# Patient Record
Sex: Male | Born: 1965 | Race: Black or African American | Hispanic: No | Marital: Single | Smoking: Light tobacco smoker
Health system: Southern US, Community
[De-identification: ages and names within clinical notes are randomized; demographics above are authoritative.]

## PROBLEM LIST (undated history)

## (undated) DIAGNOSIS — E119 Type 2 diabetes mellitus without complications: Secondary | ICD-10-CM

## (undated) DIAGNOSIS — H9191 Unspecified hearing loss, right ear: Secondary | ICD-10-CM

## (undated) DIAGNOSIS — J439 Emphysema, unspecified: Secondary | ICD-10-CM

## (undated) DIAGNOSIS — I639 Cerebral infarction, unspecified: Secondary | ICD-10-CM

## (undated) DIAGNOSIS — I2510 Atherosclerotic heart disease of native coronary artery without angina pectoris: Secondary | ICD-10-CM

## (undated) DIAGNOSIS — B0689 Other rubella complications: Secondary | ICD-10-CM

## (undated) DIAGNOSIS — I219 Acute myocardial infarction, unspecified: Secondary | ICD-10-CM

## (undated) DIAGNOSIS — Z720 Tobacco use: Secondary | ICD-10-CM

## (undated) DIAGNOSIS — R911 Solitary pulmonary nodule: Secondary | ICD-10-CM

## (undated) DIAGNOSIS — M1990 Unspecified osteoarthritis, unspecified site: Secondary | ICD-10-CM

## (undated) DIAGNOSIS — E785 Hyperlipidemia, unspecified: Secondary | ICD-10-CM

## (undated) HISTORY — DX: Solitary pulmonary nodule: R91.1

## (undated) HISTORY — DX: Emphysema, unspecified: J43.9

## (undated) HISTORY — DX: Tobacco use: Z72.0

## (undated) HISTORY — DX: Hyperlipidemia, unspecified: E78.5

---
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
## 2012-10-02 HISTORY — PX: NECK SURGERY: SHX720

## 2012-11-02 HISTORY — PX: CARDIAC CATHETERIZATION: SHX172

## 2013-01-20 DIAGNOSIS — I2510 Atherosclerotic heart disease of native coronary artery without angina pectoris: Secondary | ICD-10-CM | POA: Diagnosis not present

## 2013-01-20 DIAGNOSIS — R079 Chest pain, unspecified: Secondary | ICD-10-CM | POA: Diagnosis not present

## 2013-01-20 DIAGNOSIS — E119 Type 2 diabetes mellitus without complications: Secondary | ICD-10-CM | POA: Diagnosis not present

## 2013-01-20 DIAGNOSIS — I219 Acute myocardial infarction, unspecified: Secondary | ICD-10-CM | POA: Diagnosis not present

## 2013-01-27 DIAGNOSIS — M1710 Unilateral primary osteoarthritis, unspecified knee: Secondary | ICD-10-CM | POA: Diagnosis not present

## 2013-02-25 DIAGNOSIS — M1710 Unilateral primary osteoarthritis, unspecified knee: Secondary | ICD-10-CM | POA: Diagnosis not present

## 2013-03-20 DIAGNOSIS — M129 Arthropathy, unspecified: Secondary | ICD-10-CM | POA: Diagnosis not present

## 2013-03-20 DIAGNOSIS — F17200 Nicotine dependence, unspecified, uncomplicated: Secondary | ICD-10-CM | POA: Diagnosis not present

## 2013-03-20 DIAGNOSIS — E119 Type 2 diabetes mellitus without complications: Secondary | ICD-10-CM | POA: Diagnosis not present

## 2013-03-20 DIAGNOSIS — I2510 Atherosclerotic heart disease of native coronary artery without angina pectoris: Secondary | ICD-10-CM | POA: Diagnosis not present

## 2013-04-08 DIAGNOSIS — M4802 Spinal stenosis, cervical region: Secondary | ICD-10-CM | POA: Diagnosis not present

## 2013-05-02 DIAGNOSIS — L259 Unspecified contact dermatitis, unspecified cause: Secondary | ICD-10-CM | POA: Diagnosis not present

## 2013-05-02 DIAGNOSIS — K5289 Other specified noninfective gastroenteritis and colitis: Secondary | ICD-10-CM | POA: Diagnosis not present

## 2013-05-02 DIAGNOSIS — Z9861 Coronary angioplasty status: Secondary | ICD-10-CM | POA: Diagnosis not present

## 2013-05-02 DIAGNOSIS — E119 Type 2 diabetes mellitus without complications: Secondary | ICD-10-CM | POA: Diagnosis not present

## 2013-05-02 DIAGNOSIS — I2510 Atherosclerotic heart disease of native coronary artery without angina pectoris: Secondary | ICD-10-CM | POA: Diagnosis not present

## 2013-07-21 DIAGNOSIS — I2510 Atherosclerotic heart disease of native coronary artery without angina pectoris: Secondary | ICD-10-CM | POA: Diagnosis not present

## 2013-07-21 DIAGNOSIS — I252 Old myocardial infarction: Secondary | ICD-10-CM | POA: Diagnosis not present

## 2013-07-21 DIAGNOSIS — E119 Type 2 diabetes mellitus without complications: Secondary | ICD-10-CM | POA: Diagnosis not present

## 2013-07-21 DIAGNOSIS — E782 Mixed hyperlipidemia: Secondary | ICD-10-CM | POA: Diagnosis not present

## 2013-08-14 DIAGNOSIS — M4802 Spinal stenosis, cervical region: Secondary | ICD-10-CM | POA: Diagnosis not present

## 2013-08-14 DIAGNOSIS — M5020 Other cervical disc displacement, unspecified cervical region: Secondary | ICD-10-CM | POA: Diagnosis not present

## 2013-10-23 DIAGNOSIS — E782 Mixed hyperlipidemia: Secondary | ICD-10-CM | POA: Diagnosis not present

## 2013-10-23 DIAGNOSIS — I2510 Atherosclerotic heart disease of native coronary artery without angina pectoris: Secondary | ICD-10-CM | POA: Diagnosis not present

## 2013-10-23 DIAGNOSIS — E119 Type 2 diabetes mellitus without complications: Secondary | ICD-10-CM | POA: Diagnosis not present

## 2013-10-23 DIAGNOSIS — R079 Chest pain, unspecified: Secondary | ICD-10-CM | POA: Diagnosis not present

## 2013-11-04 DIAGNOSIS — E119 Type 2 diabetes mellitus without complications: Secondary | ICD-10-CM | POA: Diagnosis not present

## 2013-11-04 DIAGNOSIS — R079 Chest pain, unspecified: Secondary | ICD-10-CM | POA: Diagnosis not present

## 2013-11-04 DIAGNOSIS — E782 Mixed hyperlipidemia: Secondary | ICD-10-CM | POA: Diagnosis not present

## 2013-11-04 DIAGNOSIS — I2510 Atherosclerotic heart disease of native coronary artery without angina pectoris: Secondary | ICD-10-CM | POA: Diagnosis not present

## 2013-11-06 DIAGNOSIS — I2510 Atherosclerotic heart disease of native coronary artery without angina pectoris: Secondary | ICD-10-CM | POA: Diagnosis not present

## 2013-11-06 DIAGNOSIS — E118 Type 2 diabetes mellitus with unspecified complications: Secondary | ICD-10-CM | POA: Diagnosis not present

## 2013-11-06 DIAGNOSIS — E119 Type 2 diabetes mellitus without complications: Secondary | ICD-10-CM | POA: Diagnosis not present

## 2013-11-06 DIAGNOSIS — Z23 Encounter for immunization: Secondary | ICD-10-CM | POA: Diagnosis not present

## 2013-11-06 DIAGNOSIS — Z9861 Coronary angioplasty status: Secondary | ICD-10-CM | POA: Diagnosis not present

## 2013-11-11 DIAGNOSIS — E782 Mixed hyperlipidemia: Secondary | ICD-10-CM | POA: Diagnosis not present

## 2013-11-11 DIAGNOSIS — E119 Type 2 diabetes mellitus without complications: Secondary | ICD-10-CM | POA: Diagnosis not present

## 2013-11-11 DIAGNOSIS — I252 Old myocardial infarction: Secondary | ICD-10-CM | POA: Diagnosis not present

## 2013-11-11 DIAGNOSIS — I2510 Atherosclerotic heart disease of native coronary artery without angina pectoris: Secondary | ICD-10-CM | POA: Diagnosis not present

## 2013-12-11 DIAGNOSIS — M4802 Spinal stenosis, cervical region: Secondary | ICD-10-CM | POA: Diagnosis not present

## 2014-02-13 DIAGNOSIS — M1712 Unilateral primary osteoarthritis, left knee: Secondary | ICD-10-CM | POA: Diagnosis not present

## 2014-02-24 ENCOUNTER — Other Ambulatory Visit (HOSPITAL_COMMUNITY): Payer: Self-pay | Admitting: Orthopedic Surgery

## 2014-03-03 DIAGNOSIS — J439 Emphysema, unspecified: Secondary | ICD-10-CM

## 2014-03-03 DIAGNOSIS — R911 Solitary pulmonary nodule: Secondary | ICD-10-CM

## 2014-03-03 HISTORY — DX: Solitary pulmonary nodule: R91.1

## 2014-03-03 HISTORY — DX: Emphysema, unspecified: J43.9

## 2014-03-04 ENCOUNTER — Other Ambulatory Visit (HOSPITAL_COMMUNITY): Payer: Self-pay | Admitting: *Deleted

## 2014-03-04 NOTE — Pre-Procedure Instructions (Addendum)
Alan Mendoza  03/04/2014   Your procedure is scheduled on:  Tuesday, March 17, 2014 at 1:05 PM.   Report to [facility] Entrance "A" Admitting Office at 11:00 AM.   Call this number if you have problems the morning of surgery: [phone]               Any questions prior to day of surgery, please call [phone] between 8 & 4 PM.   Remember:   Do not eat food or drink liquids after midnight Monday, 03/16/14.   Take these medicines the morning of surgery with A SIP OF WATER: Gabapentin.  Stop Aspirin 03/10/14   Do not wear jewelry.  Do not wear lotions, powders, or cologne. You may wear deodorant.  Men may shave face and neck.  Do not bring valuables to the hospital.  [facility] is not responsible                  for any belongings or valuables.               Contacts, dentures or bridgework may not be worn into surgery.  Leave suitcase in the car. After surgery it may be brought to your room.  For patients admitted to the hospital, discharge time is determined by your                treatment team.    Special Instructions: [place] - Preparing for Surgery  Before surgery, you can play an important role.  Because skin is not sterile, your skin needs to be as free of germs as possible.  You can reduce the number of germs on you skin by washing with CHG (chlorahexidine gluconate) soap before surgery.  CHG is an antiseptic cleaner which kills germs and bonds with the skin to continue killing germs even after washing.  Please DO NOT use if you have an allergy to CHG or antibacterial soaps.  If your skin becomes reddened/irritated stop using the CHG and inform your nurse when you arrive at Short Stay.  Do not shave (including legs and underarms) for at least 48 hours prior to the first CHG shower.  You may shave your face.  Please follow these instructions carefully:   1.  Shower with CHG Soap the night before surgery and the                                morning of  Surgery.  2.  If you choose to wash your hair, wash your hair first as usual with your       normal shampoo.  3.  After you shampoo, rinse your hair and body thoroughly to remove the                      Shampoo.  4.  Use CHG as you would any other liquid soap.  You can apply chg directly       to the skin and wash gently with scrungie or a clean washcloth.  5.  Apply the CHG Soap to your body ONLY FROM THE NECK DOWN.        Do not use on open wounds or open sores.  Avoid contact with your eyes, ears, mouth and genitals (private parts).  Wash genitals (private parts) with your normal soap.  6.  Wash thoroughly, paying special attention to the area where your surgery  will be performed.  7.  Thoroughly rinse your body with warm water from the neck down.  8.  DO NOT shower/wash with your normal soap after using and rinsing off       the CHG Soap.  9.  Pat yourself dry with a clean towel.            10.  Wear clean pajamas.            11.  Place clean sheets on your bed the night of your first shower and do not        sleep with pets.  Day of Surgery  Do not apply any lotions the morning of surgery.  Please wear clean clothes to the hospital.     Please read over the following fact sheets that you were given: Pain Booklet, Coughing and Deep Breathing, Blood Transfusion Information, MRSA Information and Surgical Site Infection Prevention

## 2014-03-05 ENCOUNTER — Encounter (HOSPITAL_COMMUNITY)
Admission: RE | Admit: 2014-03-05 | Discharge: 2014-03-05 | Disposition: A | Discharge status: Home / Self Care | Payer: Medicare Other | Source: Ambulatory Visit | Attending: Orthopedic Surgery | Admitting: Orthopedic Surgery

## 2014-03-05 ENCOUNTER — Encounter (HOSPITAL_COMMUNITY): Payer: Self-pay

## 2014-03-05 DIAGNOSIS — F17200 Nicotine dependence, unspecified, uncomplicated: Secondary | ICD-10-CM | POA: Insufficient documentation

## 2014-03-05 DIAGNOSIS — I517 Cardiomegaly: Secondary | ICD-10-CM | POA: Diagnosis not present

## 2014-03-05 DIAGNOSIS — Z8673 Personal history of transient ischemic attack (TIA), and cerebral infarction without residual deficits: Secondary | ICD-10-CM | POA: Insufficient documentation

## 2014-03-05 DIAGNOSIS — M179 Osteoarthritis of knee, unspecified: Secondary | ICD-10-CM | POA: Diagnosis not present

## 2014-03-05 DIAGNOSIS — I2510 Atherosclerotic heart disease of native coronary artery without angina pectoris: Secondary | ICD-10-CM | POA: Diagnosis not present

## 2014-03-05 DIAGNOSIS — I252 Old myocardial infarction: Secondary | ICD-10-CM | POA: Diagnosis not present

## 2014-03-05 DIAGNOSIS — Z0183 Encounter for blood typing: Secondary | ICD-10-CM | POA: Insufficient documentation

## 2014-03-05 DIAGNOSIS — H9190 Unspecified hearing loss, unspecified ear: Secondary | ICD-10-CM | POA: Diagnosis not present

## 2014-03-05 DIAGNOSIS — Z01812 Encounter for preprocedural laboratory examination: Secondary | ICD-10-CM | POA: Insufficient documentation

## 2014-03-05 DIAGNOSIS — R918 Other nonspecific abnormal finding of lung field: Secondary | ICD-10-CM | POA: Diagnosis not present

## 2014-03-05 DIAGNOSIS — Z01818 Encounter for other preprocedural examination: Secondary | ICD-10-CM | POA: Diagnosis not present

## 2014-03-05 DIAGNOSIS — E119 Type 2 diabetes mellitus without complications: Secondary | ICD-10-CM | POA: Insufficient documentation

## 2014-03-05 HISTORY — DX: Other rubella complications: B06.89

## 2014-03-05 HISTORY — DX: Cerebral infarction, unspecified: I63.9

## 2014-03-05 HISTORY — DX: Atherosclerotic heart disease of native coronary artery without angina pectoris: I25.10

## 2014-03-05 HISTORY — DX: Type 2 diabetes mellitus without complications: E11.9

## 2014-03-05 HISTORY — DX: Acute myocardial infarction, unspecified: I21.9

## 2014-03-05 HISTORY — DX: Unspecified hearing loss, right ear: H91.91

## 2014-03-05 HISTORY — DX: Unspecified osteoarthritis, unspecified site: M19.90

## 2014-03-05 LAB — URINE MICROSCOPIC-ADD ON

## 2014-03-05 LAB — CBC
HEMATOCRIT: 48.8 % (ref 39.0–52.0)
Hemoglobin: 15.9 g/dL (ref 13.0–17.0)
MCH: 28.8 pg (ref 26.0–34.0)
MCHC: 32.6 g/dL (ref 30.0–36.0)
MCV: 88.2 fL (ref 78.0–100.0)
PLATELETS: 242 10*3/uL (ref 150–400)
RBC: 5.53 MIL/uL (ref 4.22–5.81)
RDW: 13.2 % (ref 11.5–15.5)
WBC: 6 10*3/uL (ref 4.0–10.5)

## 2014-03-05 LAB — ABO/RH: ABO/RH(D): A POS

## 2014-03-05 LAB — BASIC METABOLIC PANEL
ANION GAP: 5 (ref 5–15)
BUN: 16 mg/dL (ref 6–23)
CO2: 30 mmol/L (ref 19–32)
Calcium: 9.5 mg/dL (ref 8.4–10.5)
Chloride: 108 mmol/L (ref 96–112)
Creatinine, Ser: 0.92 mg/dL (ref 0.50–1.35)
GFR calc Af Amer: >90 mL/min (ref >90)
Glucose, Bld: 99 mg/dL (ref 70–99)
Potassium: 4.3 mmol/L (ref 3.5–5.1)
Sodium: 143 mmol/L (ref 135–145)

## 2014-03-05 LAB — TYPE AND SCREEN
ABO/RH(D): A POS
ANTIBODY SCREEN: NEGATIVE

## 2014-03-05 LAB — SURGICAL PCR SCREEN
MRSA, PCR: NEGATIVE
Staphylococcus aureus: NEGATIVE

## 2014-03-05 LAB — URINALYSIS, ROUTINE W REFLEX MICROSCOPIC
GLUCOSE, UA: NEGATIVE mg/dL
Hgb urine dipstick: NEGATIVE
Ketones, ur: 15 mg/dL — AB
Nitrite: NEGATIVE
PH: 5 (ref 5.0–8.0)
Protein, ur: NEGATIVE mg/dL
Specific Gravity, Urine: 1.033 — ABNORMAL HIGH (ref 1.005–1.030)
Urobilinogen, UA: 0.2 mg/dL (ref 0.0–1.0)

## 2014-03-05 LAB — APTT: APTT: 31 s (ref 24–37)

## 2014-03-05 LAB — PROTIME-INR
INR: 1 (ref 0.00–1.49)
PROTHROMBIN TIME: 13.3 s (ref 11.6–15.2)

## 2014-03-05 MED ORDER — CHLORHEXIDINE GLUCONATE 4 % EX LIQD: 60.0000 mL | Freq: Once | CUTANEOUS | Status: DC

## 2014-03-06 LAB — URINE CULTURE: Colony Count: 4000

## 2014-03-06 NOTE — Progress Notes (Addendum)
Anesthesia Chart Review:  Pt is 49 year old male scheduled for L total knee arthroplasty on 03/17/2014 with Dr. Marlou Sa.   Cardiologist is Dr. Emeline General in [place], [place].   [place] includes: CAD (DES to CFX), MI (2014), stroke, DM. Deaf. Current smoker. BMI 22.6  Preoperative labs reviewed.    Chest x-ray reviewed.  1. Mild right perihilar infiltrate with slight nodularity, findings most consistent pneumonia. Follow-up chest x-ray demonstrates a suggested. 2. Cardiomegaly, no pulmonary venous congestion. Coronary artery disease appear  Called and left voicemail for Kim in Dr. Randel Pigg office about likely pneumonia finding on CXR.   EKG 10/23/2013: sinus rhythm. Moderate inferior repolarization disturbance, consider ischemia or LV overload.   Stress test 11/04/2013:  -clinically negative for chest pain -ECG negative for ischemia -adequate stress response -SPECT image consistent with normal study (no fixed or transient myocardial perfusion defect suggestive of ischemia or scar) -LV dimension are normal, post stress LVEF is 70% without any wall motion abnormalities  Echo 10/06/2012: -LV EF is normal (55-60%). Normal LV diastolic function.  -There is no pericardial effusion.  -Moderate to severe mitral regurgitation.   Cardiac cath 10/07/2012: -single vessel CAD (mid CFX with 90% stenosis) -successful intravascular US guided PCI of mid L CFX with DES  If no changes, I anticipate pt can proceed with surgery as scheduled.   Pt has cardiac clearance from Dr. Posey Pronto.   Willeen Cass, FNP-BC [facility] Short Stay Surgical Center/Anesthesiology Phone: [phone] 03/06/2014 5:06 PM  Addendum: I received a phone call from Maudie Mercury at Dr. Randel Pigg office.  He ordered a chest CT to evaluate for PNA.  CT of the chest was done 03/12/14 and showed: 1. Emphysema. 2. 4 mm right lower lobe nodule. Given risk factors for bronchogenic carcinoma, follow-up chest CT at 1 year is recommended. This recommendation  follows the consensus statement: Guidelines for Management of Small Pulmonary Nodules Detected on CT Scans: A Statement from the [place] as published in Radiology 2005; 237:395-400.  Dr. Marlou Sa plans to proceed.  Instructions regarding arrangements for lung nodule follow-up per Dr. Marlou Sa.  [facility] Short Stay Center/Anesthesiology Phone [phone] 03/13/2014 9:19 AM

## 2014-03-10 ENCOUNTER — Other Ambulatory Visit: Payer: Self-pay | Admitting: Orthopedic Surgery

## 2014-03-10 DIAGNOSIS — J690 Pneumonitis due to inhalation of food and vomit: Secondary | ICD-10-CM

## 2014-03-12 ENCOUNTER — Ambulatory Visit
Admission: RE | Admit: 2014-03-12 | Discharge: 2014-03-12 | Disposition: A | Discharge status: Home / Self Care | Payer: Medicare Other | Source: Ambulatory Visit | Attending: Orthopedic Surgery | Admitting: Orthopedic Surgery

## 2014-03-12 DIAGNOSIS — J439 Emphysema, unspecified: Secondary | ICD-10-CM | POA: Diagnosis not present

## 2014-03-12 DIAGNOSIS — J690 Pneumonitis due to inhalation of food and vomit: Secondary | ICD-10-CM

## 2014-03-16 MED ORDER — CHLORHEXIDINE GLUCONATE 4 % EX LIQD
60.0000 mL | Freq: Once | CUTANEOUS | Status: DC
  Filled 2014-03-16: qty 60

## 2014-03-16 MED ORDER — CEFAZOLIN SODIUM-DEXTROSE 2-3 GM-% IV SOLR
2.0000 g | INTRAVENOUS | Status: AC
  Administered 2014-03-17: 2 g via INTRAVENOUS
  Filled 2014-03-16: qty 50

## 2014-03-16 NOTE — H&P (Signed)
TOTAL KNEE ADMISSION H&P  Patient is being admitted for left total knee arthroplasty.  Subjective:  Chief Complaint:left knee pain.  HPI: Alan Mendoza, 49 y.o. male, has a history of pain and functional disability in the left knee due to arthritis and has failed non-surgical conservative treatments for greater than 12 weeks to includeNSAID's and/or analgesics, corticosteriod injections, use of assistive devices and activity modification.  Onset of symptoms was gradual, starting >10 years ago with gradually worsening course since that time. The patient noted prior procedures on the knee to include  arthroscopy and menisectomy on the left knee(s).  Patient currently rates pain in the left knee(s) at 10 out of 10 with activity. Patient has night pain, worsening of pain with activity and weight bearing, pain that interferes with activities of daily living, pain with passive range of motion, crepitus and joint swelling.  Patient has evidence of subchondral sclerosis, periarticular osteophytes and joint space narrowing by imaging studies. This patient has had Advair difficult time with the left knee due to increasing pain. His quality of life has been significantly diminished due to the pain in his left knee.. There is no active infection. He has had extensive cardiac workup had prior location of medical care. Cardiac risk stratification has been performed within the past several months in anticipation of eventual total knee replacement.  There are no active problems to display for this patient.  Past Medical History  Diagnosis Date  . Coronary artery disease   . Myocardial infarction   . Stroke   . Diabetes mellitus without complication     not on meds  . Arthritis   . 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 Knee arthroscopy Left 1984  . Neck surgery    . Cardiac catheterization      stent placement    No prescriptions prior to admission    No Known Allergies  History  Substance Use Topics  . Smoking status: Current Every Day Smoker -- 0.50 packs/day for 33 years    Types: Cigarettes  . Smokeless tobacco: Never Used  . Alcohol Use: No    No family history on file.   Review of Systems  Constitutional: Negative.   HENT: Negative.   Eyes: Negative.   Respiratory: Negative.   Cardiovascular: Negative.   Gastrointestinal: Negative.   Genitourinary: Negative.   Musculoskeletal: Positive for joint pain.  Skin: Negative.   Neurological: Negative.   Endo/Heme/Allergies: Negative.   Psychiatric/Behavioral: Negative.     Objective:  Physical Exam  Constitutional: He appears well-developed.  HENT:  Head: Normocephalic.  Eyes: Pupils are equal, round, and reactive to light.  Neck: Normal range of motion.  Cardiovascular: Normal rate.   Respiratory: Effort normal.  Neurological: He is alert.  Skin: Skin is warm.  Psychiatric: He has a normal mood and affect.   examination the left knee demonstrates 5-7 flexion contracture medial and lateral joint line tenderness extensor mechanism is intact there is no groin pain internal extra rotation of the leg skin is intact in the left knee region pedal pulses palpable ankle dorsi and plantar flexion is intact no other masses lymphadenopathy or skin changes noted in the left knee region  Vital signs in last 24 hours:    Labs:   There is no height or weight on file to calculate BMI.   Imaging Review Plain radiographs demonstrate severe degenerative joint disease of the left knee(s). The overall  alignment ismild varus. The bone quality appears to be good for age and reported activity level.  Assessment/Plan:  End stage arthritis, left knee   The patient history, physical examination, clinical judgment of the provider and imaging studies are consistent with end stage degenerative joint disease of the left knee(s) and total knee arthroplasty is deemed medically necessary.  The treatment options including medical management, injection therapy arthroscopy and arthroplasty were discussed at length. The risks and benefits of total knee arthroplasty were presented and reviewed. The risks due to aseptic loosening, infection, stiffness, patella tracking problems, thromboembolic complications and other imponderables were discussed. The patient acknowledged the explanation, agreed to proceed with the plan and consent was signed. Patient is being admitted for inpatient treatment for surgery, pain control, PT, OT, prophylactic antibiotics, VTE prophylaxis, progressive ambulation and ADL's and discharge planning. The patient is planning to be discharged to skilled nursing facility

## 2014-03-17 ENCOUNTER — Encounter (HOSPITAL_COMMUNITY): Payer: Self-pay | Admitting: *Deleted

## 2014-03-17 ENCOUNTER — Inpatient Hospital Stay (HOSPITAL_COMMUNITY): Payer: Medicare Other | Admitting: Vascular Surgery

## 2014-03-17 ENCOUNTER — Encounter (HOSPITAL_COMMUNITY)
Admission: RE | Disposition: A | Discharge status: Skilled Nursing Facility | Payer: Self-pay | Source: Ambulatory Visit | Attending: Orthopedic Surgery

## 2014-03-17 ENCOUNTER — Inpatient Hospital Stay (HOSPITAL_COMMUNITY)
Admission: RE | Admit: 2014-03-17 | Discharge: 2014-03-20 | DRG: 470 | Disposition: A | Discharge status: Skilled Nursing Facility | Payer: Medicare Other | Source: Ambulatory Visit | Attending: Orthopedic Surgery | Admitting: Orthopedic Surgery

## 2014-03-17 ENCOUNTER — Inpatient Hospital Stay (HOSPITAL_COMMUNITY): Payer: Medicare Other | Admitting: Anesthesiology

## 2014-03-17 DIAGNOSIS — Z471 Aftercare following joint replacement surgery: Secondary | ICD-10-CM | POA: Diagnosis not present

## 2014-03-17 DIAGNOSIS — Z955 Presence of coronary angioplasty implant and graft: Secondary | ICD-10-CM | POA: Diagnosis not present

## 2014-03-17 DIAGNOSIS — M1712 Unilateral primary osteoarthritis, left knee: Principal | ICD-10-CM | POA: Diagnosis present

## 2014-03-17 DIAGNOSIS — Z7901 Long term (current) use of anticoagulants: Secondary | ICD-10-CM | POA: Diagnosis not present

## 2014-03-17 DIAGNOSIS — Z8673 Personal history of transient ischemic attack (TIA), and cerebral infarction without residual deficits: Secondary | ICD-10-CM | POA: Diagnosis not present

## 2014-03-17 DIAGNOSIS — R278 Other lack of coordination: Secondary | ICD-10-CM | POA: Diagnosis not present

## 2014-03-17 DIAGNOSIS — F17210 Nicotine dependence, cigarettes, uncomplicated: Secondary | ICD-10-CM | POA: Diagnosis present

## 2014-03-17 DIAGNOSIS — I252 Old myocardial infarction: Secondary | ICD-10-CM

## 2014-03-17 DIAGNOSIS — M1710 Unilateral primary osteoarthritis, unspecified knee: Secondary | ICD-10-CM | POA: Diagnosis present

## 2014-03-17 DIAGNOSIS — G4700 Insomnia, unspecified: Secondary | ICD-10-CM | POA: Diagnosis not present

## 2014-03-17 DIAGNOSIS — H9191 Unspecified hearing loss, right ear: Secondary | ICD-10-CM | POA: Diagnosis present

## 2014-03-17 DIAGNOSIS — M792 Neuralgia and neuritis, unspecified: Secondary | ICD-10-CM | POA: Diagnosis not present

## 2014-03-17 DIAGNOSIS — M25562 Pain in left knee: Secondary | ICD-10-CM | POA: Diagnosis not present

## 2014-03-17 DIAGNOSIS — Z79899 Other long term (current) drug therapy: Secondary | ICD-10-CM | POA: Diagnosis not present

## 2014-03-17 DIAGNOSIS — Z7982 Long term (current) use of aspirin: Secondary | ICD-10-CM | POA: Diagnosis not present

## 2014-03-17 DIAGNOSIS — I2510 Atherosclerotic heart disease of native coronary artery without angina pectoris: Secondary | ICD-10-CM | POA: Diagnosis present

## 2014-03-17 DIAGNOSIS — S83105A Unspecified dislocation of left knee, initial encounter: Secondary | ICD-10-CM | POA: Diagnosis not present

## 2014-03-17 DIAGNOSIS — E119 Type 2 diabetes mellitus without complications: Secondary | ICD-10-CM | POA: Diagnosis present

## 2014-03-17 DIAGNOSIS — R2681 Unsteadiness on feet: Secondary | ICD-10-CM | POA: Diagnosis not present

## 2014-03-17 DIAGNOSIS — D62 Acute posthemorrhagic anemia: Secondary | ICD-10-CM | POA: Diagnosis not present

## 2014-03-17 DIAGNOSIS — Z96652 Presence of left artificial knee joint: Secondary | ICD-10-CM | POA: Diagnosis not present

## 2014-03-17 DIAGNOSIS — M179 Osteoarthritis of knee, unspecified: Secondary | ICD-10-CM | POA: Diagnosis present

## 2014-03-17 HISTORY — PX: TOTAL KNEE ARTHROPLASTY: SHX125

## 2014-03-17 LAB — GLUCOSE, CAPILLARY: GLUCOSE-CAPILLARY: 115 mg/dL — AB (ref 70–99)

## 2014-03-17 SURGERY — ARTHROPLASTY, KNEE, TOTAL
Anesthesia: Monitor Anesthesia Care | Site: Knee | Laterality: Left

## 2014-03-17 MED ORDER — FENTANYL CITRATE 0.05 MG/ML IJ SOLN: 50.0000 ug | INTRAMUSCULAR | Status: DC | PRN

## 2014-03-17 MED ORDER — ONDANSETRON HCL 4 MG/2ML IJ SOLN: 4.0000 mg | Freq: Four times a day (QID) | INTRAMUSCULAR | Status: DC | PRN

## 2014-03-17 MED ORDER — PROPOFOL 10 MG/ML IV BOLUS
INTRAVENOUS | Status: AC
  Filled 2014-03-17: qty 20

## 2014-03-17 MED ORDER — PROPOFOL 10 MG/ML IV BOLUS
INTRAVENOUS | Status: DC | PRN
  Administered 2014-03-17 (×3): 10 mg via INTRAVENOUS

## 2014-03-17 MED ORDER — ONDANSETRON HCL 4 MG/2ML IJ SOLN: 4.0000 mg | Freq: Once | INTRAMUSCULAR | Status: DC | PRN

## 2014-03-17 MED ORDER — METHOCARBAMOL 1000 MG/10ML IJ SOLN
500.0000 mg | Freq: Four times a day (QID) | INTRAVENOUS | Status: DC | PRN
  Filled 2014-03-17: qty 5

## 2014-03-17 MED ORDER — METHOCARBAMOL 500 MG PO TABS
500.0000 mg | ORAL_TABLET | Freq: Four times a day (QID) | ORAL | Status: DC | PRN
  Administered 2014-03-17 – 2014-03-19 (×6): 500 mg via ORAL
  Filled 2014-03-17 (×6): qty 1

## 2014-03-17 MED ORDER — POTASSIUM CHLORIDE IN NACL 20-0.9 MEQ/L-% IV SOLN
INTRAVENOUS | Status: AC
  Administered 2014-03-17: 22:00:00 via INTRAVENOUS
  Filled 2014-03-17 (×2): qty 1000

## 2014-03-17 MED ORDER — MORPHINE SULFATE 4 MG/ML IJ SOLN
INTRAMUSCULAR | Status: DC | PRN
  Administered 2014-03-17: 8 mg via INTRAVENOUS

## 2014-03-17 MED ORDER — MORPHINE SULFATE 4 MG/ML IJ SOLN
INTRAMUSCULAR | Status: AC
  Filled 2014-03-17: qty 2

## 2014-03-17 MED ORDER — PHENYLEPHRINE 40 MCG/ML (10ML) SYRINGE FOR IV PUSH (FOR BLOOD PRESSURE SUPPORT)
PREFILLED_SYRINGE | INTRAVENOUS | Status: AC
  Filled 2014-03-17: qty 10

## 2014-03-17 MED ORDER — OXYCODONE HCL 5 MG PO TABS: 5.0000 mg | ORAL_TABLET | Freq: Once | ORAL | Status: DC | PRN

## 2014-03-17 MED ORDER — PHENYLEPHRINE HCL 10 MG/ML IJ SOLN
INTRAMUSCULAR | Status: DC | PRN
  Administered 2014-03-17: 80 ug via INTRAVENOUS

## 2014-03-17 MED ORDER — METOCLOPRAMIDE HCL 5 MG/ML IJ SOLN: 5.0000 mg | Freq: Three times a day (TID) | INTRAMUSCULAR | Status: DC | PRN

## 2014-03-17 MED ORDER — MIDAZOLAM HCL 2 MG/2ML IJ SOLN
INTRAMUSCULAR | Status: AC
  Filled 2014-03-17: qty 2

## 2014-03-17 MED ORDER — BUPIVACAINE-EPINEPHRINE (PF) 0.5% -1:200000 IJ SOLN
INTRAMUSCULAR | Status: AC
  Filled 2014-03-17: qty 30

## 2014-03-17 MED ORDER — SODIUM CHLORIDE 0.9 % IJ SOLN
INTRAMUSCULAR | Status: AC
  Filled 2014-03-17: qty 10

## 2014-03-17 MED ORDER — ONDANSETRON HCL 4 MG PO TABS: 4.0000 mg | ORAL_TABLET | Freq: Four times a day (QID) | ORAL | Status: DC | PRN

## 2014-03-17 MED ORDER — FENTANYL CITRATE 0.05 MG/ML IJ SOLN
INTRAMUSCULAR | Status: AC
  Administered 2014-03-17: 100 ug
  Filled 2014-03-17: qty 2

## 2014-03-17 MED ORDER — LACTATED RINGERS IV SOLN
INTRAVENOUS | Status: DC
  Administered 2014-03-17: 12:00:00 via INTRAVENOUS

## 2014-03-17 MED ORDER — SODIUM CHLORIDE 0.9 % IJ SOLN
INTRAMUSCULAR | Status: DC | PRN
  Administered 2014-03-17: 40 mL via INTRAVENOUS

## 2014-03-17 MED ORDER — EPHEDRINE SULFATE 50 MG/ML IJ SOLN
INTRAMUSCULAR | Status: DC | PRN
  Administered 2014-03-17: 5 mg via INTRAVENOUS
  Administered 2014-03-17: 10 mg via INTRAVENOUS
  Administered 2014-03-17: 5 mg via INTRAVENOUS

## 2014-03-17 MED ORDER — EPHEDRINE SULFATE 50 MG/ML IJ SOLN
INTRAMUSCULAR | Status: AC
  Filled 2014-03-17: qty 1

## 2014-03-17 MED ORDER — LACTATED RINGERS IV SOLN
INTRAVENOUS | Status: DC | PRN
  Administered 2014-03-17 (×2): via INTRAVENOUS

## 2014-03-17 MED ORDER — GABAPENTIN 100 MG PO CAPS
100.0000 mg | ORAL_CAPSULE | Freq: Two times a day (BID) | ORAL | Status: DC
  Administered 2014-03-17 – 2014-03-20 (×6): 100 mg via ORAL
  Filled 2014-03-17 (×6): qty 1

## 2014-03-17 MED ORDER — MORPHINE SULFATE 4 MG/ML IJ SOLN
4.0000 mg | INTRAMUSCULAR | Status: DC | PRN
Start: 2014-03-17 — End: 2014-03-20
  Administered 2014-03-18 (×5): 4 mg via INTRAVENOUS
  Filled 2014-03-17 (×5): qty 1

## 2014-03-17 MED ORDER — BUPIVACAINE LIPOSOME 1.3 % IJ SUSP
20.0000 mL | INTRAMUSCULAR | Status: AC
  Administered 2014-03-17: 20 mL
  Filled 2014-03-17: qty 20

## 2014-03-17 MED ORDER — HYDROMORPHONE HCL 1 MG/ML IJ SOLN: 0.2500 mg | INTRAMUSCULAR | Status: DC | PRN

## 2014-03-17 MED ORDER — LIDOCAINE HCL (CARDIAC) 20 MG/ML IV SOLN
INTRAVENOUS | Status: AC
  Filled 2014-03-17: qty 5

## 2014-03-17 MED ORDER — MENTHOL 3 MG MT LOZG: 1.0000 | LOZENGE | OROMUCOSAL | Status: DC | PRN

## 2014-03-17 MED ORDER — OXYCODONE HCL 5 MG/5ML PO SOLN: 5.0000 mg | Freq: Once | ORAL | Status: DC | PRN

## 2014-03-17 MED ORDER — METOCLOPRAMIDE HCL 10 MG PO TABS: 5.0000 mg | ORAL_TABLET | Freq: Three times a day (TID) | ORAL | Status: DC | PRN

## 2014-03-17 MED ORDER — MIDAZOLAM HCL 2 MG/2ML IJ SOLN
INTRAMUSCULAR | Status: AC
  Administered 2014-03-17: 2 mg
  Filled 2014-03-17: qty 2

## 2014-03-17 MED ORDER — MIDAZOLAM HCL 2 MG/2ML IJ SOLN: 1.0000 mg | INTRAMUSCULAR | Status: DC | PRN

## 2014-03-17 MED ORDER — RIVAROXABAN 10 MG PO TABS
10.0000 mg | ORAL_TABLET | Freq: Every day | ORAL | Status: DC
  Administered 2014-03-18 – 2014-03-20 (×3): 10 mg via ORAL
  Filled 2014-03-17 (×3): qty 1

## 2014-03-17 MED ORDER — CEFAZOLIN SODIUM 1-5 GM-% IV SOLN
1.0000 g | Freq: Three times a day (TID) | INTRAVENOUS | Status: AC
  Administered 2014-03-17 – 2014-03-18 (×2): 1 g via INTRAVENOUS
  Filled 2014-03-17 (×2): qty 50

## 2014-03-17 MED ORDER — BUPIVACAINE HCL (PF) 0.25 % IJ SOLN
INTRAMUSCULAR | Status: AC
  Filled 2014-03-17: qty 30

## 2014-03-17 MED ORDER — PROPOFOL INFUSION 10 MG/ML OPTIME
INTRAVENOUS | Status: DC | PRN
  Administered 2014-03-17: 75 ug/kg/min via INTRAVENOUS

## 2014-03-17 MED ORDER — OXYCODONE HCL 5 MG PO TABS
5.0000 mg | ORAL_TABLET | ORAL | Status: DC | PRN
  Administered 2014-03-17 – 2014-03-19 (×12): 10 mg via ORAL
  Filled 2014-03-17 (×12): qty 2

## 2014-03-17 MED ORDER — FENTANYL CITRATE 0.05 MG/ML IJ SOLN
INTRAMUSCULAR | Status: DC | PRN
  Administered 2014-03-17: 50 ug via INTRAVENOUS

## 2014-03-17 MED ORDER — PNEUMOCOCCAL VAC POLYVALENT 25 MCG/0.5ML IJ INJ
0.5000 mL | INJECTION | INTRAMUSCULAR | Status: AC
  Administered 2014-03-18: 0.5 mL via INTRAMUSCULAR

## 2014-03-17 MED ORDER — CLONIDINE HCL (ANALGESIA) 100 MCG/ML EP SOLN
150.0000 ug | Freq: Once | EPIDURAL | Status: AC
  Administered 2014-03-17: 1 mL via INTRA_ARTICULAR
  Filled 2014-03-17: qty 1.5

## 2014-03-17 MED ORDER — GLYCOPYRROLATE 0.2 MG/ML IJ SOLN
INTRAMUSCULAR | Status: AC
  Filled 2014-03-17: qty 2

## 2014-03-17 MED ORDER — BUPIVACAINE-EPINEPHRINE 0.5% -1:200000 IJ SOLN
INTRAMUSCULAR | Status: DC | PRN
  Administered 2014-03-17: 10 mL

## 2014-03-17 MED ORDER — 0.9 % SODIUM CHLORIDE (POUR BTL) OPTIME
TOPICAL | Status: DC | PRN
  Administered 2014-03-17: 1000 mL

## 2014-03-17 MED ORDER — MIDAZOLAM HCL 5 MG/5ML IJ SOLN
INTRAMUSCULAR | Status: DC | PRN
  Administered 2014-03-17 (×2): 1 mg via INTRAVENOUS

## 2014-03-17 MED ORDER — FENTANYL CITRATE 0.05 MG/ML IJ SOLN
INTRAMUSCULAR | Status: AC
  Filled 2014-03-17: qty 5

## 2014-03-17 MED ORDER — ACETAMINOPHEN 325 MG PO TABS
650.0000 mg | ORAL_TABLET | Freq: Four times a day (QID) | ORAL | Status: DC | PRN
Start: 2014-03-17 — End: 2014-03-20
  Administered 2014-03-20: 650 mg via ORAL
  Filled 2014-03-17: qty 2

## 2014-03-17 MED ORDER — MEPERIDINE HCL 25 MG/ML IJ SOLN: 6.2500 mg | INTRAMUSCULAR | Status: DC | PRN

## 2014-03-17 MED ORDER — GLYCOPYRROLATE 0.2 MG/ML IJ SOLN
INTRAMUSCULAR | Status: DC | PRN
  Administered 2014-03-17 (×3): 0.1 mg via INTRAVENOUS

## 2014-03-17 MED ORDER — ACETAMINOPHEN 650 MG RE SUPP
650.0000 mg | Freq: Four times a day (QID) | RECTAL | Status: DC | PRN
Start: 2014-03-17 — End: 2014-03-20

## 2014-03-17 MED ORDER — PHENOL 1.4 % MT LIQD: 1.0000 | OROMUCOSAL | Status: DC | PRN

## 2014-03-17 SURGICAL SUPPLY — 72 items
BANDAGE ELASTIC 4 VELCRO ST LF (GAUZE/BANDAGES/DRESSINGS) ×3 IMPLANT
BANDAGE ESMARK 6X9 LF (GAUZE/BANDAGES/DRESSINGS) ×1 IMPLANT
BLADE SAG 18X100X1.27 (BLADE) ×6 IMPLANT
BLADE SAW SGTL 13.0X1.19X90.0M (BLADE) IMPLANT
BNDG COHESIVE 6X5 TAN STRL LF (GAUZE/BANDAGES/DRESSINGS) ×3 IMPLANT
BNDG ELASTIC 6X10 VLCR STRL LF (GAUZE/BANDAGES/DRESSINGS) ×3 IMPLANT
BNDG ESMARK 6X9 LF (GAUZE/BANDAGES/DRESSINGS) ×3
BOWL SMART MIX CTS (DISPOSABLE) IMPLANT
CAPT KNEE TRIATH TK-4 ×3 IMPLANT
CEMENT BONE SIMPLEX SPEEDSET (Cement) IMPLANT
COVER SURGICAL LIGHT HANDLE (MISCELLANEOUS) ×3 IMPLANT
CUFF TOURNIQUET SINGLE 34IN LL (TOURNIQUET CUFF) ×3 IMPLANT
DRAPE IMP U-DRAPE 54X76 (DRAPES) ×3 IMPLANT
DRAPE INCISE IOBAN 66X45 STRL (DRAPES) IMPLANT
DRAPE ORTHO SPLIT 77X108 STRL (DRAPES) ×6
DRAPE SURG ORHT 6 SPLT 77X108 (DRAPES) ×3 IMPLANT
DRAPE U-SHAPE 47X51 STRL (DRAPES) ×3 IMPLANT
DRSG AQUACEL AG ADV 3.5X10 (GAUZE/BANDAGES/DRESSINGS) ×3 IMPLANT
DRSG PAD ABDOMINAL 8X10 ST (GAUZE/BANDAGES/DRESSINGS) ×6 IMPLANT
DURAPREP 26ML APPLICATOR (WOUND CARE) ×6 IMPLANT
ELECT REM PT RETURN 9FT ADLT (ELECTROSURGICAL) ×3
ELECTRODE REM PT RTRN 9FT ADLT (ELECTROSURGICAL) ×1 IMPLANT
FACESHIELD WRAPAROUND (MASK) ×3 IMPLANT
GAUZE SPONGE 4X4 12PLY STRL (GAUZE/BANDAGES/DRESSINGS) ×3 IMPLANT
GAUZE XEROFORM 1X8 LF (GAUZE/BANDAGES/DRESSINGS) ×3 IMPLANT
GAUZE XEROFORM 5X9 LF (GAUZE/BANDAGES/DRESSINGS) ×3 IMPLANT
GLOVE BIOGEL PI IND STRL 6.5 (GLOVE) ×1 IMPLANT
GLOVE BIOGEL PI IND STRL 7.5 (GLOVE) ×1 IMPLANT
GLOVE BIOGEL PI IND STRL 8 (GLOVE) ×2 IMPLANT
GLOVE BIOGEL PI INDICATOR 6.5 (GLOVE) ×2
GLOVE BIOGEL PI INDICATOR 7.5 (GLOVE) ×2
GLOVE BIOGEL PI INDICATOR 8 (GLOVE) ×4
GLOVE ECLIPSE 7.0 STRL STRAW (GLOVE) ×6 IMPLANT
GLOVE SURG ORTHO 8.0 STRL STRW (GLOVE) ×6 IMPLANT
GOWN STRL REUS W/ TWL LRG LVL3 (GOWN DISPOSABLE) ×2 IMPLANT
GOWN STRL REUS W/TWL 2XL LVL3 (GOWN DISPOSABLE) ×3 IMPLANT
GOWN STRL REUS W/TWL LRG LVL3 (GOWN DISPOSABLE) ×4
HANDPIECE INTERPULSE COAX TIP (DISPOSABLE)
HOOD PEEL AWAY FACE SHEILD DIS (HOOD) ×9 IMPLANT
IMMOBILIZER KNEE 20 (SOFTGOODS) IMPLANT
IMMOBILIZER KNEE 22 UNIV (SOFTGOODS) ×3 IMPLANT
KIT BASIN OR (CUSTOM PROCEDURE TRAY) ×3 IMPLANT
KIT ROOM TURNOVER OR (KITS) ×3 IMPLANT
MANIFOLD NEPTUNE II (INSTRUMENTS) ×3 IMPLANT
NEEDLE 18GX1X1/2 (RX/OR ONLY) (NEEDLE) ×9 IMPLANT
NEEDLE HYPO 22GX1.5 SAFETY (NEEDLE) ×6 IMPLANT
NEEDLE SPNL 18GX3.5 QUINCKE PK (NEEDLE) ×3 IMPLANT
NS IRRIG 1000ML POUR BTL (IV SOLUTION) ×6 IMPLANT
PACK TOTAL JOINT (CUSTOM PROCEDURE TRAY) ×3 IMPLANT
PACK UNIVERSAL I (CUSTOM PROCEDURE TRAY) ×3 IMPLANT
PAD ARMBOARD 7.5X6 YLW CONV (MISCELLANEOUS) ×6 IMPLANT
PAD CAST 4YDX4 CTTN HI CHSV (CAST SUPPLIES) ×1 IMPLANT
PADDING CAST COTTON 4X4 STRL (CAST SUPPLIES) ×2
PADDING CAST COTTON 6X4 STRL (CAST SUPPLIES) ×9 IMPLANT
RUBBERBAND STERILE (MISCELLANEOUS) IMPLANT
SET HNDPC FAN SPRY TIP SCT (DISPOSABLE) IMPLANT
SPONGE LAP 18X18 X RAY DECT (DISPOSABLE) IMPLANT
STAPLER VISISTAT 35W (STAPLE) ×3 IMPLANT
SUCTION FRAZIER TIP 10 FR DISP (SUCTIONS) ×3 IMPLANT
SUT FIBERWIRE #2 38 REV NDL BL (SUTURE) ×6
SUT VIC AB 0 CTB1 27 (SUTURE) ×9 IMPLANT
SUT VIC AB 1 CT1 27 (SUTURE) ×10
SUT VIC AB 1 CT1 27XBRD ANBCTR (SUTURE) ×5 IMPLANT
SUT VIC AB 2-0 CT1 27 (SUTURE) ×4
SUT VIC AB 2-0 CT1 TAPERPNT 27 (SUTURE) ×2 IMPLANT
SUTURE FIBERWR#2 38 REV NDL BL (SUTURE) ×2 IMPLANT
SYR 30ML LL (SYRINGE) ×9 IMPLANT
SYR TB 1ML LUER SLIP (SYRINGE) ×3 IMPLANT
TOWEL OR 17X24 6PK STRL BLUE (TOWEL DISPOSABLE) ×6 IMPLANT
TOWEL OR 17X26 10 PK STRL BLUE (TOWEL DISPOSABLE) ×12 IMPLANT
TRAY FOLEY CATH 16FRSI W/METER (SET/KITS/TRAYS/PACK) IMPLANT
WATER STERILE IRR 1000ML POUR (IV SOLUTION) ×3 IMPLANT

## 2014-03-17 NOTE — Brief Op Note (Signed)
03/17/2014  5:17 PM  PATIENT:  Alan Mendoza  49 y.o. male  PRE-OPERATIVE DIAGNOSIS:  LEFT KNEE OSTEOARTHRITIS  POST-OPERATIVE DIAGNOSIS:  LEFT KNEE OSTEOARTHRITIS  PROCEDURE:  Procedure(s): TOTAL KNEE ARTHROPLASTY  SURGEON:  Surgeon(s): Meredith Pel, MD  ASSISTANT: Laure Kidney rnfa  ANESTHESIA:   spinal  EBL: 150 ml    Total I/O In: 1000 [I.V.:1000] Out: -   BLOOD ADMINISTERED: none  DRAINS: none   LOCAL MEDICATIONS USED:  exparel marcaine morphine  SPECIMEN:  No Specimen  COUNTS:  YES  TOURNIQUET:   Total Tourniquet Time Documented: Thigh (Left) - 99 minutes Total: Thigh (Left) - 99 minutes   DICTATION: .Other Dictation: Dictation Number [id number]  PLAN OF CARE: Admit to inpatient   PATIENT DISPOSITION:  PACU - hemodynamically stable

## 2014-03-17 NOTE — Interval H&P Note (Signed)
History and Physical Interval Note:  03/17/2014 10:50 AM  Alan Mendoza  has presented today for surgery, with the diagnosis of LEFT KNEE OSTEOARTHRITIS  The various methods of treatment have been discussed with the patient and family. After consideration of risks, benefits and other options for treatment, the patient has consented to  Procedure(s): TOTAL KNEE ARTHROPLASTY (Left) as a surgical intervention .  The patient's history has been reviewed, patient examined, no change in status, stable for surgery.  I have reviewed the patient's chart and labs.  Questions were answered to the patient's satisfaction.     DEAN,GREGORY SCOTT

## 2014-03-17 NOTE — Progress Notes (Signed)
Orthopedic Tech Progress Note Patient Details:  Alan Mendoza [birth year] [id number]  Ortho Devices Ortho Device/Splint Location: footsie roll Ortho Device/Splint Interventions: Criss Alvine 03/17/2014, 6:55 PM

## 2014-03-17 NOTE — Progress Notes (Signed)
Orthopedic Tech Progress Note Patient Details:  Alan Mendoza 25-Sep-1965 [id number]  CPM Left Knee CPM Left Knee: On Left Knee Flexion (Degrees): 40 Left Knee Extension (Degrees): 0 Additional Comments: applied overhead frame   Braulio Bosch 03/17/2014, 6:49 PM

## 2014-03-17 NOTE — Transfer of Care (Signed)
Immediate Anesthesia Transfer of Care Note  Patient: Alan Mendoza  Procedure(s) Performed: Procedure(s): TOTAL KNEE ARTHROPLASTY (Left)  Patient Location: PACU  Anesthesia Type:MAC and Spinal  Level of Consciousness: awake, alert  and oriented  Airway & Oxygen Therapy: Patient Spontanous Breathing and Patient connected to nasal cannula oxygen  Post-op Assessment: Report given to RN and Post -op Vital signs reviewed and stable  Post vital signs: Reviewed and stable  Last Vitals:  Filed Vitals:   03/17/14 1741  BP:   Pulse:   Temp: 36.6 C  Resp: 19    Complications: No apparent anesthesia complications

## 2014-03-17 NOTE — Anesthesia Preprocedure Evaluation (Addendum)
Anesthesia Evaluation  Patient identified by MRN, date of birth, ID band Patient awake    Reviewed: Allergy & Precautions, NPO status , Patient's Chart, lab work & pertinent test results  Airway Mallampati: III  TM Distance: >3 FB     Dental  (+) Edentulous Upper   Pulmonary Current Smoker,  breath sounds clear to auscultation        Cardiovascular + CAD, + Past MI and + Cardiac Stents Rhythm:Regular Rate:Normal     Neuro/Psych    GI/Hepatic   Endo/Other  diabetes  Renal/GU      Musculoskeletal  (+) Arthritis -, Osteoarthritis,    Abdominal   Peds  Hematology   Anesthesia Other Findings   Reproductive/Obstetrics                            Anesthesia Physical Anesthesia Plan  ASA: III  Anesthesia Plan: MAC, Spinal and Regional   Post-op Pain Management:    Induction:   Airway Management Planned:   Additional Equipment:   Intra-op Plan:   Post-operative Plan:   Informed Consent: I have reviewed the patients History and Physical, chart, labs and discussed the procedure including the risks, benefits and alternatives for the proposed anesthesia with the patient or authorized representative who has indicated his/her understanding and acceptance.   Dental advisory given  Plan Discussed with: CRNA, Anesthesiologist and Surgeon  Anesthesia Plan Comments:        Anesthesia Quick Evaluation

## 2014-03-17 NOTE — Anesthesia Procedure Notes (Addendum)
Anesthesia Regional Block:  Adductor canal block  Pre-Anesthetic Checklist: ,, timeout performed, Correct Patient, Correct Site, Correct Laterality, Correct Procedure, Correct Position, site marked, Risks and benefits discussed,  Surgical consent,  Pre-op evaluation,  At surgeon's request and post-op pain management  Laterality: Left  Prep: chloraprep       Needles:   Needle Type: Echogenic Stimulator Needle          Additional Needles:  Procedures: ultrasound guided (picture in chart) Adductor canal block Narrative:  Injection made incrementally with aspirations every 3 mL.  Performed by: Personally  Anesthesiologist: Alfonso Patten  Additional Notes: 12 ml of 0.25% bupivacaine used for the procedure 2mg  of versed and 100 mcg of fentanyl used for the procedure. Patient was following commands through out the procedure   Spinal Patient location during procedure: OR End time: 03/17/2014 2:31 PM Staffing Anesthesiologist: Alfonso Patten Preanesthetic Checklist Completed: patient identified, site marked, surgical consent, pre-op evaluation, timeout performed, IV checked, risks and benefits discussed and monitors and equipment checked Spinal Block Patient position: sitting Prep: ChloraPrep Patient monitoring: heart rate, cardiac monitor, continuous pulse ox and blood pressure Approach: midline Location: L4-5 Injection technique: single-shot Needle Needle type: Pencil-Tip  Needle gauge: 24 G Additional Notes 1.8 ml of 0.75% bupivacaine injected. Patient tolerated the procedure very well

## 2014-03-18 ENCOUNTER — Encounter (HOSPITAL_COMMUNITY): Payer: Self-pay | Admitting: Orthopedic Surgery

## 2014-03-18 DIAGNOSIS — M1712 Unilateral primary osteoarthritis, left knee: Secondary | ICD-10-CM | POA: Diagnosis not present

## 2014-03-18 LAB — CBC
HCT: 36.9 % — ABNORMAL LOW (ref 39.0–52.0)
HEMOGLOBIN: 12 g/dL — AB (ref 13.0–17.0)
MCH: 28.1 pg (ref 26.0–34.0)
MCHC: 32.5 g/dL (ref 30.0–36.0)
MCV: 86.4 fL (ref 78.0–100.0)
PLATELETS: 177 10*3/uL (ref 150–400)
RBC: 4.27 MIL/uL (ref 4.22–5.81)
RDW: 13.1 % (ref 11.5–15.5)
WBC: 6.2 10*3/uL (ref 4.0–10.5)

## 2014-03-18 LAB — BASIC METABOLIC PANEL
Anion gap: 5 (ref 5–15)
BUN: 10 mg/dL (ref 6–23)
CO2: 24 mmol/L (ref 19–32)
Calcium: 8.6 mg/dL (ref 8.4–10.5)
Chloride: 106 mmol/L (ref 96–112)
Creatinine, Ser: 0.78 mg/dL (ref 0.50–1.35)
GFR calc non Af Amer: >90 mL/min (ref >90)
Glucose, Bld: 145 mg/dL — ABNORMAL HIGH (ref 70–99)
POTASSIUM: 4.1 mmol/L (ref 3.5–5.1)
SODIUM: 135 mmol/L (ref 135–145)

## 2014-03-18 LAB — GLUCOSE, CAPILLARY
GLUCOSE-CAPILLARY: 177 mg/dL — AB (ref 70–99)
GLUCOSE-CAPILLARY: 115 mg/dL — AB (ref 70–99)
Glucose-Capillary: 151 mg/dL — ABNORMAL HIGH (ref 70–99)
Glucose-Capillary: 131 mg/dL — ABNORMAL HIGH (ref 70–99)

## 2014-03-18 NOTE — Progress Notes (Signed)
Orthopedic Tech Progress Note Patient Details:  Alan Mendoza [birth year] [id number] On cpm at 7:20 pm comes off at 9:20 pm Patient ID: Alan Mendoza, male   DOB: September 18, 1965, 49 y.o.   MRN: [id number]   Braulio Bosch 03/18/2014, 7:20 PM

## 2014-03-18 NOTE — Progress Notes (Signed)
Subjective: Pt stable - having pain   Objective: Vital signs in last 24 hours: Temp:  [97.8 F (36.6 C)-99.8 F (37.7 C)] 99.8 F (37.7 C) (03/16 0441) Pulse Rate:  [60-84] 73 (03/16 0441) Resp:  [9-23] 15 (03/16 0441) BP: (95-119)/(55-79) 95/55 mmHg (03/16 0441) SpO2:  [92 %-100 %] 92 % (03/16 0441) Weight:  [65.318 kg (144 lb)] 65.318 kg (144 lb) (03/15 1104)  Intake/Output from previous day: 03/15 0701 - 03/16 0700 In: 1500 [I.V.:1500] Out: 1000 [Urine:1000] Intake/Output this shift:    Exam:  Neurovascular intact Sensation intact distally Intact pulses distally Dorsiflexion/Plantar flexion intact  Labs: No results for input(s): HGB in the last 72 hours. No results for input(s): WBC, RBC, HCT, PLT in the last 72 hours. No results for input(s): NA, K, CL, CO2, BUN, CREATININE, GLUCOSE, CALCIUM in the last 72 hours. No results for input(s): LABPT, INR in the last 72 hours.  Assessment/Plan: PT and CPM today may need snf   DEAN,GREGORY SCOTT 03/18/2014, 7:46 AM

## 2014-03-18 NOTE — Plan of Care (Signed)
Problem: Consults Goal: Diagnosis- Total Joint Replacement Primary Total Knee     

## 2014-03-18 NOTE — Evaluation (Signed)
Physical Therapy Evaluation Patient Details Name: Alan Mendoza MRN: [id number] DOB: 1965/10/04 Today's Date: 03/18/2014   History of Present Illness  Pt is a 49 y.o. male s/p Lt TKA. pt is deaf.   Clinical Impression  Pt is s/p Lt TKA POD#1 resulting in the deficits listed below (see PT Problem List). Juliann Pulse, interpreter, utilized.  Pt will benefit from skilled PT to increase their independence and safety with mobility to allow discharge to the venue listed below. Pt currently reports he will have 24/7 (A) from significant other and her son (50 y.o.) upon D/C for ~3-4 days. Next PT session arranged with interpreter to be present at 2:30pm this afternoon.     Follow Up Recommendations Home health PT;Supervision/Assistance - 24 hour    Equipment Recommendations  Rolling walker with 5" wheels;3in1 (PT)    Recommendations for Other Services OT consult     Precautions / Restrictions Precautions Precautions: Knee;Fall Precaution Comments: educated on no pillow under Lt Knee  Required Braces or Orthoses: Knee Immobilizer - Left Restrictions Weight Bearing Restrictions: Yes LLE Weight Bearing: Weight bearing as tolerated      Mobility  Bed Mobility Overal bed mobility: Needs Assistance Bed Mobility: Supine to Sit     Supine to sit: Min assist;HOB elevated     General bed mobility comments: (A) to bring Lt LE to/off EOB; relying on handrails and incr time due to pain   Transfers Overall transfer level: Needs assistance Equipment used: Rolling walker (2 wheeled) Transfers: Sit to/from Stand Sit to Stand: Min assist;From elevated surface         General transfer comment: (A) to balance and steady with sit to stand; pt grimacing in pain with WB on Lt LE  Ambulation/Gait Ambulation/Gait assistance: Min guard Ambulation Distance (Feet): 12 Feet Assistive device: Rolling walker (2 wheeled) Gait Pattern/deviations: Step-to pattern;Decreased stance time - left;Decreased step  length - right;Antalgic Gait velocity: decr Gait velocity interpretation: Below normal speed for age/gender General Gait Details: multimodal cues for step through gt sequencing and upright posture; pt diaphoretic but denied any dizziness; returned to chair and LEs elevated   Stairs            Wheelchair Mobility    Modified Rankin (Stroke Patients Only)       Balance Overall balance assessment: Needs assistance Sitting-balance support: Feet supported;No upper extremity supported Sitting balance-Leahy Scale: Fair Sitting balance - Comments: guarded; denied any dizziness   Standing balance support: During functional activity;Bilateral upper extremity supported Standing balance-Leahy Scale: Poor Standing balance comment: RW to balance                              Pertinent Vitals/Pain Pain Assessment: 0-10 Pain Score: 7  Pain Location: lt knee Pain Descriptors / Indicators: Grimacing Pain Intervention(s): Monitored during session;Premedicated before session;Repositioned    Home Living Family/patient expects to be discharged to:: Private residence Living Arrangements: Spouse/significant other Available Help at Discharge: Family;Available 24 hours/day Type of Home: House Home Access:  (need to re-ask? some confusion )     Home Layout: One level Home Equipment: None Additional Comments: Pt has significant other who will be with him friday afternoon til l sunday; her son (45 yo) will be with him as needed while she is at work     Prior Function Level of Independence: Independent               Journalist, newspaper  Extremity/Trunk Assessment   Upper Extremity Assessment: Defer to OT evaluation           Lower Extremity Assessment: LLE deficits/detail   LLE Deficits / Details: quad 2+/5; 10 to 50 limited by pain   Cervical / Trunk Assessment: Normal  Communication   Communication: Deaf  Cognition Arousal/Alertness: Awake/alert Behavior  During Therapy: WFL for tasks assessed/performed Overall Cognitive Status: Within Functional Limits for tasks assessed                      General Comments General comments (skin integrity, edema, etc.): interpreter setup for 2:pm for next session; educated on importance of calling for nurse tech for OOB activity     Exercises Total Joint Exercises Ankle Circles/Pumps: AROM;Both;10 reps;Supine Quad Sets: AAROM;Left;10 reps;Seated Hip ABduction/ADduction: AAROM;Left;10 reps;Seated Long Arc Quad: AAROM;Left;10 reps;Seated      Assessment/Plan    PT Assessment Patient needs continued PT services  PT Diagnosis Difficulty walking;Generalized weakness;Acute pain   PT Problem List Decreased strength;Decreased range of motion;Decreased activity tolerance;Decreased balance;Decreased mobility;Decreased knowledge of use of DME;Decreased safety awareness;Decreased knowledge of precautions;Pain  PT Treatment Interventions DME instruction;Gait training;Stair training;Functional mobility training;Balance training;Therapeutic exercise;Therapeutic activities;Neuromuscular re-education;Patient/family education   PT Goals (Current goals can be found in the Care Plan section) Acute Rehab PT Goals Patient Stated Goal: to go home PT Goal Formulation: With patient Time For Goal Achievement: 03/25/14 Potential to Achieve Goals: Good    Frequency 7X/week   Barriers to discharge        Co-evaluation               End of Session Equipment Utilized During Treatment: Gait belt;Left knee immobilizer Activity Tolerance: Patient tolerated treatment well Patient left: in chair;with call bell/phone within reach;with nursing/sitter in room Nurse Communication: Mobility status;Precautions;Weight bearing status         Time: 0911-0950 PT Time Calculation (min) (ACUTE ONLY): 39 min   Charges:   PT Evaluation $Initial PT Evaluation Tier I: 1 Procedure PT Treatments $Gait Training: 8-22  mins $Therapeutic Exercise: 8-22 mins   PT G CodesGustavus Bryant, [place]  [phone] 03/18/2014, 11:46 AM

## 2014-03-18 NOTE — Progress Notes (Signed)
Pt needs sign language interpreter for communication MD. Social work please advise.

## 2014-03-18 NOTE — Op Note (Signed)
NAME:  Alan Mendoza, Alan Mendoza NO.:  [id number]  MEDICAL RECORD NO.:  [id number]  LOCATION:  [id number]                        FACILITY:  [facility]  PHYSICIAN:  Anderson Malta, M.D.    DATE OF BIRTH:  February 17, 1965  DATE OF PROCEDURE: DATE OF DISCHARGE:                              OPERATIVE REPORT   PREOPERATIVE DIAGNOSIS:  Left knee arthritis.  POSTOPERATIVE DIAGNOSIS:  Left knee arthritis.  PROCEDURE:  Left total knee replacement.  SURGEON:  Anderson Malta, M.D.  ASSISTANT:  Laure Kidney, RNFA.  INDICATIONS:  Alan Mendoza is a 49 year old patient with end-stage left knee arthritis, presents for operative management after explanation of risks and benefits.  Preoperative examination under anesthesia demonstrated range of motion from about 8 degrees of flexion contracture to about 90 to 95 degrees of flexion only.  COMPONENTS IMPLANTED:  Press-fit Triathlon posterior cruciate retaining Stryker size 5 femur, 5 tibia, 11 poly insert, 35 mm 3 pegged press-fit patella.  PROCEDURE IN DETAIL:  The patient was brought to the operating room where spinal anesthetic was induced.  Preop IV antibiotics were administered.  Time-out was called.  Left leg was prescrubbed with alcohol and Betadine, allowed to air dry, prepped with DuraPrep solution and draped in sterile manner.  Charlie Pitter was used to cover the operative field.  Leg elevated and exsanguinated with an Esmarch wrap.  Tourniquet inflated.  Total tourniquet time 99 minutes.  Anterior approach to the knee was made.  The median parapatellar approach was made.  Arthrotomy marked with #1 Vicryl suture.  Patella was everted.  Osteophytes were removed.  Tissue removed from the anterior distal femur.  Lateral patellofemoral ligament released.  ACL removed.  Significant medial compartment and patellofemoral compartment wear were present.  Soft tissue dissection performed in the proximal medial tibia. Intramedullary alignment then used to  cut the tibia 4 mm off the most affected tibial side perpendicular to the mechanical axis.  Collaterals and posterior neurovascular structures were protected.  Tibial plateau cut was checked for levelness.  At this time, intramedullary alignment then used to make a 10-mm resection off the distal femur.  A 4-in-1 block was made that was performed with good cuts made.  Trial reduction was then performed with a 5 base plate and a 5 femur in position.  This allowed full extension and full flexion and good stability varus valgus stress at 0, 30, and 90 degrees.  Patella then prepared, cut from 25 to 15 mm.  Trial patella was placed.  Again, he had excellent tracking with no thumbs technique was noted.  Trial components were removed.  Thorough irrigation performed.  Exparel injected into the capsule.  True components placed with an 11 mm spacer which allowed full extension, full flexion, and good stability varus valgus stress at 0, 30, and 90 degrees.  Tourniquet was released.  Bleeding points encountered and controlled with electrocautery.  Thorough irrigation again performed and the knee was then closed using interrupted inverted #1 Vicryl suture, 0 Vicryl suture, 2-0 Vicryl suture, and a 4-0 Monocryl. Solution of Marcaine, morphine, clonidine injected into the knee for postop pain relief.  Bulky dressing and knee immobilizer placed.  The patient tolerated the  procedure well without immediate complications. Transferred to the recovery in stable condition.     Anderson Malta, M.D.     GSD/MEDQ  D:  03/17/2014  T:  03/18/2014  Job:  [id number]

## 2014-03-18 NOTE — Progress Notes (Signed)
Utilization review completed.  

## 2014-03-18 NOTE — Progress Notes (Signed)
Physical Therapy Treatment Patient Details Name: Alan Mendoza MRN: [id number] DOB: [birth year] Today's Date: 03/18/2014    History of Present Illness Pt is a 49 y.o. male s/p Lt TKA. pt is deaf.     PT Comments    Pt slowly progressing with mobility. Cont to require min (A). Attempting to stand without KI, Lt LE continues to buckle. D/C disposition updated to SNF due to lack of 24/7 caregiver (A) at this time.   Follow Up Recommendations  SNF;Supervision/Assistance - 24 hour     Equipment Recommendations  Rolling walker with 5" wheels;3in1 (PT)    Recommendations for Other Services       Precautions / Restrictions Precautions Precautions: Knee;Fall Precaution Comments: reinforced no pillow under knee  Required Braces or Orthoses: Knee Immobilizer - Left Restrictions Weight Bearing Restrictions: Yes LLE Weight Bearing: Weight bearing as tolerated    Mobility  Bed Mobility Overal bed mobility: Needs Assistance Bed Mobility: Sit to Supine       Sit to supine: Min assist   General bed mobility comments: educated on hooklying technique to self (A) ; pt was able to perform partially then required (A) to bring leg fully into supine position   Transfers Overall transfer level: Needs assistance Equipment used: Rolling walker (2 wheeled) Transfers: Sit to/from Stand Sit to Stand: Min assist         General transfer comment: required 2 attempts; initially requesting to attempt without Ki; pt with Lt LE buckling and LOB posteriorly; min (A) to balance   Ambulation/Gait Ambulation/Gait assistance: Min assist Ambulation Distance (Feet): 60 Feet Assistive device: Rolling walker (2 wheeled) Gait Pattern/deviations: Step-to pattern;Decreased stance time - left;Decreased step length - right;Antalgic;Trunk flexed Gait velocity: decr Gait velocity interpretation: Below normal speed for age/gender General Gait Details: multimodal cues for upright gt; pt requiring single standing  rest break; no c/o dizziness    Stairs            Wheelchair Mobility    Modified Rankin (Stroke Patients Only)       Balance Overall balance assessment: Needs assistance Sitting-balance support: Feet supported;No upper extremity supported Sitting balance-Leahy Scale: Good   Postural control: Posterior lean Standing balance support: During functional activity;Bilateral upper extremity supported Standing balance-Leahy Scale: Poor Standing balance comment: had LOB posteriorly at chair                    Cognition Arousal/Alertness: Awake/alert Behavior During Therapy: WFL for tasks assessed/performed Overall Cognitive Status: Within Functional Limits for tasks assessed                      Exercises Total Joint Exercises Ankle Circles/Pumps: AROM;Both;10 reps;Supine Heel Slides: AAROM;5 reps;Left;Limitations Heel Slides Limitations: pain Hip ABduction/ADduction: AAROM;Left;10 reps;Seated Knee Flexion: AAROM;Left;5 reps;Supine    General Comments General comments (skin integrity, edema, etc.): pt refusing CPM      Pertinent Vitals/Pain Pain Assessment: 0-10 Pain Score: 6  Pain Location: Lt knee (quad region)  Pain Descriptors / Indicators: Discomfort;Grimacing Pain Intervention(s): Monitored during session;Premedicated before session;Repositioned    Home Living Family/patient expects to be discharged to:: Private residence Living Arrangements: Spouse/significant other (girlfriend) Available Help at Discharge: Family;Available PRN/intermittently (girlfriend works sunday-thursday all day and friday in the morning; no one else to assist as family lives in [place]) Type of Home: House Home Access: Ramped entrance   [place]: One [place]: None Additional Comments: Pt has significant other who will be with him friday afternoon  til l sunday; her son (36 yo) will be with him as needed while she is at work     Prior Function Level of  Independence: Independent          PT Goals (current goals can now be found in the care plan section) Acute Rehab PT Goals Patient Stated Goal: Go to rehab to get stronger PT Goal Formulation: With patient Time For Goal Achievement: 03/25/14 Potential to Achieve Goals: Good Progress towards PT goals: Progressing toward goals    Frequency  7X/week    PT Plan Discharge plan needs to be updated    Co-evaluation             End of Session Equipment Utilized During Treatment: Gait belt;Left knee immobilizer Activity Tolerance: Patient limited by pain Patient left: with call bell/phone within reach;in bed;with nursing/sitter in room     Time: 1455-1521 PT Time Calculation (min) (ACUTE ONLY): 26 min  Charges:  $Gait Training: 8-22 mins $Therapeutic Exercise: 8-22 mins                    G CodesGustavus Mendoza, [place]  [phone] 03/18/2014, 4:26 PM

## 2014-03-18 NOTE — Discharge Instructions (Signed)

## 2014-03-18 NOTE — Evaluation (Signed)
Occupational Therapy Evaluation Patient Details Name: Alan Mendoza MRN: [id number] DOB: [birth year] Today's Date: 03/18/2014    History of Present Illness Pt is a 49 y.o. male s/p Lt TKA. pt is deaf.    Clinical Impression   Patient independent PTA. Patient currently requires up to mod assist for LB ADLs and min assist for all mobility and transfers. Patient will benefit from acute OT to increase overall independence in the areas of ADLs, functional mobility, and overall safety in order to safely discharge to venue listed below. Patient with decreased caregiver assistance at home and will require 24/7 supervision/assistance, therefore recommending SNF for comprehensive rehab to help patient regain his independence, decrease burden of care, and for his overall safety.     Follow Up Recommendations  SNF;Supervision/Assistance - 24 hour    Equipment Recommendations   (TBD next venue of care)    Recommendations for Other Services  None at this time   Precautions / Restrictions Precautions Precautions: Knee;Fall Precaution Comments: educated on no pillow under Lt Knee  Required Braces or Orthoses: Knee Immobilizer - Left Restrictions Weight Bearing Restrictions: Yes LLE Weight Bearing: Weight bearing as tolerated      Mobility Bed Mobility - Per PT eval Overal bed mobility: Needs Assistance Bed Mobility: Supine to Sit     Supine to sit: Min assist;HOB elevated  Transfers Overall transfer level: Needs assistance Equipment used: Rolling walker (2 wheeled) Transfers: Sit to/from Stand Sit to Stand: Min assist         General transfer comment: Patient required assistance to balance. KI needed to maintain left knee control secondary to patient with poor strengh and increased pain.     Balance Overall balance assessment: Needs assistance Sitting-balance support: Feet supported;No upper extremity supported Sitting balance-Leahy Scale: Fair Sitting balance - Comments: guarded;  denied any dizziness   Standing balance support: Bilateral upper extremity supported Standing balance-Leahy Scale: Poor Standing balance comment: RW to balance     ADL Overall ADL's : Needs assistance/impaired Eating/Feeding: Set up;Sitting   Grooming: Set up;Sitting   Upper Body Bathing: Set up;Sitting   Lower Body Bathing: Moderate assistance;Sit to/from stand;Cueing for safety   Upper Body Dressing : Set up;Sitting   Lower Body Dressing: Sit to/from stand;Cueing for safety;Moderate assistance   Toilet Transfer: Minimal assistance;RW;Ambulation;BSC    Functional mobility during ADLs: Minimal assistance;Rolling walker;Cueing for safety General ADL Comments: Patient with increased pain and required KI for all mobility at this time. Patient able to reach RLE for LB ADLs, but unable to reach LLE. Educated patient on use of AE (reacher, sock aid, LH sponge, LH shoe horn) to increase independence with LB ADLs.     Pertinent Vitals/Pain Pain Assessment: 0-10 Pain Score: 6  Pain Location: left knee (quad area) Pain Descriptors / Indicators: Grimacing;Aching Pain Intervention(s): Monitored during session   Extremity/Trunk Assessment Upper Extremity Assessment Upper Extremity Assessment: Overall WFL for tasks assessed   Lower Extremity Assessment Lower Extremity Assessment: Defer to PT evaluation LLE Deficits / Details: quad 2+/5; 10 to 50 limited by pain  LLE: Unable to fully assess due to pain LLE Coordination: decreased gross motor   Cervical / Trunk Assessment Cervical / Trunk Assessment: Normal   Communication Communication Communication: Deaf   Cognition Arousal/Alertness: Awake/alert Behavior During Therapy: WFL for tasks assessed/performed Overall Cognitive Status: Within Functional Limits for tasks assessed             Home Living Family/patient expects to be discharged to:: Private residence Living Arrangements: Spouse/significant  other  (girlfriend) Available Help at Discharge: Family;Available PRN/intermittently (girlfriend works sunday-thursday all day and friday in the morning; no one else to assist as family lives in [place]) Type of Home: House Home Access: Ramped entrance     [place]: One level     Bathroom Shower/Tub: Tub/shower unit;Curtain   Biochemist, clinical: [place]: None   Additional Comments: Pt has significant other who will be with him friday afternoon til l sunday; her son (54 yo) will be with him as needed while she is at work       Prior Functioning/Environment Level of Independence: Independent     OT Diagnosis: Generalized weakness;Acute pain   OT Problem List: Decreased strength;Decreased range of motion;Decreased activity tolerance;Impaired balance (sitting and/or standing);Decreased knowledge of use of DME or AE;Decreased knowledge of precautions;Pain   OT Treatment/Interventions: Self-care/ADL training;Therapeutic exercise;Energy conservation;DME and/or AE instruction;Therapeutic activities;Patient/family education;Balance training    OT Goals(Current goals can be found in the care plan section) Acute Rehab OT Goals Patient Stated Goal: Go to rehab to get stronger OT Goal Formulation: With patient Time For Goal Achievement: 03/25/14 Potential to Achieve Goals: Good ADL Goals Pt Will Perform Grooming: with supervision;standing Pt Will Perform Lower Body Bathing: with min assist;with adaptive equipment;sit to/from stand Pt Will Perform Lower Body Dressing: with min assist;with adaptive equipment;sit to/from stand Pt Will Transfer to Toilet: ambulating;with supervision;bedside commode Pt Will Perform Tub/Shower Transfer: Tub transfer;rolling walker;ambulating;tub bench;3 in 1 Additional ADL Goal #1: Patient will independently adhere and verbalize knee precautions 100% of the time  OT Frequency: Min 2X/week   Barriers to D/C: Decreased caregiver support           End of Session Equipment Utilized During Treatment: Gait belt;Rolling walker;Left knee immobilizer CPM Left Knee CPM Left Knee: Off  Activity Tolerance: Patient limited by pain Patient left: Other (comment) (With PT)   Time: 8242-3536 OT Time Calculation (min): 28 min Charges:  OT General Charges $OT Visit: 1 Procedure OT Evaluation $Initial OT Evaluation Tier I: 1 Procedure OT Treatments $Self Care/Home Management : 8-22 mins  Tenna Lacko , MS, OTR/L, CLT Pager: [phone]  03/18/2014, 3:13 PM

## 2014-03-18 NOTE — Anesthesia Postprocedure Evaluation (Signed)
  Anesthesia Post-op Note  Patient: Alan Mendoza  Procedure(s) Performed: Procedure(s): TOTAL KNEE ARTHROPLASTY (Left)  Patient Location: PACU  Anesthesia Type:General  Level of Consciousness: awake  Airway and Oxygen Therapy: Patient Spontanous Breathing  Post-op Pain: mild  Post-op Assessment: Post-op Vital signs reviewed  Post-op Vital Signs: Reviewed  Last Vitals:  Filed Vitals:   03/18/14 0441  BP: 95/55  Pulse: 73  Temp: 37.7 C  Resp: 15    Complications: No apparent anesthesia complications

## 2014-03-19 LAB — CBC
HCT: 34.2 % — ABNORMAL LOW (ref 39.0–52.0)
Hemoglobin: 11.1 g/dL — ABNORMAL LOW (ref 13.0–17.0)
MCH: 27.7 pg (ref 26.0–34.0)
MCHC: 32.5 g/dL (ref 30.0–36.0)
MCV: 85.3 fL (ref 78.0–100.0)
Platelets: 185 10*3/uL (ref 150–400)
RBC: 4.01 MIL/uL — ABNORMAL LOW (ref 4.22–5.81)
RDW: 13.1 % (ref 11.5–15.5)
WBC: 9.5 10*3/uL (ref 4.0–10.5)

## 2014-03-19 LAB — GLUCOSE, CAPILLARY
GLUCOSE-CAPILLARY: 126 mg/dL — AB (ref 70–99)
Glucose-Capillary: 133 mg/dL — ABNORMAL HIGH (ref 70–99)

## 2014-03-19 MED ORDER — RIVAROXABAN 10 MG PO TABS: 10.0000 mg | ORAL_TABLET | Freq: Every day | ORAL | Status: DC

## 2014-03-19 MED ORDER — METHOCARBAMOL 500 MG PO TABS: 500.0000 mg | ORAL_TABLET | Freq: Four times a day (QID) | ORAL | Status: DC | PRN

## 2014-03-19 MED ORDER — OXYCODONE HCL 5 MG PO TABS: 5.0000 mg | ORAL_TABLET | ORAL | Status: DC | PRN

## 2014-03-19 NOTE — Progress Notes (Signed)
Physical Therapy Treatment Patient Details Name: Alan Mendoza MRN: [id number] DOB: [birth year] Today's Date: 03/19/2014    History of Present Illness Pt is a 49 y.o. male s/p Lt TKA. pt is deaf.     PT Comments    Interpreter utilized and setup for afternoon session. Pt with difficulty WB and continues to have gt abnormalities and difficulty flexing knee. Will cont to follow per POC.   Follow Up Recommendations  SNF;Supervision/Assistance - 24 hour     Equipment Recommendations  Rolling walker with 5" wheels;3in1 (PT)    Recommendations for Other Services       Precautions / Restrictions Precautions Precautions: Knee;Fall Precaution Comments: reinforced no pillow under knee  Required Braces or Orthoses: Knee Immobilizer - Left Restrictions Weight Bearing Restrictions: Yes LLE Weight Bearing: Weight bearing as tolerated    Mobility  Bed Mobility Overal bed mobility: Needs Assistance Bed Mobility: Supine to Sit     Supine to sit: Min guard;HOB elevated     General bed mobility comments: min guard to steady Lt LE off EOB and to ground  Transfers Overall transfer level: Needs assistance   Transfers: Sit to/from Stand Sit to Stand: Min guard         General transfer comment: cues for hand placement and safety  Ambulation/Gait Ambulation/Gait assistance: Min guard Ambulation Distance (Feet): 80 Feet Assistive device: Rolling walker (2 wheeled) Gait Pattern/deviations: Step-to pattern;Decreased stance time - left;Decreased step length - right;Antalgic;Trunk flexed Gait velocity: decr Gait velocity interpretation: Below normal speed for age/gender General Gait Details: cues for step through gt and proper heel strike on Lt LE; pt not able to obtain full foot flat due to pain with WB   Stairs            Wheelchair Mobility    Modified Rankin (Stroke Patients Only)       Balance Overall balance assessment: Needs assistance Sitting-balance support:  Feet supported;No upper extremity supported Sitting balance-Leahy Scale: Good     Standing balance support: During functional activity;Bilateral upper extremity supported Standing balance-Leahy Scale: Poor Standing balance comment: RW to balance                    Cognition Arousal/Alertness: Awake/alert Behavior During Therapy: WFL for tasks assessed/performed Overall Cognitive Status: Within Functional Limits for tasks assessed                      Exercises Total Joint Exercises Ankle Circles/Pumps: AROM;Both;10 reps;Supine Hip ABduction/ADduction: AAROM;Left;10 reps;Seated Long Arc Quad: AAROM;Left;10 reps;Seated Knee Flexion: AAROM;Left;10 reps;Seated Goniometric ROM: 5 to 50 limited by pain    General Comments        Pertinent Vitals/Pain Pain Assessment: 0-10 Pain Score: 4  Pain Location: Lt knee with WB Pain Descriptors / Indicators: Constant;Discomfort Pain Intervention(s): Monitored during session;Premedicated before session;Repositioned    Home Living                      Prior Function            PT Goals (current goals can now be found in the care plan section) Acute Rehab PT Goals Patient Stated Goal: Go to rehab to get stronger PT Goal Formulation: With patient Time For Goal Achievement: 03/25/14 Potential to Achieve Goals: Good Progress towards PT goals: Progressing toward goals    Frequency  7X/week    PT Plan Current plan remains appropriate    Co-evaluation  End of Session Equipment Utilized During Treatment: Gait belt;Left knee immobilizer Activity Tolerance: Patient tolerated treatment well Patient left: in chair;with call bell/phone within reach     Time: 0753-0821 PT Time Calculation (min) (ACUTE ONLY): 28 min  Charges:  $Gait Training: 8-22 mins $Therapeutic Exercise: 8-22 mins                    G CodesGustavus Mendoza, [place]  [phone] 03/19/2014, 8:59 AM

## 2014-03-19 NOTE — Clinical Social Work Psychosocial (Signed)
Clinical Social Work Department BRIEF PSYCHOSOCIAL ASSESSMENT 03/19/2014  Patient:  Alan Mendoza, Alan Mendoza     Account Number:  [id number]     Admit date:  03/17/2014  Clinical Social Worker:  Delrae Sawyers  Date/Time:  03/19/2014 03:58 PM  Referred by:  Physician  Date Referred:  03/19/2014 Referred for  SNF Placement   Other Referral:   none.   Interview type:  Patient Other interview type:   Patient's interpreter at bedside.    PSYCHOSOCIAL DATA Living Status:  SIGNIFICANT OTHER Admitted from facility:   Level of care:   Primary support name:  Harlene Ramus Primary support relationship to patient:  NONE Degree of support available:   Strong support system.    CURRENT CONCERNS Current Concerns  Post-Acute Placement   Other Concerns:   none.    SOCIAL WORK ASSESSMENT / PLAN CSW received referral for possible SNF placement at time of discharge. CSW met with patient (with assistance from the interpreter) at bedside. Patient informed CSW patient is aware of PT recommendation for SNF placement at time of discharge. Patient stated patient would prefer to complete short-term rehabilitation at [facility]. Patient expressed no further concerns or questions regarding SNF placement.    CSW to continue to follow and assist with discharge planning needs.   Assessment/plan status:  Psychosocial Support/Ongoing Assessment of Needs Other assessment/ plan:   none.   Information/referral to community resources:   Patient to discharge to [facility] once medically stable.    PATIENT'S/FAMILY'S RESPONSE TO PLAN OF CARE: Patient understanding and agreeable to CSW plan of care. Patient expressed  no further questions or concerns at this time.       Lubertha Sayres, [place] ([phone]) Licensed Clinical Social Worker Orthopedics [phone]) and Surgical [phone])

## 2014-03-19 NOTE — Progress Notes (Signed)
Subjective: Pt stable - walking in hall   Objective: Vital signs in last 24 hours: Temp:  [98.6 F (37 C)-100 F (37.8 C)] 100 F (37.8 C) (03/17 0519) Pulse Rate:  [74-101] 97 (03/17 0519) Resp:  [16-18] 17 (03/17 0519) BP: (95-127)/(42-75) 107/75 mmHg (03/17 0519) SpO2:  [90 %-96 %] 90 % (03/17 0519)  Intake/Output from previous day: 03/16 0701 - 03/17 0700 In: 960 [P.O.:960] Out: 1550 [Urine:1550] Intake/Output this shift: Total I/O In: -  Out: 300 [Urine:300]  Exam:  Neurovascular intact Sensation intact distally Intact pulses distally  Labs:  Recent Labs  03/18/14 0810  HGB 12.0*    Recent Labs  03/18/14 0810  WBC 6.2  RBC 4.27  HCT 36.9*  PLT 177    Recent Labs  03/18/14 0810  NA 135  K 4.1  CL 106  CO2 24  BUN 10  CREATININE 0.78  GLUCOSE 145*  CALCIUM 8.6   No results for input(s): LABPT, INR in the last 72 hours.  Assessment/Plan: Plan dc to snf am - rehab goals discussed today with aid of interpreter   DEAN,GREGORY SCOTT 03/19/2014, 8:09 AM

## 2014-03-19 NOTE — Discharge Summary (Signed)
Physician Discharge Summary  Patient ID: Alan Mendoza MRN: [id number] DOB/AGE: July 05, 1965 49 y.o.  Admit date: 03/17/2014 Discharge date: 03/20/2014  Admission Diagnoses:  Active Problems:   Degenerative arthritis of knee   Discharge Diagnoses:  Same  Surgeries: Procedure(s): TOTAL KNEE ARTHROPLASTY on 03/17/2014   Consultants:    Discharged Condition: Stable  Hospital Course: Alan Mendoza is an 49 y.o. male who was admitted 03/17/2014 with a chief complaint of knee pain, and found to have a diagnosis of degenerative knee arthritis.  They were brought to the operating room on 03/17/2014 and underwent the above named procedures. Tolerated rehab well and was walking in hall on POD 2.   Antibiotics given:  Anti-infectives    Start     Dose/Rate Route Frequency Ordered Stop   03/17/14 2200  ceFAZolin (ANCEF) IVPB 1 g/50 mL premix     1 g 100 mL/hr over 30 Minutes Intravenous 3 times per day 03/17/14 1947 03/18/14 0630   03/17/14 0600  ceFAZolin (ANCEF) IVPB 2 g/50 mL premix     2 g 100 mL/hr over 30 Minutes Intravenous On call to O.R. 03/16/14 1246 03/17/14 1430    .  Recent vital signs:  Filed Vitals:   03/19/14 0519  BP: 107/75  Pulse: 97  Temp: 100 F (37.8 C)  Resp: 17    Recent laboratory studies:  Results for orders placed or performed during the hospital encounter of 03/17/14  CBC  Result Value Ref Range   WBC 6.2 4.0 - 10.5 K/uL   RBC 4.27 4.22 - 5.81 MIL/uL   Hemoglobin 12.0 (L) 13.0 - 17.0 g/dL   HCT 36.9 (L) 39.0 - 52.0 %   MCV 86.4 78.0 - 100.0 fL   MCH 28.1 26.0 - 34.0 pg   MCHC 32.5 30.0 - 36.0 g/dL   RDW 13.1 11.5 - 15.5 %   Platelets 177 150 - 400 K/uL  Basic metabolic panel  Result Value Ref Range   Sodium 135 135 - 145 mmol/L   Potassium 4.1 3.5 - 5.1 mmol/L   Chloride 106 96 - 112 mmol/L   CO2 24 19 - 32 mmol/L   Glucose, Bld 145 (H) 70 - 99 mg/dL   BUN 10 6 - 23 mg/dL   Creatinine, Ser 0.78 0.50 - 1.35 mg/dL   Calcium 8.6 8.4 - 10.5  mg/dL   GFR calc non Af Amer >90 >90 mL/min   GFR calc Af Amer >90 >90 mL/min   Anion gap 5 5 - 15  Glucose, capillary  Result Value Ref Range   Glucose-Capillary 115 (H) 70 - 99 mg/dL  Glucose, capillary  Result Value Ref Range   Glucose-Capillary 177 (H) 70 - 99 mg/dL  Glucose, capillary  Result Value Ref Range   Glucose-Capillary 151 (H) 70 - 99 mg/dL  Glucose, capillary  Result Value Ref Range   Glucose-Capillary 131 (H) 70 - 99 mg/dL   Comment 1 Notify RN   Glucose, capillary  Result Value Ref Range   Glucose-Capillary 115 (H) 70 - 99 mg/dL  Glucose, capillary  Result Value Ref Range   Glucose-Capillary 126 (H) 70 - 99 mg/dL    Discharge Medications:     Medication List    TAKE these medications        aspirin EC 81 MG tablet  Take 81 mg by mouth daily as needed for mild pain.     GABAPENTIN PO  Take 1 capsule by mouth 2 (two) times daily.  methocarbamol 500 MG tablet  Commonly known as:  ROBAXIN  Take 1 tablet (500 mg total) by mouth every 6 (six) hours as needed for muscle spasms.     oxyCODONE 5 MG immediate release tablet  Commonly known as:  Oxy IR/ROXICODONE  Take 1-2 tablets (5-10 mg total) by mouth every 3 (three) hours as needed for breakthrough pain.     rivaroxaban 10 MG Tabs tablet  Commonly known as:  XARELTO  Take 1 tablet (10 mg total) by mouth daily with breakfast.        Diagnostic Studies: Dg Chest 2 View  03/05/2014   CLINICAL DATA:  Coronary artery disease.  EXAM: CHEST  2 VIEW  COMPARISON:  None.  FINDINGS: Mediastinum and hilar structures are normal. Right perihilar infiltrate with slight nodularity noted. Followup chest x-ray is recommended to demonstrate clearing. No pleural effusion or pneumothorax. Mild cardiomegaly. Coronary calcifications and/or stents. No pulmonary venous congestion. No acute bony abnormality.  IMPRESSION: 1. Mild right perihilar infiltrate with slight nodularity, findings most consistent pneumonia. Follow-up  chest x-ray demonstrates a suggested. 2. Cardiomegaly, no pulmonary venous congestion. Coronary artery disease appear   Electronically Signed   By: [place]   On: 03/05/2014 10:06   Ct Chest Wo Contrast  03/12/2014   CLINICAL DATA:  Aspiration pneumonia. Abnormal chest x-ray. Cough. Smoker.  EXAM: CT CHEST WITHOUT CONTRAST  TECHNIQUE: Multidetector CT imaging of the chest was performed following the standard protocol without IV contrast.  COMPARISON:  Chest radiographs 03/05/2014  FINDINGS: No enlarged axillary, mediastinal, or hilar lymph nodes are identified. Prior coronary artery stenting is noted. The heart is normal in size. No significant atherosclerotic calcification is identified. There is no pleural or pericardial effusion.  Major airways appear patent. There is mild to moderate centrilobular emphysema. There is mild bronchial wall thickening. There is a 4 mm nodule in the right lower lobe (series 4, image 38). No airspace consolidation is seen.  The visualized portion of the upper abdomen is unremarkable. Mild thoracic spondylosis is noted.  IMPRESSION: 1. Emphysema. 2. 4 mm right lower lobe nodule. Given risk factors for bronchogenic carcinoma, follow-up chest CT at 1 year is recommended. This recommendation follows the consensus statement: Guidelines for Management of Small Pulmonary Nodules Detected on CT Scans: A Statement from the [place] as published in Radiology 2005; 237:395-400.   Electronically Signed   By: Logan Bores   On: 03/12/2014 08:45    Disposition: Final discharge disposition not confirmed      Discharge Instructions    Call MD / Call 911    Complete by:  As directed   If you experience chest pain or shortness of breath, CALL 911 and be transported to the hospital emergency room.  If you develope a fever above 101 F, pus (white drainage) or increased drainage or redness at the wound, or calf pain, call your surgeon's office.     Constipation Prevention     Complete by:  As directed   Drink plenty of fluids.  Prune juice may be helpful.  You may use a stool softener, such as Colace (over the counter) 100 mg twice a day.  Use MiraLax (over the counter) for constipation as needed.     Diet - low sodium heart healthy    Complete by:  As directed      Discharge instructions    Complete by:  As directed   CPM 4 hours per day Keep incision dry Use heel foam  to keep leg straight Remove dressing next friday     Increase activity slowly as tolerated    Complete by:  As directed               Signed: Giordana Weinheimer SCOTT 03/19/2014, 8:13 AM

## 2014-03-19 NOTE — Progress Notes (Signed)
03/19/14 Set up with Arville Go [facility] for HHPT by MD office. PT recommended SNF. Referral made to CSW, patient to discharge [facility] SNF on 03/20/14. Will follow until d/c.

## 2014-03-19 NOTE — Progress Notes (Signed)
Physical Therapy Treatment Patient Details Name: Alan Mendoza MRN: [id number] DOB: [birth year] Today's Date: 03/19/2014    History of Present Illness Pt is a 49 y.o. male s/p Lt TKA. pt is deaf.     PT Comments    Pt slowly progressing his mobility. Cont to be greatly limited in ROM due to pain. In sitting AROM 8 to 45 today. Pt prefers doing exercises independently. Reviewed HEP handout with pt. Planned for interpreter at 8:30am tomorrow prior to D/C .   Follow Up Recommendations  SNF;Supervision/Assistance - 24 hour     Equipment Recommendations  Rolling walker with 5" wheels;3in1 (PT)    Recommendations for Other Services       Precautions / Restrictions Precautions Precautions: Knee;Fall Precaution Comments: reinforced no pillow under knee  Required Braces or Orthoses: Knee Immobilizer - Left Restrictions Weight Bearing Restrictions: Yes LLE Weight Bearing: Weight bearing as tolerated    Mobility  Bed Mobility                  Transfers Overall transfer level: Needs assistance Equipment used: Rolling walker (2 wheeled) Transfers: Sit to/from Stand Sit to Stand: Min guard         General transfer comment: min cues for hand placement ; incr time to transfer hands due to pain  Ambulation/Gait Ambulation/Gait assistance: Min guard Ambulation Distance (Feet): 100 Feet Assistive device: Rolling walker (2 wheeled) Gait Pattern/deviations: Step-to pattern;Decreased stance time - left;Decreased step length - right;Antalgic;Narrow base of support Gait velocity: decr Gait velocity interpretation: Below normal speed for age/gender General Gait Details: cues for proper Lt heel strike and upright posture; pt with very antalgic gt today; denied any dizziness; slowly progressing with multiple standing rest breaks   Stairs            Wheelchair Mobility    Modified Rankin (Stroke Patients Only)       Balance Overall balance assessment: Needs  assistance Sitting-balance support: Feet supported;No upper extremity supported Sitting balance-Leahy Scale: Good     Standing balance support: During functional activity;Single extremity supported;No upper extremity supported Standing balance-Leahy Scale: Fair Standing balance comment: RW or UE support at all times to balance ; pt (A) to bathroom; pt able to reach down out of BOS while standing; no LOB noted                    Cognition Arousal/Alertness: Awake/alert Behavior During Therapy: WFL for tasks assessed/performed Overall Cognitive Status: Within Functional Limits for tasks assessed                      Exercises Total Joint Exercises Ankle Circles/Pumps: AROM;Both;Seated;15 reps Quad Sets: AAROM;Left;10 reps;Seated    General Comments General comments (skin integrity, edema, etc.): reviewed HEP with pt;  answered questions reagarding HEP      Pertinent Vitals/Pain Pain Assessment: 0-10 Pain Score: 3  Pain Location: Lt knee and c/o Rt knee "cramping" Pain Descriptors / Indicators: Cramping;Spasm Pain Intervention(s): Monitored during session;Repositioned;RN gave pain meds during session    Home Living                      Prior Function            PT Goals (current goals can now be found in the care plan section) Acute Rehab PT Goals Patient Stated Goal: to get better and independent PT Goal Formulation: With patient Time For Goal Achievement: 03/25/14 Potential to Achieve Goals:  Good Progress towards PT goals: Progressing toward goals    Frequency  7X/week    PT Plan Current plan remains appropriate    Co-evaluation             End of Session Equipment Utilized During Treatment: Gait belt;Left knee immobilizer Activity Tolerance: Patient tolerated treatment well Patient left: in bed;with call bell/phone within reach     Time: 1330-1400 PT Time Calculation (min) (ACUTE ONLY): 30 min  Charges:  $Gait Training:  23-37 mins                    G CodesGustavus Mendoza, [place]  [phone] 03/19/2014, 3:38 PM

## 2014-03-19 NOTE — Clinical Social Work Placement (Signed)
Clinical Social Work Department CLINICAL SOCIAL WORK PLACEMENT NOTE 03/19/2014  Patient:  ZAIRE, LEVESQUE  Account Number:  [id number] Admit date:  03/17/2014  Clinical Social Worker:  Wylene Men  Date/time:  03/19/2014 11:47 AM  Clinical Social Work is seeking post-discharge placement for this patient at the following level of care:   [place]   (*CSW will update this form in Epic as items are completed)   03/19/2014  Patient/family provided with [place] Department of Clinical Social Work's list of facilities offering this level of care within the geographic area requested by the patient (or if unable, by the patient's family).  03/19/2014  Patient/family informed of their freedom to choose among providers that offer the needed level of care, that participate in Medicare, Medicaid or managed care program needed by the patient, have an available bed and are willing to accept the patient.  03/19/2014  Patient/family informed of [facility]' ownership interest in [facility], as well as of the fact that they are under no obligation to receive care at this facility.  PASARR submitted to EDS on 03/19/2014 PASARR number received on 03/19/2014  FL2 transmitted to all facilities in geographic area requested by pt/family on  03/19/2014 FL2 transmitted to all facilities within larger geographic area on   Patient informed that his/her managed care company has contracts with or will negotiate with  certain facilities, including the following:     Patient/family informed of bed offers received:   Patient chooses bed at [place] Physician recommends and patient chooses bed at    Patient to be transferred to [facility] on  03/20/2014 Patient to be transferred to facility by  Patient and family notified of transfer on  Name of family member notified:    The following physician request were entered in Epic:   Additional Comments:  Nonnie Done, [place] [phone]  Psychiatric & Orthopedics (5N 1-16) Clinical Social Worker

## 2014-03-20 DIAGNOSIS — S83105A Unspecified dislocation of left knee, initial encounter: Secondary | ICD-10-CM | POA: Diagnosis not present

## 2014-03-20 DIAGNOSIS — R278 Other lack of coordination: Secondary | ICD-10-CM | POA: Diagnosis not present

## 2014-03-20 DIAGNOSIS — R2681 Unsteadiness on feet: Secondary | ICD-10-CM | POA: Diagnosis not present

## 2014-03-20 DIAGNOSIS — G629 Polyneuropathy, unspecified: Secondary | ICD-10-CM | POA: Diagnosis not present

## 2014-03-20 DIAGNOSIS — D62 Acute posthemorrhagic anemia: Secondary | ICD-10-CM | POA: Diagnosis not present

## 2014-03-20 DIAGNOSIS — M25562 Pain in left knee: Secondary | ICD-10-CM | POA: Diagnosis not present

## 2014-03-20 DIAGNOSIS — K5900 Constipation, unspecified: Secondary | ICD-10-CM | POA: Diagnosis not present

## 2014-03-20 DIAGNOSIS — M792 Neuralgia and neuritis, unspecified: Secondary | ICD-10-CM | POA: Diagnosis not present

## 2014-03-20 DIAGNOSIS — G4700 Insomnia, unspecified: Secondary | ICD-10-CM | POA: Diagnosis not present

## 2014-03-20 DIAGNOSIS — M25559 Pain in unspecified hip: Secondary | ICD-10-CM | POA: Diagnosis not present

## 2014-03-20 DIAGNOSIS — M25552 Pain in left hip: Secondary | ICD-10-CM | POA: Diagnosis not present

## 2014-03-20 DIAGNOSIS — H9191 Unspecified hearing loss, right ear: Secondary | ICD-10-CM | POA: Diagnosis not present

## 2014-03-20 DIAGNOSIS — I2510 Atherosclerotic heart disease of native coronary artery without angina pectoris: Secondary | ICD-10-CM | POA: Diagnosis not present

## 2014-03-20 DIAGNOSIS — E119 Type 2 diabetes mellitus without complications: Secondary | ICD-10-CM | POA: Diagnosis not present

## 2014-03-20 DIAGNOSIS — Z96652 Presence of left artificial knee joint: Secondary | ICD-10-CM | POA: Diagnosis not present

## 2014-03-20 DIAGNOSIS — M1712 Unilateral primary osteoarthritis, left knee: Secondary | ICD-10-CM | POA: Diagnosis not present

## 2014-03-20 DIAGNOSIS — Z471 Aftercare following joint replacement surgery: Secondary | ICD-10-CM | POA: Diagnosis not present

## 2014-03-20 LAB — CBC
HCT: 31.8 % — ABNORMAL LOW (ref 39.0–52.0)
Hemoglobin: 10.4 g/dL — ABNORMAL LOW (ref 13.0–17.0)
MCH: 27.9 pg (ref 26.0–34.0)
MCHC: 32.7 g/dL (ref 30.0–36.0)
MCV: 85.3 fL (ref 78.0–100.0)
Platelets: 177 10*3/uL (ref 150–400)
RBC: 3.73 MIL/uL — ABNORMAL LOW (ref 4.22–5.81)
RDW: 13 % (ref 11.5–15.5)
WBC: 9.1 10*3/uL (ref 4.0–10.5)

## 2014-03-20 LAB — GLUCOSE, CAPILLARY
GLUCOSE-CAPILLARY: 126 mg/dL — AB (ref 70–99)
Glucose-Capillary: 132 mg/dL — ABNORMAL HIGH (ref 70–99)

## 2014-03-20 MED ORDER — MAGNESIUM CITRATE PO SOLN
1.0000 | Freq: Once | ORAL | Status: AC
  Administered 2014-03-20: 1 via ORAL
  Filled 2014-03-20: qty 296

## 2014-03-20 NOTE — Progress Notes (Signed)
Discharge instructions gave to pt via interpreter. All questions answered and pt will discharge this afternoon.

## 2014-03-20 NOTE — Progress Notes (Signed)
Pt stable - ready for dc to snf this afternoon after bm - mag citrate given

## 2014-03-20 NOTE — Progress Notes (Signed)
Physical Therapy Treatment Patient Details Name: Alan Mendoza MRN: [id number] DOB: 1965/09/30 Today's Date: 03/20/2014    History of Present Illness Pt is a 49 y.o. male s/p Lt TKA. pt is deaf.     PT Comments    Interpreter utilized. Pt slowly progressing. Continues to have incr difficulty flexing Lt LE due to pain. Pt c/o stomach pain this session and denies any BM since admission, RN made aware.   Follow Up Recommendations  SNF;Supervision/Assistance - 24 hour     Equipment Recommendations  Rolling walker with 5" wheels;3in1 (PT)    Recommendations for Other Services       Precautions / Restrictions Precautions Precautions: Knee;Fall Precaution Comments: reinforced no pillow under knee  Required Braces or Orthoses: Knee Immobilizer - Left Restrictions Weight Bearing Restrictions: Yes LLE Weight Bearing: Weight bearing as tolerated    Mobility  Bed Mobility Overal bed mobility: Needs Assistance Bed Mobility: Supine to Sit     Supine to sit: Min guard;HOB elevated     General bed mobility comments: min guard to guide Lt LE to floor  Transfers Overall transfer level: Needs assistance Equipment used: Rolling walker (2 wheeled) Transfers: Sit to/from Stand Sit to Stand: Min guard         General transfer comment: min guard to steady; cues for hand placement and sequencing   Ambulation/Gait Ambulation/Gait assistance: Min guard Ambulation Distance (Feet): 110 Feet Assistive device: Rolling walker (2 wheeled) Gait Pattern/deviations: Step-to pattern;Decreased stance time - left;Decreased step length - right;Trunk flexed Gait velocity: decr Gait velocity interpretation: Below normal speed for age/gender General Gait Details: multimodal cues for step through gt sequencing and heel strike; pt looking at ground and grimacing throughout ambulation with WB on Lt LE    Stairs            Wheelchair Mobility    Modified Rankin (Stroke Patients Only)        Balance Overall balance assessment: Needs assistance Sitting-balance support: Feet supported;No upper extremity supported Sitting balance-Leahy Scale: Good     Standing balance support: During functional activity;Bilateral upper extremity supported Standing balance-Leahy Scale: Poor Standing balance comment: RW to balance at all times this session                    Cognition Arousal/Alertness: Awake/alert Behavior During Therapy: WFL for tasks assessed/performed Overall Cognitive Status: Within Functional Limits for tasks assessed                      Exercises Total Joint Exercises Ankle Circles/Pumps: AROM;Both;Seated;15 reps Quad Sets: AAROM;Left;10 reps;Seated Hip ABduction/ADduction: AAROM;Left;10 reps;Seated Knee Flexion: AAROM;Left;10 reps;Seated;Limitations Knee Flexion Limitations: pain Goniometric ROM: 5 to 45 this session; greatly limited by pain    General Comments General comments (skin integrity, edema, etc.): pt refusing footsie roll      Pertinent Vitals/Pain Pain Assessment: 0-10 Pain Score: 4  Pain Location: Lt knee  and c/o stomach pain Pain Descriptors / Indicators: Aching;Sore Pain Intervention(s): Monitored during session;Premedicated before session;Repositioned    Home Living                      Prior Function            PT Goals (current goals can now be found in the care plan section) Acute Rehab PT Goals Patient Stated Goal: to not have stomach pain PT Goal Formulation: With patient Time For Goal Achievement: 03/25/14 Potential to Achieve Goals: Good Progress towards  PT goals: Progressing toward goals    Frequency  7X/week    PT Plan Current plan remains appropriate    Co-evaluation             End of Session Equipment Utilized During Treatment: Gait belt;Left knee immobilizer Activity Tolerance: Patient tolerated treatment well Patient left: in chair;with call bell/phone within reach;Other  (comment) (interpreter in room)     Time: 0822-0856 PT Time Calculation (min) (ACUTE ONLY): 34 min  Charges:  $Gait Training: 8-22 mins $Therapeutic Exercise: 8-22 mins                    G Codes:      Alan Mendoza, [place]  [phone] 03/20/2014, 10:09 AM

## 2014-03-20 NOTE — Clinical Social Work Placement (Signed)
Clinical Social Work Department CLINICAL SOCIAL WORK PLACEMENT NOTE 03/20/2014  Patient:  Alan Mendoza, Alan Mendoza  Account Number:  [id number] Admit date:  03/17/2014  Clinical Social Worker:  Wylene Men  Date/time:  03/19/2014 11:47 AM  Clinical Social Work is seeking post-discharge placement for this patient at the following level of care:   [place]   (*CSW will update this form in Epic as items are completed)   03/19/2014  Patient/family provided with [place] Department of Clinical Social Work's list of facilities offering this level of care within the geographic area requested by the patient (or if unable, by the patient's family).  03/19/2014  Patient/family informed of their freedom to choose among providers that offer the needed level of care, that participate in Medicare, Medicaid or managed care program needed by the patient, have an available bed and are willing to accept the patient.  03/19/2014  Patient/family informed of [facility]' ownership interest in [facility], as well as of the fact that they are under no obligation to receive care at this facility.  PASARR submitted to EDS on 03/19/2014 PASARR number received on 03/19/2014  FL2 transmitted to all facilities in geographic area requested by pt/family on  03/19/2014 FL2 transmitted to all facilities within larger geographic area on   Patient informed that his/her managed care company has contracts with or will negotiate with  certain facilities, including the following:     Patient/family informed of bed offers received:  03/19/2014 Patient chooses bed at [place] Physician recommends and patient chooses bed at    Patient to be transferred to [facility] on  03/20/2014 Patient to be transferred to facility by [facility] Patient and family notified of transfer on 03/20/2014 Name of family member notified:  Patient updated at bedside.  The following physician request were entered in  Epic:   Additional Comments:  Henderson [place] [phone]) Licensed Clinical Social Worker Orthopedics [phone]) and Surgical [phone])

## 2014-03-20 NOTE — Discharge Planning (Signed)
Patient to be discharged to [facility]. Patient updated at bedside (with assistance from interpreter).  Facility: [facility] Report number: [phone] Transportation: EMS ([facility])  Lubertha Sayres, [place] ([phone]) Licensed Clinical Social Worker Orthopedics [phone]) and Surgical [phone])

## 2014-03-20 NOTE — Progress Notes (Signed)
Hand off report gave to RN, Irwin Brakeman at [facility]. All questions answered.

## 2014-03-24 ENCOUNTER — Encounter: Payer: Self-pay | Admitting: Adult Health

## 2014-03-24 ENCOUNTER — Non-Acute Institutional Stay (SKILLED_NURSING_FACILITY): Payer: Medicare Other | Admitting: Adult Health

## 2014-03-24 DIAGNOSIS — M25552 Pain in left hip: Secondary | ICD-10-CM

## 2014-03-24 DIAGNOSIS — M1712 Unilateral primary osteoarthritis, left knee: Secondary | ICD-10-CM

## 2014-03-24 DIAGNOSIS — D62 Acute posthemorrhagic anemia: Secondary | ICD-10-CM | POA: Diagnosis not present

## 2014-03-24 DIAGNOSIS — G629 Polyneuropathy, unspecified: Secondary | ICD-10-CM | POA: Diagnosis not present

## 2014-03-24 NOTE — Progress Notes (Signed)
Patient ID: Alan Mendoza, male   DOB: [birth year], 49 y.o.   MRN: [id number]   03/24/2014  Facility:  Nursing Home Location:  [place] Room Number: 203-2 LEVEL OF CARE:  SNF (31)   Chief Complaint  Patient presents with  . Hospitalization Follow-up    DJD S/P left total knee arthroplasty, neuropathy, diabetes mellitus, anemia and left hip pain    HISTORY OF PRESENT ILLNESS:  This is a 49 year old male who is being admitted to [facility] on 03/20/58 from [facility] with DJD S/P left total knee arthroplasty. 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  He complains of left hip pain. No noted swelling nor bruising on the left hip.  He has been admitted for a short-term rehabilitation.  PAST MEDICAL HISTORY:  Past Medical History  Diagnosis Date  . Coronary artery disease   . Myocardial infarction   . Stroke   . Diabetes mellitus without complication     not on meds  . Arthritis   . 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     CURRENT MEDICATIONS: Reviewed per MAR/see medication list  No Known Allergies   REVIEW OF SYSTEMS:  GENERAL: no change in appetite, no fatigue, no weight changes, no fever, chills or weakness RESPIRATORY: no cough, SOB, DOE, wheezing, hemoptysis CARDIAC: no chest pain, edema or palpitations GI: no abdominal pain, diarrhea, constipation, heart burn, nausea or vomiting  PHYSICAL EXAMINATION  GENERAL: no acute distress, normal body habitus SKIN:  Left knee surgical incision is dry, no erythema EYES: conjunctivae normal, sclerae normal, normal eye lids NECK: supple, trachea midline, no neck masses, no thyroid tenderness, no thyromegaly LYMPHATICS: no LAN in the neck, no supraclavicular LAN RESPIRATORY: breathing is even & unlabored, BS CTAB CARDIAC: RRR, no murmur,no extra heart sounds, no edema GI: abdomen soft, normal BS, no masses,  no tenderness, no hepatomegaly, no splenomegaly EXTREMITIES: Able to move 4 extremities PSYCHIATRIC: the patient is alert & oriented to person, affect & behavior appropriate  LABS/RADIOLOGY: Labs reviewed: Basic Metabolic Panel:  Recent Labs  03/05/14 1007 03/18/14 0810  NA 143 135  K 4.3 4.1  CL 108 106  CO2 30 24  GLUCOSE 99 145*  BUN 16 10  CREATININE 0.92 0.78  CALCIUM 9.5 8.6   CBC:  Recent Labs  03/18/14 0810 03/19/14 0740 03/20/14 0514  WBC 6.2 9.5 9.1  HGB 12.0* 11.1* 10.4*  HCT 36.9* 34.2* 31.8*  MCV 86.4 85.3 85.3  PLT 177 185 177   CBG:  Recent Labs  03/19/14 2147 03/20/14 0625 03/20/14 1254  GLUCAP 133* 126* 132*    Dg Chest 2 View  03/05/2014   CLINICAL DATA:  Coronary artery disease.  EXAM: CHEST  2 VIEW  COMPARISON:  None.  FINDINGS: Mediastinum and hilar structures are normal. Right perihilar infiltrate with slight nodularity noted. Followup chest x-ray is recommended to demonstrate clearing. No pleural effusion or pneumothorax. Mild cardiomegaly. Coronary calcifications and/or stents. No pulmonary venous congestion. No acute bony abnormality.  IMPRESSION: 1. Mild right perihilar infiltrate with slight nodularity, findings most consistent pneumonia. Follow-up chest x-ray demonstrates a suggested. 2. Cardiomegaly, no pulmonary venous congestion. Coronary artery disease appear   Electronically Signed   By: [place]   On: 03/05/2014 10:06   Ct Chest Wo Contrast  03/12/2014   CLINICAL DATA:  Aspiration pneumonia. Abnormal chest x-ray. Cough. Smoker.  EXAM: CT  CHEST WITHOUT CONTRAST  TECHNIQUE: Multidetector CT imaging of the chest was performed following the standard protocol without IV contrast.  COMPARISON:  Chest radiographs 03/05/2014  FINDINGS: No enlarged axillary, mediastinal, or hilar lymph nodes are identified. Prior coronary artery stenting is noted. The heart is normal in size. No significant atherosclerotic calcification is identified.  There is no pleural or pericardial effusion.  Major airways appear patent. There is mild to moderate centrilobular emphysema. There is mild bronchial wall thickening. There is a 4 mm nodule in the right lower lobe (series 4, image 38). No airspace consolidation is seen.  The visualized portion of the upper abdomen is unremarkable. Mild thoracic spondylosis is noted.  IMPRESSION: 1. Emphysema. 2. 4 mm right lower lobe nodule. Given risk factors for bronchogenic carcinoma, follow-up chest CT at 1 year is recommended. This recommendation follows the consensus statement: Guidelines for Management of Small Pulmonary Nodules Detected on CT Scans: A Statement from the [place] as published in Radiology 2005; 237:395-400.   Electronically Signed   By: Logan Bores   On: 03/12/2014 08:45    ASSESSMENT/PLAN:  DJD S/P left total knee arthroplasty - for rehabilitation; continue Xarelto 10 mg by mouth daily for DVT prophylaxis; Robaxin 500 mg by mouth every 6 hours when necessary for muscle spasm; and oxycodone 5 mg 1-2 tabs by mouth every 3 hours when necessary for pain Neuropathy - continue Neurontin 100 mg by mouth twice a day Diabetes mellitus, type II - diet controlled Left hip pain - start Lidoderm 5% 1 patch transdermally to left hip daily Anemia, acute blood loss - hemoglobin 10.4; will monitor    Goals of care:  Short-term rehabilitation   Labs/test ordered:  cbc, cmp and X-ray of left hip    Spent 50 minutes in patient care.      [facility], NP [employer] [phone]

## 2014-03-25 ENCOUNTER — Non-Acute Institutional Stay (SKILLED_NURSING_FACILITY): Payer: Medicare Other | Admitting: Internal Medicine

## 2014-03-25 ENCOUNTER — Encounter: Payer: Self-pay | Admitting: Internal Medicine

## 2014-03-25 DIAGNOSIS — E119 Type 2 diabetes mellitus without complications: Secondary | ICD-10-CM

## 2014-03-25 DIAGNOSIS — M25552 Pain in left hip: Secondary | ICD-10-CM

## 2014-03-25 DIAGNOSIS — D62 Acute posthemorrhagic anemia: Secondary | ICD-10-CM

## 2014-03-25 DIAGNOSIS — G4700 Insomnia, unspecified: Secondary | ICD-10-CM

## 2014-03-25 DIAGNOSIS — K5900 Constipation, unspecified: Secondary | ICD-10-CM

## 2014-03-25 DIAGNOSIS — M1712 Unilateral primary osteoarthritis, left knee: Secondary | ICD-10-CM | POA: Diagnosis not present

## 2014-03-25 DIAGNOSIS — M792 Neuralgia and neuritis, unspecified: Secondary | ICD-10-CM | POA: Diagnosis not present

## 2014-03-25 NOTE — Progress Notes (Signed)
Patient ID: Conlin Brahm, male   DOB: 14-Sep-1965, 49 y.o.   MRN: [id number]     [facility]   PCP: Pcp Not In System  Code Status: full code  No Known Allergies  Chief Complaint  Patient presents with  . New Admit To SNF     HPI:  49 year old patient is here for short term rehabilitation post hospital admission from 03/17/14- 03/20/14 with left knee OA. He is deaf and unable to speak. He can read and write and used white board today to communicate. He complaints of muscle tightness. Pain is under control. Last bowel movement on Sunday 3 days back. He has a bowel movement every 3 -4  Days at baseline. Denies any other concerns. 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  HENT: Negative for headache, congestion Eyes: Negative for eye pain, blurred vision, double vision and discharge.  Respiratory: Negative for cough, shortness of breath and wheezing.   Cardiovascular: Negative for chest pain, palpitations, leg swelling.  Gastrointestinal: Negative for heartburn, nausea, vomiting, abdominal pain Genitourinary: Negative for dysuria, flank pain.  Musculoskeletal: Negative for back pain, falls. Skin: Negative for itching, rash.  Neurological: Negative for weakness Psychiatric/Behavioral: Negative for depression. Unable to sleep at night   Past Medical History  Diagnosis Date  . Coronary artery disease   . Myocardial infarction   . Stroke   . Diabetes mellitus without complication     not on meds  . Arthritis   . 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 Knee arthroscopy Left 1984  . Neck surgery    . Cardiac catheterization      stent placement  . Total knee arthroplasty Left 03/17/2014    Procedure: TOTAL KNEE ARTHROPLASTY;  Surgeon: Meredith Pel, MD;  Location: [facility];  Service:  Orthopedics;  Laterality: Left;   Social History:   reports that he has been smoking Cigarettes.  He has a 16.5 pack-year smoking history. He has never used smokeless tobacco. He reports that he does not drink alcohol or use illicit drugs.  History reviewed. No pertinent family history.  Medications: Patient's Medications  New Prescriptions   No medications on file  Previous Medications   ASPIRIN EC 81 MG TABLET    Take 81 mg by mouth daily as needed for mild pain.   GABAPENTIN PO    Take 1 capsule by mouth 2 (two) times daily.   METHOCARBAMOL (ROBAXIN) 500 MG TABLET    Take 1 tablet (500 mg total) by mouth every 6 (six) hours as needed for muscle spasms.   OXYCODONE (OXY IR/ROXICODONE) 5 MG IMMEDIATE RELEASE TABLET    Take 1-2 tablets (5-10 mg total) by mouth every 3 (three) hours as needed for breakthrough pain.   RIVAROXABAN (XARELTO) 10 MG TABS TABLET    Take 1 tablet (10 mg total) by mouth daily with breakfast.  Modified Medications   No medications on file  Discontinued Medications   No medications on file     Physical Exam: Filed Vitals:   03/25/14 1159  BP: 127/66  Pulse: 85  Temp: 98.1 F (36.7 C)  Resp: 16  SpO2: 93%    General- adult male, well built, in no acute distress Head- normocephalic, atraumatic Throat- moist mucus membrane Eyes- no pallor, no icterus, no discharge,  normal conjunctiva, normal sclera Neck- no cervical lymphadenopathy Cardiovascular- normal s1,s2, no murmurs, palpable dorsalis pedis and radial pulses, trace left leg edema Respiratory- bilateral clear to auscultation, no wheeze, no rhonchi, no crackles, no use of accessory muscles Abdomen- bowel sounds present, soft, non tender Musculoskeletal- able to move all 4 extremities, limited rom with left knee   Neurological- no focal deficit Skin- warm and dry, left knee incision healing well, no drainage, dressing clean and dry Psychiatry- alert and oriented to person, place and time, normal  mood and affect    Labs reviewed: Basic Metabolic Panel:  Recent Labs  03/05/14 1007 03/18/14 0810  NA 143 135  K 4.3 4.1  CL 108 106  CO2 30 24  GLUCOSE 99 145*  BUN 16 10  CREATININE 0.92 0.78  CALCIUM 9.5 8.6   CBC:  Recent Labs  03/18/14 0810 03/19/14 0740 03/20/14 0514  WBC 6.2 9.5 9.1  HGB 12.0* 11.1* 10.4*  HCT 36.9* 34.2* 31.8*  MCV 86.4 85.3 85.3  PLT 177 185 177   CBG:  Recent Labs  03/19/14 2147 03/20/14 0625 03/20/14 1254  GLUCAP 133* 126* 132*     Assessment/Plan  Left knee OA S/P left total knee arthroplasty. Will have him work with physical therapy and occupational therapy team to help with gait training and muscle strengthening exercises.fall precautions. Skin care. Encourage to be out of bed. Continue oxycodone 5 mg 1-2 tab q3h prn pain. Change robaxin to 500 mg tid for now. Continue Xarelto 10 mg by mouth daily for DVT prophylaxis. Add ted hose to help with dependent edema  Left hip pain Xray ruled out fracture. On lidocaine patch which has been helpful, monitor  Blood loss anemia Post op, monitor h&h  Insomnia Continue ambien qhs prn for now  Neuropathic pain continue Neurontin 100 mg bid  Diabetes mellitus, type II  diet controlled  Constipation Add colace 100 mg daily and miralax 17 g daily prn with his chronic constipation and being on narcotics.   Labs/test ordered:  cbc, cmp    Goals of care: short term rehabilitation   Family/ staff Communication: reviewed care plan with patient and nursing supervisor    Blanchie Serve, MD  [facility] Adult [facility] [phone] (Monday-Friday 8 am - 5 pm) [phone] (afterhours)

## 2014-04-01 DIAGNOSIS — Z96652 Presence of left artificial knee joint: Secondary | ICD-10-CM | POA: Diagnosis not present

## 2014-04-02 ENCOUNTER — Non-Acute Institutional Stay (SKILLED_NURSING_FACILITY): Payer: Medicare Other | Admitting: Adult Health

## 2014-04-02 ENCOUNTER — Encounter: Payer: Self-pay | Admitting: Adult Health

## 2014-04-02 DIAGNOSIS — E119 Type 2 diabetes mellitus without complications: Secondary | ICD-10-CM

## 2014-04-02 DIAGNOSIS — M1712 Unilateral primary osteoarthritis, left knee: Secondary | ICD-10-CM | POA: Diagnosis not present

## 2014-04-02 DIAGNOSIS — M25552 Pain in left hip: Secondary | ICD-10-CM | POA: Diagnosis not present

## 2014-04-02 DIAGNOSIS — G629 Polyneuropathy, unspecified: Secondary | ICD-10-CM

## 2014-04-02 DIAGNOSIS — G4700 Insomnia, unspecified: Secondary | ICD-10-CM

## 2014-04-02 DIAGNOSIS — D62 Acute posthemorrhagic anemia: Secondary | ICD-10-CM

## 2014-04-02 DIAGNOSIS — K5900 Constipation, unspecified: Secondary | ICD-10-CM

## 2014-04-02 NOTE — Progress Notes (Signed)
Patient ID: Alan Mendoza, male   DOB: 09/25/1965, 49 y.o.   MRN: [id number]   04/02/2014  Facility:  Nursing Home Location:  [place] Room Number: 203-2 LEVEL OF CARE:  SNF (31)   Chief Complaint  Patient presents with  . Discharge Note    DJD S/P left total knee arthroplasty, neuropathy, diabetes mellitus, anemia and left hip pain    HISTORY OF PRESENT ILLNESS:  This is a 49 year old male who is for discharge home and will have outpatient rehabilitation.  DME:  Single point cane.  He has been admitted to [facility] on 03/20/[facility] from [facility] with DJD S/P left total knee arthroplasty. 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  Patient was admitted to this facility for short-term rehabilitation after the patient's recent hospitalization.  Patient has completed SNF rehabilitation and therapy has cleared the patient for discharge.  PAST MEDICAL HISTORY:  Past Medical History  Diagnosis Date  . Coronary artery disease   . Myocardial infarction   . Stroke   . Diabetes mellitus without complication     not on meds  . Arthritis   . 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     CURRENT MEDICATIONS: Reviewed per MAR/see medication list  No Known Allergies   REVIEW OF SYSTEMS:  GENERAL: no change in appetite, no fatigue, no weight changes, no fever, chills or weakness RESPIRATORY: no cough, SOB, DOE, wheezing, hemoptysis CARDIAC: no chest pain, edema or palpitations GI: no abdominal pain, diarrhea, constipation, heart burn, nausea or vomiting  PHYSICAL EXAMINATION  GENERAL: no acute distress, normal body habitus SKIN:  Left knee surgical incision is dry, no erythema NECK: supple, trachea midline, no neck masses, no thyroid tenderness, no thyromegaly LYMPHATICS: no LAN in the neck, no supraclavicular LAN RESPIRATORY: breathing is even & unlabored, BS  CTAB CARDIAC: RRR, no murmur,no extra heart sounds, no edema GI: abdomen soft, normal BS, no masses, no tenderness, no hepatomegaly, no splenomegaly EXTREMITIES: Able to move 4 extremities PSYCHIATRIC: the patient is alert & oriented to person, affect & behavior appropriate  LABS/RADIOLOGY: 03/24/14  WBC 6.1 hemoglobin and 0.1 hematocrit 31.2 MCV 85.5 sodium 138 potassium 4.1 glucose 119 BUN 15 creatinine 0.67 total bilirubin 0.7 alkaline phosphatase 96 SGOT 53 SGPT 69 total protein 5.5 albumin 3.0 calcium 8.4 Labs reviewed: Basic Metabolic Panel:  Recent Labs  03/05/14 1007 03/18/14 0810  NA 143 135  K 4.3 4.1  CL 108 106  CO2 30 24  GLUCOSE 99 145*  BUN 16 10  CREATININE 0.92 0.78  CALCIUM 9.5 8.6   CBC:  Recent Labs  03/18/14 0810 03/19/14 0740 03/20/14 0514  WBC 6.2 9.5 9.1  HGB 12.0* 11.1* 10.4*  HCT 36.9* 34.2* 31.8*  MCV 86.4 85.3 85.3  PLT 177 185 177   CBG:  Recent Labs  03/19/14 2147 03/20/14 0625 03/20/14 1254  GLUCAP 133* 126* 132*    Dg Chest 2 View  03/05/2014   CLINICAL DATA:  Coronary artery disease.  EXAM: CHEST  2 VIEW  COMPARISON:  None.  FINDINGS: Mediastinum and hilar structures are normal. Right perihilar infiltrate with slight nodularity noted. Followup chest x-ray is recommended to demonstrate clearing. No pleural effusion or pneumothorax. Mild cardiomegaly. Coronary calcifications and/or stents. No pulmonary venous congestion. No acute bony abnormality.  IMPRESSION: 1. Mild right perihilar infiltrate with slight nodularity, findings most consistent pneumonia. Follow-up chest x-ray  demonstrates a suggested. 2. Cardiomegaly, no pulmonary venous congestion. Coronary artery disease appear   Electronically Signed   By: [place]   On: 03/05/2014 10:06   Ct Chest Wo Contrast  03/12/2014   CLINICAL DATA:  Aspiration pneumonia. Abnormal chest x-ray. Cough. Smoker.  EXAM: CT CHEST WITHOUT CONTRAST  TECHNIQUE: Multidetector CT imaging of the  chest was performed following the standard protocol without IV contrast.  COMPARISON:  Chest radiographs 03/05/2014  FINDINGS: No enlarged axillary, mediastinal, or hilar lymph nodes are identified. Prior coronary artery stenting is noted. The heart is normal in size. No significant atherosclerotic calcification is identified. There is no pleural or pericardial effusion.  Major airways appear patent. There is mild to moderate centrilobular emphysema. There is mild bronchial wall thickening. There is a 4 mm nodule in the right lower lobe (series 4, image 38). No airspace consolidation is seen.  The visualized portion of the upper abdomen is unremarkable. Mild thoracic spondylosis is noted.  IMPRESSION: 1. Emphysema. 2. 4 mm right lower lobe nodule. Given risk factors for bronchogenic carcinoma, follow-up chest CT at 1 year is recommended. This recommendation follows the consensus statement: Guidelines for Management of Small Pulmonary Nodules Detected on CT Scans: A Statement from the [place] as published in Radiology 2005; 237:395-400.   Electronically Signed   By: Logan Bores   On: 03/12/2014 08:45    ASSESSMENT/PLAN:  DJD S/P left total knee arthroplasty - for rehabilitation; continue Xarelto 10 mg by mouth daily for DVT prophylaxis; Robaxin 500 mg by mouth every 8 hours when necessary for muscle spasm; and oxycodone 5 mg 1-2 tabs by mouth every 3 hours when necessary for pain Neuropathy - continue Neurontin 100 mg by mouth twice a day Diabetes mellitus, type II - diet controlled Left hip pain - continue Lidoderm 5% 1 patch transdermally to left hip daily Anemia, acute blood loss - hemoglobin 10.1; stable Insomnia - continue Ambien 5 mg by mouth daily at bedtime when necessary Constipation - continue Colace 100 mg by mouth daily    I have filled out patient's discharge paperwork and written prescriptions.  Patient will outpatient rehabilitation.  DME provided:  Single point  cane  Total discharge time: Greater than 30 minutes  Discharge time involved coordination of the discharge process with social worker, nursing staff and therapy department. Medical justification for DME verified.     [facility], NP [employer] [phone]

## 2014-04-08 ENCOUNTER — Other Ambulatory Visit: Payer: Self-pay | Admitting: Adult Health

## 2014-04-08 ENCOUNTER — Other Ambulatory Visit (HOSPITAL_COMMUNITY): Payer: Self-pay | Admitting: Orthopedic Surgery

## 2014-04-16 ENCOUNTER — Other Ambulatory Visit (HOSPITAL_COMMUNITY): Payer: Self-pay | Admitting: *Deleted

## 2014-04-16 NOTE — Pre-Procedure Instructions (Addendum)
Alan Mendoza  04/16/2014   Your procedure is scheduled on:  Tuesday, April 21, 2014    Report to [facility] Entrance "A" Admitting Office at 12:40 PM.   Call this number if you have problems the morning of surgery: [phone]               Any questions prior to day of surgery, please call [phone] between 8 & 4 PM.   Remember:   Do not eat food or drink liquids after midnight Monday, 04/20/14.   Take these medicines the morning of surgery with A SIP OF WATER: metoprolol, tylenol if needed, nitrostat if needed  STOP all herbel meds, nsaids (aleve,naproxen,advil,ibuprofen) today including aspirin, vitamins   Do not wear jewelry.  Do not wear lotions, powders, or cologne. You may wear deodorant.  Men may shave face and neck.  Do not bring valuables to the hospital.  [facility] is not responsible                  for any belongings or valuables.               Contacts, dentures or bridgework may not be worn into surgery.  Leave suitcase in the car. After surgery it may be brought to your room.  For patients admitted to the hospital, discharge time is determined by your                treatment team.               Patients discharged the day of surgery will not be allowed to drive home.    Special Instructions: Special Instructions: [place] - Preparing for Surgery  Before surgery, you can play an important role.  Because skin is not sterile, your skin needs to be as free of germs as possible.  You can reduce the number of germs on you skin by washing with CHG (chlorahexidine gluconate) soap before surgery.  CHG is an antiseptic cleaner which kills germs and bonds with the skin to continue killing germs even after washing.  Please DO NOT use if you have an allergy to CHG or antibacterial soaps.  If your skin becomes reddened/irritated stop using the CHG and inform your nurse when you arrive at Short Stay.  Do not shave (including legs and underarms) for at least 48  hours prior to the first CHG shower.  You may shave your face.  Please follow these instructions carefully:   1.  Shower with CHG Soap the night before surgery and the morning of Surgery.  2.  If you choose to wash your hair, wash your hair first as usual with your normal shampoo.  3.  After you shampoo, rinse your hair and body thoroughly to remove the Shampoo.  4.  Use CHG as you would any other liquid soap.  You can apply chg directly  to the skin and wash gently with scrungie or a clean washcloth.  5.  Apply the CHG Soap to your body ONLY FROM THE NECK DOWN.  Do not use on open wounds or open sores.  Avoid contact with your eyes ears, mouth and genitals (private parts).  Wash genitals (private parts)       with your normal soap.  6.  Wash thoroughly, paying special attention to the area where your surgery will be performed.  7.  Thoroughly rinse your body with warm water from the neck down.  8.  DO NOT shower/wash with  your normal soap after using and rinsing off the CHG Soap.  9.  Pat yourself dry with a clean towel.            10.  Wear clean pajamas.            11.  Place clean sheets on your bed the night of your first shower and do not sleep with pets.  Day of Surgery  Do not apply any lotions/deodorants the morning of surgery.  Please wear clean clothes to the hospital/surgery center.    Please read over the following fact sheets that you were given: Pain Booklet, Coughing and Deep Breathing and Surgical Site Infection Prevention

## 2014-04-17 ENCOUNTER — Encounter (HOSPITAL_COMMUNITY)
Admission: RE | Admit: 2014-04-17 | Discharge: 2014-04-17 | Disposition: A | Discharge status: Home / Self Care | Payer: Medicare Other | Source: Ambulatory Visit | Attending: Orthopedic Surgery | Admitting: Orthopedic Surgery

## 2014-04-17 ENCOUNTER — Encounter (HOSPITAL_COMMUNITY): Payer: Self-pay

## 2014-04-17 DIAGNOSIS — M25562 Pain in left knee: Secondary | ICD-10-CM | POA: Diagnosis not present

## 2014-04-17 DIAGNOSIS — Z01812 Encounter for preprocedural laboratory examination: Secondary | ICD-10-CM | POA: Diagnosis not present

## 2014-04-17 DIAGNOSIS — Z96652 Presence of left artificial knee joint: Secondary | ICD-10-CM | POA: Diagnosis not present

## 2014-04-17 LAB — BASIC METABOLIC PANEL
ANION GAP: 8 (ref 5–15)
BUN: 9 mg/dL (ref 6–23)
CALCIUM: 9.4 mg/dL (ref 8.4–10.5)
CHLORIDE: 108 mmol/L (ref 96–112)
CO2: 25 mmol/L (ref 19–32)
Creatinine, Ser: 0.75 mg/dL (ref 0.50–1.35)
GFR calc Af Amer: >90 mL/min (ref >90)
GFR calc non Af Amer: >90 mL/min (ref >90)
GLUCOSE: 124 mg/dL — AB (ref 70–99)
Potassium: 4.4 mmol/L (ref 3.5–5.1)
SODIUM: 141 mmol/L (ref 135–145)

## 2014-04-17 LAB — CBC
HCT: 42.7 % (ref 39.0–52.0)
HEMOGLOBIN: 13.4 g/dL (ref 13.0–17.0)
MCH: 27.8 pg (ref 26.0–34.0)
MCHC: 31.4 g/dL (ref 30.0–36.0)
MCV: 88.6 fL (ref 78.0–100.0)
Platelets: 271 10*3/uL (ref 150–400)
RBC: 4.82 MIL/uL (ref 4.22–5.81)
RDW: 13.9 % (ref 11.5–15.5)
WBC: 6.4 10*3/uL (ref 4.0–10.5)

## 2014-04-17 NOTE — Progress Notes (Addendum)
Patient recently moved from [place] to [place]. No new pcp or cardiac dr here. Prior notes cardiac dr in media tab from 11/15 for total knee clearance. Used cpap after total knee in march until yesterday 04/16/14 screening negative for sleep apnea. No study . Interpretor used for preadmit appt

## 2014-04-20 NOTE — Progress Notes (Signed)
Anesthesia Chart Review: Pt is 49 year old male scheduled for left knee manipulation under anesthesia on 04/21/14 by Dr. Marlou Sa.  He is sp L total knee arthroplasty on 03/17/2014.   He moved from [place], [place] to [place] within the past six months.  He does not yet have a local PCP or cardiologist; however, his previous cardiologist is Dr. Emeline General in [place], [place] cleared him for his left TKA earlier this year.   PMH includes: CAD/NSTEMI s/p DES to CFX '14, stroke (patient denied), diet controlled DM2, arthritis, smoking. He is deaf. BMI 22.4  (Copies of EKG, stress, echo, and cardiac cath are scanned under the Media tab, Correspondence 03/17/14 Encounter.)  EKG 10/23/2013: sinus rhythm. Moderate inferior repolarization disturbance, consider ischemia or LV overload.   Stress test 11/04/2013:  -clinically negative for chest pain -ECG negative for ischemia -adequate stress response -SPECT image consistent with normal study (no fixed or transient myocardial perfusion defect suggestive of ischemia or scar) -LV dimension are normal, post stress LVEF is 70% without any wall motion abnormalities  Echo 10/06/2012: -LV EF is normal (55-60%). Normal LV diastolic function.  -There is no pericardial effusion.  -Moderate to severe mitral regurgitation.   Cardiac cath 10/07/2012: 20% LM, 30% mid CX followed by 90% mid lesion just before the OM2 branch. Left dominant. 30% proximal LAD. RCA small, non-dominant vessel. CONCLUSIONS: -single vessel CAD (mid CFX with 90% stenosis) -successful intravascular US guided PCI of mid L CFX with Promus Premier DES  03/05/14 Chest x-ray: Mediastinum and hilar structures are normal. Right perihilar infiltrate with slight nodularity noted. Followup chest x-ray is recommended to demonstrate clearing. No pleural effusion or pneumothorax. Mild cardiomegaly. Coronary calcifications and/or stents. No pulmonary venous congestion. No acute bony abnormality. (This lead a  a chest CT, see below.)   03/12/14 CT of the chest was done 03/12/14: 1. Emphysema. 2. 4 mm right lower lobe nodule. Given risk factors for bronchogenic carcinoma, follow-up chest CT at 1 year is recommended.  (This was reviewed by Dr. Marlou Sa prior to patient's 03/17/14 surgery.  Follow up arrangements were deferred to the ordering physician Dr. Marlou Sa.)  Preoperative labs reviewed.   He tolerated recent TKA.  If no acute changes then I would anticipate that he could proceed as planned.  George [facility] Short Stay Center/Anesthesiology Phone [phone] 04/20/2014 11:08 AM

## 2014-04-21 ENCOUNTER — Ambulatory Visit (HOSPITAL_COMMUNITY)
Admission: RE | Admit: 2014-04-21 | Discharge: 2014-04-21 | Disposition: A | Discharge status: Home / Self Care | Payer: Medicare Other | Source: Ambulatory Visit | Attending: Orthopedic Surgery | Admitting: Orthopedic Surgery

## 2014-04-21 ENCOUNTER — Ambulatory Visit (HOSPITAL_COMMUNITY): Payer: Medicare Other | Admitting: Anesthesiology

## 2014-04-21 ENCOUNTER — Encounter (HOSPITAL_COMMUNITY)
Admission: RE | Disposition: A | Discharge status: Home / Self Care | Payer: Self-pay | Source: Ambulatory Visit | Attending: Orthopedic Surgery

## 2014-04-21 ENCOUNTER — Encounter (HOSPITAL_COMMUNITY): Payer: Self-pay | Admitting: Certified Registered Nurse Anesthetist

## 2014-04-21 ENCOUNTER — Ambulatory Visit (HOSPITAL_COMMUNITY): Payer: Medicare Other | Admitting: Vascular Surgery

## 2014-04-21 DIAGNOSIS — G8918 Other acute postprocedural pain: Secondary | ICD-10-CM | POA: Diagnosis not present

## 2014-04-21 DIAGNOSIS — Z96652 Presence of left artificial knee joint: Secondary | ICD-10-CM | POA: Insufficient documentation

## 2014-04-21 DIAGNOSIS — F17210 Nicotine dependence, cigarettes, uncomplicated: Secondary | ICD-10-CM | POA: Diagnosis not present

## 2014-04-21 DIAGNOSIS — I252 Old myocardial infarction: Secondary | ICD-10-CM | POA: Insufficient documentation

## 2014-04-21 DIAGNOSIS — Z7982 Long term (current) use of aspirin: Secondary | ICD-10-CM | POA: Diagnosis not present

## 2014-04-21 DIAGNOSIS — Z8673 Personal history of transient ischemic attack (TIA), and cerebral infarction without residual deficits: Secondary | ICD-10-CM | POA: Diagnosis not present

## 2014-04-21 DIAGNOSIS — M1990 Unspecified osteoarthritis, unspecified site: Secondary | ICD-10-CM | POA: Diagnosis not present

## 2014-04-21 DIAGNOSIS — I2510 Atherosclerotic heart disease of native coronary artery without angina pectoris: Secondary | ICD-10-CM | POA: Insufficient documentation

## 2014-04-21 DIAGNOSIS — E119 Type 2 diabetes mellitus without complications: Secondary | ICD-10-CM | POA: Diagnosis not present

## 2014-04-21 DIAGNOSIS — Z955 Presence of coronary angioplasty implant and graft: Secondary | ICD-10-CM | POA: Insufficient documentation

## 2014-04-21 DIAGNOSIS — M24662 Ankylosis, left knee: Secondary | ICD-10-CM | POA: Insufficient documentation

## 2014-04-21 DIAGNOSIS — M1712 Unilateral primary osteoarthritis, left knee: Secondary | ICD-10-CM | POA: Diagnosis not present

## 2014-04-21 HISTORY — PX: EXAM UNDER ANESTHESIA WITH MANIPULATION OF KNEE: SHX5816

## 2014-04-21 LAB — GLUCOSE, CAPILLARY
Glucose-Capillary: 103 mg/dL — ABNORMAL HIGH (ref 70–99)
Glucose-Capillary: 80 mg/dL (ref 70–99)

## 2014-04-21 SURGERY — MANIPULATION, JOINT, KNEE, WITH ANESTHESIA
Anesthesia: Regional | Site: Knee | Laterality: Left

## 2014-04-21 MED ORDER — HYDROMORPHONE HCL 1 MG/ML IJ SOLN
0.2500 mg | INTRAMUSCULAR | Status: DC | PRN
  Administered 2014-04-21: 0.5 mg via INTRAVENOUS

## 2014-04-21 MED ORDER — MORPHINE SULFATE 4 MG/ML IJ SOLN
INTRAMUSCULAR | Status: AC
Start: 2014-04-21 — End: 2014-04-21
  Filled 2014-04-21: qty 2

## 2014-04-21 MED ORDER — MORPHINE SULFATE 4 MG/ML IJ SOLN
INTRAMUSCULAR | Status: DC | PRN
  Administered 2014-04-21: 8 mg

## 2014-04-21 MED ORDER — MIDAZOLAM HCL 2 MG/2ML IJ SOLN
INTRAMUSCULAR | Status: AC
  Administered 2014-04-21: 2 mg via INTRAVENOUS
  Filled 2014-04-21: qty 2

## 2014-04-21 MED ORDER — LIDOCAINE HCL (CARDIAC) 20 MG/ML IV SOLN
INTRAVENOUS | Status: DC | PRN
  Administered 2014-04-21: 50 mg via INTRAVENOUS

## 2014-04-21 MED ORDER — MIDAZOLAM HCL 2 MG/2ML IJ SOLN
INTRAMUSCULAR | Status: AC
  Filled 2014-04-21: qty 2

## 2014-04-21 MED ORDER — FENTANYL CITRATE (PF) 100 MCG/2ML IJ SOLN
50.0000 ug | INTRAMUSCULAR | Status: DC | PRN
  Administered 2014-04-21: 50 ug via INTRAVENOUS

## 2014-04-21 MED ORDER — BUPIVACAINE-EPINEPHRINE (PF) 0.5% -1:200000 IJ SOLN
INTRAMUSCULAR | Status: AC
  Filled 2014-04-21: qty 30

## 2014-04-21 MED ORDER — OXYCODONE HCL 5 MG PO TABS
5.0000 mg | ORAL_TABLET | Freq: Once | ORAL | Status: AC | PRN
Start: 2014-04-21 — End: 2014-04-21
  Administered 2014-04-21: 5 mg via ORAL

## 2014-04-21 MED ORDER — PROPOFOL 10 MG/ML IV BOLUS
INTRAVENOUS | Status: DC | PRN
  Administered 2014-04-21: 150 mg via INTRAVENOUS

## 2014-04-21 MED ORDER — BUPIVACAINE-EPINEPHRINE (PF) 0.5% -1:200000 IJ SOLN
INTRAMUSCULAR | Status: DC | PRN
  Administered 2014-04-21: 30 mL

## 2014-04-21 MED ORDER — PROPOFOL 10 MG/ML IV BOLUS
INTRAVENOUS | Status: AC
  Filled 2014-04-21: qty 20

## 2014-04-21 MED ORDER — OXYCODONE HCL 5 MG PO TABS
ORAL_TABLET | ORAL | Status: AC
  Filled 2014-04-21: qty 1

## 2014-04-21 MED ORDER — OXYCODONE HCL 5 MG/5ML PO SOLN: 5.0000 mg | Freq: Once | ORAL | Status: AC | PRN

## 2014-04-21 MED ORDER — MIDAZOLAM HCL 2 MG/2ML IJ SOLN
1.0000 mg | INTRAMUSCULAR | Status: DC | PRN
  Administered 2014-04-21: 2 mg via INTRAVENOUS

## 2014-04-21 MED ORDER — FENTANYL CITRATE (PF) 100 MCG/2ML IJ SOLN
INTRAMUSCULAR | Status: DC | PRN
  Administered 2014-04-21: 100 ug via INTRAVENOUS

## 2014-04-21 MED ORDER — FENTANYL CITRATE (PF) 250 MCG/5ML IJ SOLN
INTRAMUSCULAR | Status: AC
  Filled 2014-04-21: qty 5

## 2014-04-21 MED ORDER — LACTATED RINGERS IV SOLN
INTRAVENOUS | Status: DC
  Administered 2014-04-21 (×2): via INTRAVENOUS

## 2014-04-21 MED ORDER — OXYCODONE-ACETAMINOPHEN 5-325 MG PO TABS: 1.0000 | ORAL_TABLET | Freq: Four times a day (QID) | ORAL | Status: DC | PRN

## 2014-04-21 MED ORDER — FENTANYL CITRATE (PF) 100 MCG/2ML IJ SOLN
INTRAMUSCULAR | Status: AC
  Administered 2014-04-21: 50 ug via INTRAVENOUS
  Filled 2014-04-21: qty 2

## 2014-04-21 MED ORDER — PROMETHAZINE HCL 25 MG/ML IJ SOLN: 6.2500 mg | INTRAMUSCULAR | Status: DC | PRN

## 2014-04-21 MED ORDER — ONDANSETRON HCL 4 MG/2ML IJ SOLN
INTRAMUSCULAR | Status: DC | PRN
  Administered 2014-04-21: 4 mg via INTRAVENOUS

## 2014-04-21 MED ORDER — HYDROMORPHONE HCL 1 MG/ML IJ SOLN
INTRAMUSCULAR | Status: AC
  Filled 2014-04-21: qty 1

## 2014-04-21 MED ORDER — CLONIDINE HCL (ANALGESIA) 100 MCG/ML EP SOLN
EPIDURAL | Status: DC | PRN
  Administered 2014-04-21: .75 mL

## 2014-04-21 MED ORDER — ROPIVACAINE HCL 5 MG/ML IJ SOLN
INTRAMUSCULAR | Status: DC | PRN
  Administered 2014-04-21: 30 mL via PERINEURAL

## 2014-04-21 SURGICAL SUPPLY — 1 items: BANDAGE ELASTIC 6 VELCRO ST LF (GAUZE/BANDAGES/DRESSINGS) ×3 IMPLANT

## 2014-04-21 NOTE — H&P (Signed)
Alan Mendoza is an 49 y.o. male.   Chief Complaint: Left knee stiffness HPI: Alan Mendoza is a 49 year old patient underwent left total knee replacement about 6 weeks ago. He's had difficulty achieving flexion beyond 70. Presents now for manipulation to achieve more flexion. Components well positioned on radiograph.  Past Medical History  Diagnosis Date  . Coronary artery disease   . Myocardial infarction   . Arthritis   . 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 Stroke     denies  . Diabetes mellitus without complication     not on meds cks blood sugar weekly    Past Surgical History  Procedure Laterality Date  . Knee arthroscopy Left 1984  . Neck surgery  10/14  . Total knee arthroplasty Left 03/17/2014    Procedure: TOTAL KNEE ARTHROPLASTY;  Surgeon: Meredith Pel, MD;  Location: [facility];  Service: Orthopedics;  Laterality: Left;  . Cardiac catheterization  11/14    stent placement    No family history on file. Social History:  reports that he has been smoking Cigarettes.  He has a 16.5 pack-year smoking history. He has never used smokeless tobacco. He reports that he does not drink alcohol or use illicit drugs.  Allergies: No Known Allergies  No prescriptions prior to admission    No results found for this or any previous visit (from the past 48 hour(s)). No results found.  Review of Systems  Constitutional: Negative.   HENT: Negative.   Eyes: Negative.   Respiratory: Negative.   Cardiovascular: Negative.   Gastrointestinal: Negative.   Genitourinary: Negative.   Musculoskeletal: Positive for joint pain.  Skin: Negative.   Neurological: Negative.   Endo/Heme/Allergies: Negative.   Psychiatric/Behavioral: Negative.     There were no vitals taken for this visit. Physical Exam  Constitutional: He appears well-developed.  HENT:  Head: Normocephalic.  Eyes: Pupils are equal, round, and reactive to light.  Neck: Normal  range of motion.  Cardiovascular: Normal rate.   Respiratory: Effort normal.  Neurological: He is alert.  Skin: Skin is warm.  Psychiatric: He has a normal mood and affect.   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  Alan Mendoza 04/21/2014, 11:09 AM

## 2014-04-21 NOTE — Anesthesia Preprocedure Evaluation (Addendum)
Anesthesia Evaluation  Patient identified by MRN, date of birth, ID band Patient awake    Reviewed: Allergy & Precautions, NPO status , Patient's Chart, lab work & pertinent test results  History of Anesthesia Complications Negative for: history of anesthetic complications  Airway Mallampati: I  TM Distance: >3 FB Neck ROM: Full    Dental  (+) Edentulous Upper, Dental Advisory Given   Pulmonary Current Smoker,    Pulmonary exam normal       Cardiovascular + CAD, + Past MI and + Cardiac Stents  Stress test 11/04/2013:  -clinically negative for chest pain -ECG negative for ischemia -adequate stress response -SPECT image consistent with normal study (no fixed or transient myocardial perfusion defect suggestive of ischemia or scar) -LV dimension are normal, post stress LVEF is 70% without any wall motion abnormalities  Echo 10/06/2012: -LV EF is normal (55-60%). Normal LV diastolic function.  -There is no pericardial effusion.  -Moderate to severe mitral regurgitation.   Cardiac cath 10/07/2012: 20% LM, 30% mid CX followed by 90% mid lesion just before the OM2 branch. Left dominant. 30% proximal LAD. RCA small, non-dominant vessel. CONCLUSIONS: -single vessel CAD (mid CFX with 90% stenosis) -successful intravascular US guided PCI of mid L CFX with Promus Premier DES   Neuro/Psych CVA negative psych ROS   GI/Hepatic negative GI ROS, Neg liver ROS,   Endo/Other  diabetes  Renal/GU negative Renal ROS     Musculoskeletal   Abdominal   Peds  Hematology   Anesthesia Other Findings   Reproductive/Obstetrics                           Anesthesia Physical Anesthesia Plan  ASA: III  Anesthesia Plan: General   Post-op Pain Management:    Induction: Intravenous  Airway Management Planned: LMA  Additional Equipment:   Intra-op Plan:   Post-operative Plan: Extubation in OR  Informed  Consent: I have reviewed the patients History and Physical, chart, labs and discussed the procedure including the risks, benefits and alternatives for the proposed anesthesia with the patient or authorized representative who has indicated his/her understanding and acceptance.   Dental advisory given  Plan Discussed with: CRNA and Anesthesiologist  Anesthesia Plan Comments: (Interpreter was used for the interview)       Anesthesia Quick Evaluation

## 2014-04-21 NOTE — Transfer of Care (Signed)
Immediate Anesthesia Transfer of Care Note  Patient: Alan Mendoza  Procedure(s) Performed: Procedure(s) with comments: EXAM UNDER ANESTHESIA WITH MANIPULATION OF KNEE (Left) - LEFT KNEE MANIPULATION UNDER ANESTHESIA  Patient Location: PACU  Anesthesia Type:General and Regional  Level of Consciousness: awake, alert , oriented and patient cooperative  Airway & Oxygen Therapy: Patient Spontanous Breathing and Patient connected to nasal cannula oxygen  Post-op Assessment: Report given to RN, Post -op Vital signs reviewed and stable, Patient moving all extremities and Patient moving all extremities X 4  Post vital signs: Reviewed and stable  Last Vitals:  Filed Vitals:   04/21/14 1715  BP: 102/61  Pulse: 68  Temp:   Resp: 16    Complications: No apparent anesthesia complications

## 2014-04-21 NOTE — Brief Op Note (Signed)
04/21/2014  5:06 PM  PATIENT:  Alan Mendoza  49 y.o. male  PRE-OPERATIVE DIAGNOSIS:  LEFT KNEE ARTHROFIBROSIS  POST-OPERATIVE DIAGNOSIS:  LEFT KNEE ARTHROFIBROSIS  PROCEDURE:  Procedure(s): EXAM UNDER ANESTHESIA WITH MANIPULATION OF KNEE  SURGEON:  Surgeon(s): Meredith Pel, MD  ASSISTANT: none4/19/2016  5:06 PM  PATIENT:  Alan Mendoza  49 y.o. male  PRE-OPERATIVE DIAGNOSIS:  LEFT KNEE ARTHROFIBROSIS  POST-OPERATIVE DIAGNOSIS:  LEFT KNEE ARTHROFIBROSIS  PROCEDURE:  Procedure(s): EXAM UNDER ANESTHESIA WITH MANIPULATION OF KNEE  SURGEON:  Surgeon(s): Meredith Pel, MD  ASSISTANT: none  ANESTHESIA:   general  EBL: 0 ml       BLOOD ADMINISTERED: none  DRAINS: none   LOCAL MEDICATIONS USED:  none  SPECIMEN:  No Specimen  COUNTS:  YES  TOURNIQUET:  * No tourniquets in log *  DICTATION: .Other Dictation: Dictation Number done  PLAN OF CARE: Discharge to home after PACU  PATIENT DISPOSITION:  PACU - hemodynamically stable                ANESTHESIA:   general  EBL: none ml       BLOOD ADMINISTERED: none  DRAINS: none   LOCAL MEDICATIONS USED:    SPECIMEN:  No Specimen  COUNTS:  YES  TOURNIQUET:  * No tourniquets in log *  DICTATION: .Other Dictation: Dictation Number [id number]  PLAN OF CARE: Discharge to home after PACU  PATIENT DISPOSITION:  PACU - hemodynamically stable

## 2014-04-21 NOTE — Anesthesia Procedure Notes (Addendum)
Anesthesia Regional Block:  Adductor canal block  Pre-Anesthetic Checklist: ,, timeout performed, Correct Patient, Correct Site, Correct Laterality, Correct Procedure, Correct Position, site marked, Risks and benefits discussed, pre-op evaluation,  At surgeon's request and post-op pain management  Laterality: Left  Prep: Maximum Sterile Barrier Precautions used and chloraprep       Needles:  Injection technique: Single-shot  Needle Type: Echogenic Stimulator Needle     Needle Length: 5cm 5 cm Needle Gauge: 22 and 22 G    Additional Needles:  Procedures: ultrasound guided (picture in chart) Adductor canal block Narrative:  Start time: 04/21/2014 4:20 PM End time: 04/21/2014 4:28 PM Injection made incrementally with aspirations every 5 mL. Anesthesiologist: Roderic Palau  Additional Notes: 2% Lidocaine skin wheel.    Procedure Name: LMA Insertion Date/Time: 04/21/2014 4:45 PM Performed by: Shirlyn Goltz Pre-anesthesia Checklist: Patient identified, Emergency Drugs available, Suction available and Patient being monitored Patient Re-evaluated:Patient Re-evaluated prior to inductionPreoxygenation: Pre-oxygenation with 100% oxygen Intubation Type: IV induction Ventilation: Mask ventilation without difficulty LMA Size: 4.0 Number of attempts: 2 (lma #5 placed but did not seat well.  LMA #4 placed with adequate ventilation) Placement Confirmation: positive ETCO2 Tube secured with: Tape

## 2014-04-21 NOTE — Anesthesia Postprocedure Evaluation (Signed)
  Anesthesia Post-op Note  Patient: Alan Mendoza  Procedure(s) Performed: Procedure(s) with comments: EXAM UNDER ANESTHESIA WITH MANIPULATION OF KNEE (Left) - LEFT KNEE MANIPULATION UNDER ANESTHESIA  Patient Location: PACU  Anesthesia Type:General and block  Level of Consciousness: awake and alert   Airway and Oxygen Therapy: Patient Spontanous Breathing  Post-op Pain: mild  Post-op Assessment: Post-op Vital signs reviewed, Patient's Cardiovascular Status Stable and Respiratory Function Stable  Post-op Vital Signs: Reviewed  Filed Vitals:   04/21/14 1730  BP: 128/73  Pulse: 80  Temp:   Resp: 19    Complications: No apparent anesthesia complications

## 2014-04-22 ENCOUNTER — Encounter (HOSPITAL_COMMUNITY): Payer: Self-pay | Admitting: Orthopedic Surgery

## 2014-04-22 NOTE — Op Note (Signed)
NAME:  Alan Mendoza, Alan Mendoza               ACCOUNT NO.:  [id number]  MEDICAL RECORD NO.:  [id number]  LOCATION:  [facility]                         FACILITY:  [facility]  PHYSICIAN:  Anderson Malta, M.D.    DATE OF BIRTH:  [birth year]  DATE OF PROCEDURE:  04/21/2014 DATE OF DISCHARGE:  04/21/2014                              OPERATIVE REPORT   PREOPERATIVE DIAGNOSIS:  Left knee arthrofibrosis.  POSTOPERATIVE DIAGNOSIS:  Left knee arthrofibrosis.  PROCEDURE:  Left knee manipulation under anesthesia.  SURGEON:  Anderson Malta, M.D.  ASSISTANT:  None.  ANESTHESIA:  General.  INDICATIONS:  [name] is a patient less than about 4 weeks out left total knee replacement developing arthrofibrosis, presents now for operative management, manipulation under anesthesia after explanation of risks and benefits.  PROCEDURE IN DETAIL:  The patient was brought to the operating room where general anesthetic was induced.  Time-out was called.  Left knee was manipulated from about 70 degrees of flexion to about 110-115.  The patient had some cracking of tissues, knee remained stable.  Knee was then injected with morphine, Marcaine, clonidine after sterile prep with alcohol and Betadine.  Knee wrap was applied.  The patient tolerated the procedure well without immediate complications.  The patient was transferred to the recovery room in stable condition.     Anderson Malta, M.D.     GSD/MEDQ  D:  04/21/2014  T:  04/22/2014  Job:  [id number]

## 2014-04-22 NOTE — OR Nursing (Signed)
Patient d/c'd and dilaudid .5 mg wasted in trash as witnessed by [employer].

## 2014-04-25 DIAGNOSIS — Z471 Aftercare following joint replacement surgery: Secondary | ICD-10-CM | POA: Diagnosis not present

## 2014-04-25 DIAGNOSIS — I2510 Atherosclerotic heart disease of native coronary artery without angina pectoris: Secondary | ICD-10-CM | POA: Diagnosis not present

## 2014-04-25 DIAGNOSIS — H918X3 Other specified hearing loss, bilateral: Secondary | ICD-10-CM | POA: Diagnosis not present

## 2014-04-25 DIAGNOSIS — I252 Old myocardial infarction: Secondary | ICD-10-CM | POA: Diagnosis not present

## 2014-04-25 DIAGNOSIS — M1990 Unspecified osteoarthritis, unspecified site: Secondary | ICD-10-CM | POA: Diagnosis not present

## 2014-04-25 DIAGNOSIS — E119 Type 2 diabetes mellitus without complications: Secondary | ICD-10-CM | POA: Diagnosis not present

## 2014-04-28 DIAGNOSIS — I252 Old myocardial infarction: Secondary | ICD-10-CM | POA: Diagnosis not present

## 2014-04-28 DIAGNOSIS — M1990 Unspecified osteoarthritis, unspecified site: Secondary | ICD-10-CM | POA: Diagnosis not present

## 2014-04-28 DIAGNOSIS — E119 Type 2 diabetes mellitus without complications: Secondary | ICD-10-CM | POA: Diagnosis not present

## 2014-04-28 DIAGNOSIS — H918X3 Other specified hearing loss, bilateral: Secondary | ICD-10-CM | POA: Diagnosis not present

## 2014-04-28 DIAGNOSIS — I2510 Atherosclerotic heart disease of native coronary artery without angina pectoris: Secondary | ICD-10-CM | POA: Diagnosis not present

## 2014-04-28 DIAGNOSIS — Z471 Aftercare following joint replacement surgery: Secondary | ICD-10-CM | POA: Diagnosis not present

## 2014-04-29 DIAGNOSIS — I2510 Atherosclerotic heart disease of native coronary artery without angina pectoris: Secondary | ICD-10-CM | POA: Diagnosis not present

## 2014-04-29 DIAGNOSIS — M1990 Unspecified osteoarthritis, unspecified site: Secondary | ICD-10-CM | POA: Diagnosis not present

## 2014-04-29 DIAGNOSIS — I252 Old myocardial infarction: Secondary | ICD-10-CM | POA: Diagnosis not present

## 2014-04-29 DIAGNOSIS — H918X3 Other specified hearing loss, bilateral: Secondary | ICD-10-CM | POA: Diagnosis not present

## 2014-04-29 DIAGNOSIS — Z471 Aftercare following joint replacement surgery: Secondary | ICD-10-CM | POA: Diagnosis not present

## 2014-04-29 DIAGNOSIS — E119 Type 2 diabetes mellitus without complications: Secondary | ICD-10-CM | POA: Diagnosis not present

## 2014-05-04 DIAGNOSIS — Z471 Aftercare following joint replacement surgery: Secondary | ICD-10-CM | POA: Diagnosis not present

## 2014-05-04 DIAGNOSIS — H918X3 Other specified hearing loss, bilateral: Secondary | ICD-10-CM | POA: Diagnosis not present

## 2014-05-04 DIAGNOSIS — E119 Type 2 diabetes mellitus without complications: Secondary | ICD-10-CM | POA: Diagnosis not present

## 2014-05-04 DIAGNOSIS — I2510 Atherosclerotic heart disease of native coronary artery without angina pectoris: Secondary | ICD-10-CM | POA: Diagnosis not present

## 2014-05-04 DIAGNOSIS — M1990 Unspecified osteoarthritis, unspecified site: Secondary | ICD-10-CM | POA: Diagnosis not present

## 2014-05-04 DIAGNOSIS — I252 Old myocardial infarction: Secondary | ICD-10-CM | POA: Diagnosis not present

## 2014-05-05 DIAGNOSIS — H918X3 Other specified hearing loss, bilateral: Secondary | ICD-10-CM | POA: Diagnosis not present

## 2014-05-05 DIAGNOSIS — M1990 Unspecified osteoarthritis, unspecified site: Secondary | ICD-10-CM | POA: Diagnosis not present

## 2014-05-05 DIAGNOSIS — I252 Old myocardial infarction: Secondary | ICD-10-CM | POA: Diagnosis not present

## 2014-05-05 DIAGNOSIS — Z471 Aftercare following joint replacement surgery: Secondary | ICD-10-CM | POA: Diagnosis not present

## 2014-05-05 DIAGNOSIS — I2510 Atherosclerotic heart disease of native coronary artery without angina pectoris: Secondary | ICD-10-CM | POA: Diagnosis not present

## 2014-05-05 DIAGNOSIS — E119 Type 2 diabetes mellitus without complications: Secondary | ICD-10-CM | POA: Diagnosis not present

## 2014-05-07 DIAGNOSIS — I252 Old myocardial infarction: Secondary | ICD-10-CM | POA: Diagnosis not present

## 2014-05-07 DIAGNOSIS — I2510 Atherosclerotic heart disease of native coronary artery without angina pectoris: Secondary | ICD-10-CM | POA: Diagnosis not present

## 2014-05-07 DIAGNOSIS — Z471 Aftercare following joint replacement surgery: Secondary | ICD-10-CM | POA: Diagnosis not present

## 2014-05-07 DIAGNOSIS — E119 Type 2 diabetes mellitus without complications: Secondary | ICD-10-CM | POA: Diagnosis not present

## 2014-05-07 DIAGNOSIS — H918X3 Other specified hearing loss, bilateral: Secondary | ICD-10-CM | POA: Diagnosis not present

## 2014-05-07 DIAGNOSIS — M1990 Unspecified osteoarthritis, unspecified site: Secondary | ICD-10-CM | POA: Diagnosis not present

## 2014-05-11 DIAGNOSIS — I252 Old myocardial infarction: Secondary | ICD-10-CM | POA: Diagnosis not present

## 2014-05-11 DIAGNOSIS — I2510 Atherosclerotic heart disease of native coronary artery without angina pectoris: Secondary | ICD-10-CM | POA: Diagnosis not present

## 2014-05-11 DIAGNOSIS — Z471 Aftercare following joint replacement surgery: Secondary | ICD-10-CM | POA: Diagnosis not present

## 2014-05-11 DIAGNOSIS — E119 Type 2 diabetes mellitus without complications: Secondary | ICD-10-CM | POA: Diagnosis not present

## 2014-05-11 DIAGNOSIS — M1990 Unspecified osteoarthritis, unspecified site: Secondary | ICD-10-CM | POA: Diagnosis not present

## 2014-05-11 DIAGNOSIS — H918X3 Other specified hearing loss, bilateral: Secondary | ICD-10-CM | POA: Diagnosis not present

## 2014-05-12 DIAGNOSIS — I252 Old myocardial infarction: Secondary | ICD-10-CM | POA: Diagnosis not present

## 2014-05-12 DIAGNOSIS — M1990 Unspecified osteoarthritis, unspecified site: Secondary | ICD-10-CM | POA: Diagnosis not present

## 2014-05-12 DIAGNOSIS — Z471 Aftercare following joint replacement surgery: Secondary | ICD-10-CM | POA: Diagnosis not present

## 2014-05-12 DIAGNOSIS — I2510 Atherosclerotic heart disease of native coronary artery without angina pectoris: Secondary | ICD-10-CM | POA: Diagnosis not present

## 2014-05-12 DIAGNOSIS — H918X3 Other specified hearing loss, bilateral: Secondary | ICD-10-CM | POA: Diagnosis not present

## 2014-05-12 DIAGNOSIS — E119 Type 2 diabetes mellitus without complications: Secondary | ICD-10-CM | POA: Diagnosis not present

## 2014-05-15 DIAGNOSIS — H918X3 Other specified hearing loss, bilateral: Secondary | ICD-10-CM | POA: Diagnosis not present

## 2014-05-15 DIAGNOSIS — Z471 Aftercare following joint replacement surgery: Secondary | ICD-10-CM | POA: Diagnosis not present

## 2014-05-15 DIAGNOSIS — I252 Old myocardial infarction: Secondary | ICD-10-CM | POA: Diagnosis not present

## 2014-05-15 DIAGNOSIS — M1990 Unspecified osteoarthritis, unspecified site: Secondary | ICD-10-CM | POA: Diagnosis not present

## 2014-05-15 DIAGNOSIS — I2510 Atherosclerotic heart disease of native coronary artery without angina pectoris: Secondary | ICD-10-CM | POA: Diagnosis not present

## 2014-05-15 DIAGNOSIS — E119 Type 2 diabetes mellitus without complications: Secondary | ICD-10-CM | POA: Diagnosis not present

## 2014-05-18 DIAGNOSIS — H918X3 Other specified hearing loss, bilateral: Secondary | ICD-10-CM | POA: Diagnosis not present

## 2014-05-18 DIAGNOSIS — Z471 Aftercare following joint replacement surgery: Secondary | ICD-10-CM | POA: Diagnosis not present

## 2014-05-18 DIAGNOSIS — I252 Old myocardial infarction: Secondary | ICD-10-CM | POA: Diagnosis not present

## 2014-05-18 DIAGNOSIS — E119 Type 2 diabetes mellitus without complications: Secondary | ICD-10-CM | POA: Diagnosis not present

## 2014-05-18 DIAGNOSIS — M1990 Unspecified osteoarthritis, unspecified site: Secondary | ICD-10-CM | POA: Diagnosis not present

## 2014-05-18 DIAGNOSIS — I2510 Atherosclerotic heart disease of native coronary artery without angina pectoris: Secondary | ICD-10-CM | POA: Diagnosis not present

## 2014-05-19 DIAGNOSIS — I2510 Atherosclerotic heart disease of native coronary artery without angina pectoris: Secondary | ICD-10-CM | POA: Diagnosis not present

## 2014-05-19 DIAGNOSIS — E119 Type 2 diabetes mellitus without complications: Secondary | ICD-10-CM | POA: Diagnosis not present

## 2014-05-19 DIAGNOSIS — M1990 Unspecified osteoarthritis, unspecified site: Secondary | ICD-10-CM | POA: Diagnosis not present

## 2014-05-19 DIAGNOSIS — H918X3 Other specified hearing loss, bilateral: Secondary | ICD-10-CM | POA: Diagnosis not present

## 2014-05-19 DIAGNOSIS — I252 Old myocardial infarction: Secondary | ICD-10-CM | POA: Diagnosis not present

## 2014-05-19 DIAGNOSIS — Z471 Aftercare following joint replacement surgery: Secondary | ICD-10-CM | POA: Diagnosis not present

## 2014-05-21 DIAGNOSIS — I2510 Atherosclerotic heart disease of native coronary artery without angina pectoris: Secondary | ICD-10-CM | POA: Diagnosis not present

## 2014-05-21 DIAGNOSIS — H918X3 Other specified hearing loss, bilateral: Secondary | ICD-10-CM | POA: Diagnosis not present

## 2014-05-21 DIAGNOSIS — I252 Old myocardial infarction: Secondary | ICD-10-CM | POA: Diagnosis not present

## 2014-05-21 DIAGNOSIS — Z471 Aftercare following joint replacement surgery: Secondary | ICD-10-CM | POA: Diagnosis not present

## 2014-05-21 DIAGNOSIS — M1990 Unspecified osteoarthritis, unspecified site: Secondary | ICD-10-CM | POA: Diagnosis not present

## 2014-05-21 DIAGNOSIS — E119 Type 2 diabetes mellitus without complications: Secondary | ICD-10-CM | POA: Diagnosis not present

## 2014-05-26 DIAGNOSIS — E119 Type 2 diabetes mellitus without complications: Secondary | ICD-10-CM | POA: Diagnosis not present

## 2014-05-26 DIAGNOSIS — M1990 Unspecified osteoarthritis, unspecified site: Secondary | ICD-10-CM | POA: Diagnosis not present

## 2014-05-26 DIAGNOSIS — H918X3 Other specified hearing loss, bilateral: Secondary | ICD-10-CM | POA: Diagnosis not present

## 2014-05-26 DIAGNOSIS — I2510 Atherosclerotic heart disease of native coronary artery without angina pectoris: Secondary | ICD-10-CM | POA: Diagnosis not present

## 2014-05-26 DIAGNOSIS — Z471 Aftercare following joint replacement surgery: Secondary | ICD-10-CM | POA: Diagnosis not present

## 2014-05-26 DIAGNOSIS — I252 Old myocardial infarction: Secondary | ICD-10-CM | POA: Diagnosis not present

## 2014-05-27 DIAGNOSIS — H918X3 Other specified hearing loss, bilateral: Secondary | ICD-10-CM | POA: Diagnosis not present

## 2014-05-27 DIAGNOSIS — E119 Type 2 diabetes mellitus without complications: Secondary | ICD-10-CM | POA: Diagnosis not present

## 2014-05-27 DIAGNOSIS — I252 Old myocardial infarction: Secondary | ICD-10-CM | POA: Diagnosis not present

## 2014-05-27 DIAGNOSIS — Z471 Aftercare following joint replacement surgery: Secondary | ICD-10-CM | POA: Diagnosis not present

## 2014-05-27 DIAGNOSIS — M1990 Unspecified osteoarthritis, unspecified site: Secondary | ICD-10-CM | POA: Diagnosis not present

## 2014-05-27 DIAGNOSIS — I2510 Atherosclerotic heart disease of native coronary artery without angina pectoris: Secondary | ICD-10-CM | POA: Diagnosis not present

## 2014-05-28 DIAGNOSIS — I2510 Atherosclerotic heart disease of native coronary artery without angina pectoris: Secondary | ICD-10-CM | POA: Diagnosis not present

## 2014-05-28 DIAGNOSIS — H918X3 Other specified hearing loss, bilateral: Secondary | ICD-10-CM | POA: Diagnosis not present

## 2014-05-28 DIAGNOSIS — Z471 Aftercare following joint replacement surgery: Secondary | ICD-10-CM | POA: Diagnosis not present

## 2014-05-28 DIAGNOSIS — I252 Old myocardial infarction: Secondary | ICD-10-CM | POA: Diagnosis not present

## 2014-05-28 DIAGNOSIS — M1990 Unspecified osteoarthritis, unspecified site: Secondary | ICD-10-CM | POA: Diagnosis not present

## 2014-05-28 DIAGNOSIS — E119 Type 2 diabetes mellitus without complications: Secondary | ICD-10-CM | POA: Diagnosis not present

## 2014-06-09 ENCOUNTER — Encounter: Payer: Self-pay | Admitting: Physical Therapy

## 2014-06-09 ENCOUNTER — Ambulatory Visit: Payer: Medicare Other | Attending: Orthopedic Surgery | Admitting: Physical Therapy

## 2014-06-09 DIAGNOSIS — M25662 Stiffness of left knee, not elsewhere classified: Secondary | ICD-10-CM | POA: Insufficient documentation

## 2014-06-09 DIAGNOSIS — M25562 Pain in left knee: Secondary | ICD-10-CM | POA: Insufficient documentation

## 2014-06-09 NOTE — Therapy (Signed)
[facility] [address] [place], [place], [address] Phone: [phone]   Fax:  [phone]  Physical Therapy Evaluation  Patient Details  Name: Alan Mendoza MRN: [id number] Date of Birth: August 06, 1965 Referring Provider:  Meredith Pel, MD  Encounter Date: 06/09/2014      PT End of Session - 06/09/14 0906    Visit Number 1   Date for PT Re-Evaluation 08/09/14   PT Start Time 4854   PT Stop Time 0931   PT Time Calculation (min) 40 min   Activity Tolerance Patient tolerated treatment well      Past Medical History  Diagnosis Date  . Coronary artery disease   . Myocardial infarction   . Arthritis   . 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 Stroke     denies  . Diabetes mellitus without complication     not on meds cks blood sugar weekly    Past Surgical History  Procedure Laterality Date  . Knee arthroscopy Left 1984  . Neck surgery  10/14  . Total knee arthroplasty Left 03/17/2014    Procedure: TOTAL KNEE ARTHROPLASTY;  Surgeon: Meredith Pel, MD;  Location: [facility];  Service: Orthopedics;  Laterality: Left;  . Cardiac catheterization  11/14    stent placement  . Exam under anesthesia with manipulation of knee Left 04/21/2014    Procedure: EXAM UNDER ANESTHESIA WITH MANIPULATION OF KNEE;  Surgeon: Meredith Pel, MD;  Location: [facility];  Service: Orthopedics;  Laterality: Left;  LEFT KNEE MANIPULATION UNDER ANESTHESIA    There were no vitals filed for this visit.  Visit Diagnosis:  Left knee pain - Plan: PT plan of care cert/re-cert  Knee stiffness, left - Plan: PT plan of care cert/re-cert      Subjective Assessment - 06/09/14 0848    Subjective Patient underwent a left TKR in March, he did not get good ROM and had a manipulation in April.  Reports that he had PT at home.  Still with significant c/o pain at night   Limitations Standing;Walking;House  hold activities   How long can you stand comfortably? 15   Patient Stated Goals less pain and sleep better   Currently in Pain? Yes   Pain Score 10-Worst pain ever  reports minimal pain with movements, worse at night iwth trying to sleep   Pain Location Knee   Pain Orientation Left   Pain Descriptors / Indicators Aching;Burning;Sharp   Pain Onset 1 to 4 weeks ago   Pain Frequency Intermittent   Aggravating Factors  trying to sleep  or lying down   Pain Relieving Factors easy movements   Effect of Pain on Daily Activities can't sleep            [facility] PT Assessment - 06/09/14 0001    Assessment   Medical Diagnosis left knee replacement   Onset Date/Surgical Date 04/21/14   Prior Therapy at home   Precautions   Precautions None   Precaution Comments Deaf   Restrictions   Weight Bearing Restrictions No   Balance Screen   Has the patient fallen in the past 6 months No   Has the patient had a decrease in activity level because of a fear of falling?  No   Is the patient reluctant to leave their home because of a fear of falling?  No   Home Environment   Additional  Comments does some housework and light yardwork   Prior Function   Vocation Unemployed   ROM / Strength   AROM / PROM / Strength --  PROM 4-92 degrees flexion   AROM   Left Knee Extension 8   Left Knee Flexion 88   Palpation   Patella mobility good   Palpation comment non tender, good scar movility   Ambulation/Gait   Gait Comments very stiff left leg, no bend when walking, no assistive device                   [facility] Adult PT Treatment/Exercise - 06/17/2014 0001    Knee/Hip Exercises: Aerobic   Isokinetic NuStep Level 5 x 6 minutes   Knee/Hip Exercises: Machines for Strengthening   Cybex Knee Extension 5# 2x15   Cybex Knee Flexion 25# 2x15   Cybex Leg Press 20# 2x15                PT Education - 2014/06/17 0905    Education provided Yes   Education Details Low load long duration flexion  and extension   Person(s) Educated Patient   Methods Explanation;Demonstration;Handout   Comprehension Verbalized understanding;Returned demonstration          PT Short Term Goals - 17-Jun-2014 0929    PT SHORT TERM GOAL #1   Title independent with initial HEP   Time 2   Period Weeks   Status New           PT Long Term Goals - 06/17/14 9604    PT LONG TERM GOAL #1   Title increase AROM of the left knee to 4-108 degrees flexion   Time 8   Period Weeks   Status New   PT LONG TERM GOAL #2   Title decrease pain at night 25%   Time 8   Period Weeks   Status New   PT LONG TERM GOAL #3   Title walk with minimal deviation   Time 8   Period Weeks   Status New   PT LONG TERM GOAL #4   Title report no pain during the day   Time 8   Period Weeks   Status New               Plan - 06/17/2014 0926    Clinical Impression Statement Patient with a TKR in March, a manipulation in April.  Still with very poor ROM, poor gait.  AROM was 8-87 degrees flexion   Pt will benefit from skilled therapeutic intervention in order to improve on the following deficits Abnormal gait;Decreased mobility;Decreased range of motion;Difficulty walking;Impaired flexibility;Pain   Rehab Potential Good   PT Frequency 2x / week   PT Duration 8 weeks   PT Treatment/Interventions ADLs/Self Care Home Management;Electrical Stimulation;Cryotherapy;Ultrasound;Gait training;Therapeutic exercise;Functional mobility training;Stair training;Patient/family education;Passive range of motion;Manual techniques   PT Next Visit Plan continue to add exercises, may try estim   Consulted and Agree with Plan of Care Patient          G-Codes - 17-Jun-2014 1204    Functional Assessment Tool Used FOTO   Functional Limitation Mobility: Walking and moving around   Mobility: Walking and Moving Around Current Status [phone]) At least 40 percent but less than 60 percent impaired, limited or restricted   Mobility: Walking and  Moving Around Goal Status [phone]) At least 20 percent but less than 40 percent impaired, limited or restricted       Problem List Patient Active Problem List  Diagnosis Date Noted  . Neuropathy 03/24/2014  . Degenerative arthritis of knee 03/17/2014    Sumner Boast., PT 06/09/2014, 12:10 PM  [address] [place], [place], [address] Phone: [phone]   Fax:  [phone]

## 2014-06-16 ENCOUNTER — Ambulatory Visit: Payer: Medicare Other | Admitting: Physical Therapy

## 2014-06-16 ENCOUNTER — Encounter: Payer: Self-pay | Admitting: Physical Therapy

## 2014-06-16 DIAGNOSIS — M25562 Pain in left knee: Secondary | ICD-10-CM | POA: Diagnosis not present

## 2014-06-16 DIAGNOSIS — M25662 Stiffness of left knee, not elsewhere classified: Secondary | ICD-10-CM

## 2014-06-16 NOTE — Therapy (Signed)
[facility] [address] [place], [place], [address] Phone: [phone]   Fax:  [phone]  Physical Therapy Treatment  Patient Details  Name: Alan Mendoza MRN: [id number] Date of Birth: 06-Oct-1965 Referring Provider:  Meredith Pel, MD  Encounter Date: 06/16/2014      PT End of Session - 06/16/14 0844    Visit Number 2   Date for PT Re-Evaluation 08/09/14   PT Start Time 0800   PT Stop Time 0845   PT Time Calculation (min) 45 min      Past Medical History  Diagnosis Date  . Coronary artery disease   . Myocardial infarction   . Arthritis   . 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 Stroke     denies  . Diabetes mellitus without complication     not on meds cks blood sugar weekly    Past Surgical History  Procedure Laterality Date  . Knee arthroscopy Left 1984  . Neck surgery  10/14  . Total knee arthroplasty Left 03/17/2014    Procedure: TOTAL KNEE ARTHROPLASTY;  Surgeon: Meredith Pel, MD;  Location: [facility];  Service: Orthopedics;  Laterality: Left;  . Cardiac catheterization  11/14    stent placement  . Exam under anesthesia with manipulation of knee Left 04/21/2014    Procedure: EXAM UNDER ANESTHESIA WITH MANIPULATION OF KNEE;  Surgeon: Meredith Pel, MD;  Location: [facility];  Service: Orthopedics;  Laterality: Left;  LEFT KNEE MANIPULATION UNDER ANESTHESIA    There were no vitals filed for this visit.  Visit Diagnosis:  Knee stiffness, left      Subjective Assessment - 06/16/14 0809    Subjective bad few days not sleeping, not exercising/stretching like I should   Currently in Pain? Yes   Pain Score 5    Pain Location Knee            [facility] PT Assessment - 06/16/14 0001    AROM   Left Knee Extension 4   Left Knee Flexion 95                     [facility] Adult PT Treatment/Exercise - 06/16/14 0001    Ambulation/Gait   Gait Comments amb with minimal flexion in left knee   Exercises   Exercises Knee/Hip   Knee/Hip Exercises: Aerobic   Isokinetic NuStep Level 5 x 6 minutes   Knee/Hip Exercises: Machines for Strengthening   Cybex Knee Extension 10# left only 2 sets 10   Cybex Knee Flexion 20# 2x15  left only   Cybex Leg Press 20# 2x15  VCing to work legs together   Knee/Hip Exercises: Plyometrics   Other Plyometric Exercises calf raises on leg press 20# 20 times   Knee/Hip Exercises: Standing   Terminal Knee Extension Strengthening;Left;2 sets;10 reps  with ball on wall   Forward Step Up Left;2 sets;10 reps  6 inch   Manual Therapy   Manual Therapy Joint mobilization;Passive ROM   Joint Mobilization  belt mobs flex and ext   Passive ROM flex/ext                PT Education - 06/16/14 0843    Education provided Yes   Education Details step stretch and calf stretch   Person(s) Educated Patient   Methods Explanation;Demonstration   Comprehension Verbalized understanding;Returned demonstration  PT Short Term Goals - 06/16/14 0845    PT SHORT TERM GOAL #1   Title independent with initial HEP   Status Achieved           PT Long Term Goals - 06/09/14 0929    PT LONG TERM GOAL #1   Title increase AROM of the left knee to 4-108 degrees flexion   Time 8   Period Weeks   Status New   PT LONG TERM GOAL #2   Title decrease pain at night 25%   Time 8   Period Weeks   Status New   PT LONG TERM GOAL #3   Title walk with minimal deviation   Time 8   Period Weeks   Status New   PT LONG TERM GOAL #4   Title report no pain during the day   Time 8   Period Weeks   Status New               Plan - 06/16/14 0844    Clinical Impression Statement pt required [place] with all exercise for correct tech. improved ROM after MT, very quarded with flexion. Stressed importanc eof HEP and reviewed stretches        Problem List Patient Active Problem List   Diagnosis  Date Noted  . Neuropathy 03/24/2014  . Degenerative arthritis of knee 03/17/2014    Temeka Pore,ANGIE PTA 06/16/2014, 8:45 AM  [address] [place], [place], [address] Phone: [phone]   Fax:  [phone]

## 2014-06-19 ENCOUNTER — Ambulatory Visit: Payer: Medicare Other | Admitting: Physical Therapy

## 2014-06-19 ENCOUNTER — Encounter: Payer: Self-pay | Admitting: Physical Therapy

## 2014-06-19 DIAGNOSIS — M25662 Stiffness of left knee, not elsewhere classified: Secondary | ICD-10-CM

## 2014-06-19 DIAGNOSIS — M25562 Pain in left knee: Secondary | ICD-10-CM

## 2014-06-19 NOTE — Therapy (Signed)
[place], [place], [address] Phone: [phone]   Fax:  [phone]  Physical Therapy Treatment  Patient Details  Name: Darrin Koman MRN: [id number] Date of Birth: 05-18-1965 Referring Provider:  Meredith Pel, MD  Encounter Date: 06/19/2014      PT End of Session - 06/19/14 0844    Visit Number 3   Date for PT Re-Evaluation 08/09/14   PT Start Time 0807   PT Stop Time 0850   PT Time Calculation (min) 43 min      Past Medical History  Diagnosis Date  . Coronary artery disease   . Myocardial infarction   . Arthritis   . 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 Stroke     denies  . Diabetes mellitus without complication     not on meds cks blood sugar weekly    Past Surgical History  Procedure Laterality Date  . Knee arthroscopy Left 1984  . Neck surgery  10/14  . 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 Cardiac catheterization  11/14    stent placement  . Exam under anesthesia with manipulation of knee Left 04/21/2014    Procedure: EXAM UNDER ANESTHESIA WITH MANIPULATION OF KNEE;  Surgeon: Meredith Pel, MD;  Location: [facility];  Service: Orthopedics;  Laterality: Left;  LEFT KNEE MANIPULATION UNDER ANESTHESIA    There were no vitals filed for this visit.  Visit Diagnosis:  Knee stiffness, left  Left knee pain      Subjective Assessment - 06/19/14 0844    Patient is accompained by: Interpreter                         [facility] Adult PT Treatment/Exercise - 06/19/14 0001    Knee/Hip Exercises: Aerobic   Stationary Bike 6 min  unable to make full revolutions   Isokinetic NuStep Level 5 x 6 minutes   Knee/Hip Exercises: Machines for Strengthening   Cybex Knee Extension 10# left  only 3 sets 10   Cybex Knee Flexion 20# 2x15  left only   Cybex Leg Press 20# 3 sets 10  left only   Knee/Hip Exercises: Plyometrics   Other Plyometric Exercises standing pulleys press down with left 2 sets 10 40#   Knee/Hip Exercises: Standing   Forward Step Up Left;2 sets;10 reps  up,over,reverse   Knee/Hip Exercises: Seated   Other Seated Knee Exercises stool scoots 1 lap for flexion   Manual Therapy   Manual Therapy Joint mobilization;Passive ROM  belt mobs for flexion   Manual therapy comments kinesiotape ,lantern for pain and inflammation                PT Education - 06/19/14 0844    Education provided Yes   Education Details benefits of kinesiotape and when to remove   Person(s) Educated Patient   Methods Explanation;Demonstration   Comprehension Verbalized understanding          PT Short Term Goals - 06/16/14 0845    PT SHORT TERM GOAL #1   Title independent with initial HEP   Status Achieved           PT Long Term Goals - 06/19/14 0846    PT LONG  TERM GOAL #1   Title increase AROM of the left knee to 4-108 degrees flexion   Status On-going   PT LONG TERM GOAL #2   Title decrease pain at night 25%   Status On-going   PT LONG TERM GOAL #3   Title walk with minimal deviation   Status On-going   PT LONG TERM GOAL #4   Title report no pain during the day   Status On-going               Plan - 06/19/14 0845    Clinical Impression Statement pt fatiues easily with ther ex and needs cuing to do ther ex slowly to get improved ROm and muscle contraction.. Pt very pain focused and feels it is getting no better, looses up the tightens up.   PT Next Visit Plan assess kinesiotape, possibly try modalities        Problem List Patient Active Problem List   Diagnosis Date Noted  . Neuropathy 03/24/2014  . Degenerative arthritis of knee 03/17/2014    Yukie Bergeron,ANGIE PTA 06/19/2014, 8:48 AM  [place] [address] [place] [place], [place], [address] Phone: [phone]   Fax:  [phone]

## 2014-06-23 ENCOUNTER — Encounter: Payer: Self-pay | Admitting: Physical Therapy

## 2014-06-23 ENCOUNTER — Ambulatory Visit: Payer: Medicare Other | Admitting: Physical Therapy

## 2014-06-23 DIAGNOSIS — M25562 Pain in left knee: Secondary | ICD-10-CM | POA: Diagnosis not present

## 2014-06-23 DIAGNOSIS — M25662 Stiffness of left knee, not elsewhere classified: Secondary | ICD-10-CM

## 2014-06-23 NOTE — Therapy (Signed)
[place], [place], [address] Phone: [phone]   Fax:  [phone]  Physical Therapy Treatment  Patient Details  Name: Alan Mendoza MRN: [id number] Date of Birth: 1965-05-08 Referring Provider:  Meredith Pel, MD  Encounter Date: 06/23/2014      PT End of Session - 06/23/14 0854    Visit Number 4   PT Start Time 0800   PT Stop Time 0851   PT Time Calculation (min) 51 min      Past Medical History  Diagnosis Date  . Coronary artery disease   . Myocardial infarction   . Arthritis   . 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 Stroke     denies  . Diabetes mellitus without complication     not on meds cks blood sugar weekly    Past Surgical History  Procedure Laterality Date  . Knee arthroscopy Left 1984  . Neck surgery  10/14  . 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 Cardiac catheterization  11/14    stent placement  . Exam under anesthesia with manipulation of knee Left 04/21/2014    Procedure: EXAM UNDER ANESTHESIA WITH MANIPULATION OF KNEE;  Surgeon: Meredith Pel, MD;  Location: [facility];  Service: Orthopedics;  Laterality: Left;  LEFT KNEE MANIPULATION UNDER ANESTHESIA    There were no vitals filed for this visit.  Visit Diagnosis:  Knee stiffness, left  Left knee pain      Subjective Assessment - 06/23/14 0801    Subjective Pt reports no pain today but he is tossing and turning at night. Pt reports that when he is walking and sitting he is fine but when he lays down he has pain in his knee and lateral aspect of shin.    Patient is accompained by: Interpreter   Currently in Pain? No/denies  Pt reports pain when lying down at night.   Pain Score 0-No pain   Pain Location Knee   Pain  Orientation Left                         [facility] Adult PT Treatment/Exercise - 06/23/14 0001    Knee/Hip Exercises: Aerobic   Stationary Bike 6 min   Elliptical unable to make full revolutions    Knee/Hip Exercises: Machines for Strengthening   Cybex Knee Extension 35# 2 sets 15   Cybex Knee Flexion 45# 2x15   Cybex Leg Press 20# 3 sets 10   Knee/Hip Exercises: Standing   Lateral Step Up Left;2 sets;10 reps  8 inch box   Forward Step Up Left;2 sets;15 reps  On Bosu   Functional Squat 2 sets;10 reps  Green physo ball   Manual Therapy   Manual Therapy Joint mobilization;Passive ROM   Joint Mobilization  belt mobs ext   Passive ROM flex/ext                  PT Short Term Goals - 06/16/14 0845    PT SHORT TERM GOAL #1   Title independent with initial HEP   Status Achieved           PT Long Term Goals - 06/23/14 0857    PT LONG TERM GOAL #1  Title increase AROM of the left knee to 4-108 degrees flexion   Status On-going   PT LONG TERM GOAL #2   Title decrease pain at night 25%   Status On-going   PT LONG TERM GOAL #3   Title walk with minimal deviation   Status On-going               Plan - 06/23/14 0854    Clinical Impression Statement Pt continues to fatigue with exercises and requires cues to slow down when performing exercises. Pt reports that he has R leg weakness and he tends to stumble all the time, but reports no falls   Pt will benefit from skilled therapeutic intervention in order to improve on the following deficits Abnormal gait;Decreased mobility;Decreased range of motion;Difficulty walking;Impaired flexibility;Pain   Rehab Potential Good   PT Frequency 2x / week   PT Duration 8 weeks   PT Treatment/Interventions Functional mobility training;Therapeutic activities;Therapeutic exercise;Balance training;Neuromuscular re-education;Patient/family education;Passive range of motion;Manual techniques   PT Next Visit Plan  possibly try modalities   Consulted and Agree with Plan of Care Patient        Problem List Patient Active Problem List   Diagnosis Date Noted  . Neuropathy 03/24/2014  . Degenerative arthritis of knee 03/17/2014    Scot Jun, PTA 06/23/2014, 9:02 AM  [place], [place], [address] Phone: [phone]   Fax:  [phone]

## 2014-06-26 ENCOUNTER — Encounter: Payer: Self-pay | Admitting: Physical Therapy

## 2014-06-26 ENCOUNTER — Ambulatory Visit: Payer: Medicare Other | Admitting: Physical Therapy

## 2014-06-26 DIAGNOSIS — M25562 Pain in left knee: Secondary | ICD-10-CM

## 2014-06-26 DIAGNOSIS — M25662 Stiffness of left knee, not elsewhere classified: Secondary | ICD-10-CM | POA: Diagnosis not present

## 2014-06-26 NOTE — Therapy (Signed)
[place], [place], [address] Phone: [phone]   Fax:  [phone]  Physical Therapy Treatment  Patient Details  Name: Alan Mendoza MRN: [id number] Date of Birth: 21-Nov-1965 Referring Provider:  Meredith Pel, MD  Encounter Date: 06/26/2014      PT End of Session - 06/26/14 0902    Visit Number 5   PT Start Time 0802   PT Stop Time 0900   PT Time Calculation (min) 58 min      Past Medical History  Diagnosis Date  . Coronary artery disease   . Myocardial infarction   . Arthritis   . 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 Stroke     denies  . Diabetes mellitus without complication     not on meds cks blood sugar weekly    Past Surgical History  Procedure Laterality Date  . Knee arthroscopy Left 1984  . Neck surgery  10/14  . 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 Cardiac catheterization  11/14    stent placement  . Exam under anesthesia with manipulation of knee Left 04/21/2014    Procedure: EXAM UNDER ANESTHESIA WITH MANIPULATION OF KNEE;  Surgeon: Meredith Pel, MD;  Location: [facility];  Service: Orthopedics;  Laterality: Left;  LEFT KNEE MANIPULATION UNDER ANESTHESIA    There were no vitals filed for this visit.  Visit Diagnosis:  Knee stiffness, left  Left knee pain      Subjective Assessment - 06/26/14 0801    Subjective Pt reports that he is fine,but is right knee feels weak. Pt reports pinched nerve in RLE. Pt reports that the last two nights he has been sleeping better. He feels like his knee is getting better   Patient is accompained by: Interpreter   Limitations Standing;Walking;House hold activities   Patient Stated Goals less pain and sleep better                          [facility] Adult PT Treatment/Exercise - 06/26/14 0001    Knee/Hip Exercises: Aerobic   Stationary Bike 8 min   Elliptical able to make full revolution with R lateral lean    Knee/Hip Exercises: Machines for Strengthening   Cybex Knee Extension 45# 15 reps, #15 15 reps RLE    Cybex Knee Flexion 55# 2x15   Cybex Leg Press 60# 2 sets 10, #80 15 reps    Knee/Hip Exercises: Standing   Functional Squat 2 sets;15 reps  Physo ball    Manual Therapy   Manual Therapy Joint mobilization;Passive ROM   Joint Mobilization  belt mobs ext   Passive ROM flex/ext                  PT Short Term Goals - 06/16/14 0845    PT SHORT TERM GOAL #1   Title independent with initial HEP   Status Achieved           PT Long Term Goals - 06/23/14 0857    PT LONG TERM GOAL #1   Title increase AROM of the left knee to 4-108 degrees flexion   Status On-going   PT LONG TERM GOAL #2   Title decrease pain at night 25%  Status On-going   PT LONG TERM GOAL #3   Title walk with minimal deviation   Status On-going               Plan - 06/26/14 0903    Clinical Impression Statement Pt demos a decreased activity tolerance often shows signs of fatigue with exercise. Pt continues to report R LE weakness. Pt reports that he is sleeping better at night and he thinks his knee is getting better. Pt ROM still remains limites displaying a firm end  point around 90 degrees of L knee flexion    Pt will benefit from skilled therapeutic intervention in order to improve on the following deficits Abnormal gait;Decreased mobility;Decreased range of motion;Difficulty walking;Impaired flexibility;Pain   Rehab Potential Good   PT Frequency 2x / week   PT Duration 8 weeks   PT Treatment/Interventions Functional mobility training;Therapeutic activities;Therapeutic exercise;Balance training;Neuromuscular re-education;Patient/family education;Passive range of motion;Manual  techniques   PT Next Visit Plan Continue progression with exercise         Problem List Patient Active Problem List   Diagnosis Date Noted  . Neuropathy 03/24/2014  . Degenerative arthritis of knee 03/17/2014    Scot Jun, PTA 06/26/2014, 9:09 AM  [place], [place], [address] Phone: [phone]   Fax:  [phone]

## 2014-06-29 ENCOUNTER — Ambulatory Visit: Payer: Medicare Other | Admitting: Physical Therapy

## 2014-07-03 ENCOUNTER — Ambulatory Visit: Payer: Medicare Other | Attending: Orthopedic Surgery | Admitting: Physical Therapy

## 2014-07-03 ENCOUNTER — Encounter: Payer: Self-pay | Admitting: Physical Therapy

## 2014-07-03 DIAGNOSIS — M25562 Pain in left knee: Secondary | ICD-10-CM | POA: Insufficient documentation

## 2014-07-03 DIAGNOSIS — M25662 Stiffness of left knee, not elsewhere classified: Secondary | ICD-10-CM | POA: Diagnosis not present

## 2014-07-03 NOTE — Therapy (Signed)
[place], [place], [address] Phone: [phone]   Fax:  [phone]  Physical Therapy Treatment  Patient Details  Name: Alan Mendoza MRN: [id number] Date of Birth: [birth year] Referring Provider:  Meredith Pel, MD  Encounter Date: 07/03/2014      PT End of Session - 07/03/14 0840    Visit Number 6   PT Start Time 0800   PT Stop Time 0841   PT Time Calculation (min) 41 min      Past Medical History  Diagnosis Date  . Coronary artery disease   . Myocardial infarction   . Arthritis   . 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 Stroke     denies  . Diabetes mellitus without complication     not on meds cks blood sugar weekly    Past Surgical History  Procedure Laterality Date  . Knee arthroscopy Left 1984  . Neck surgery  10/14  . 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 Cardiac catheterization  11/14    stent placement  . Exam under anesthesia with manipulation of knee Left 04/21/2014    Procedure: EXAM UNDER ANESTHESIA WITH MANIPULATION OF KNEE;  Surgeon: Meredith Pel, MD;  Location: [facility];  Service: Orthopedics;  Laterality: Left;  LEFT KNEE MANIPULATION UNDER ANESTHESIA    There were no vitals filed for this visit.  Visit Diagnosis:  Knee stiffness, left  Left knee pain      Subjective Assessment - 07/03/14 0801    Subjective Pt reports no change in L knee, but he still is having trouble sleeping. Pt reports his knee is stiff when he wakes up    Patient is accompained by: Interpreter   Limitations Standing;Walking;House hold activities   Patient Stated Goals less pain and sleep better   Pain Score 6    Pain Location Knee   Pain Orientation Left            [facility] PT Assessment -  07/03/14 0001    AROM   Left Knee Extension 4   Left Knee Flexion 95   Palpation   Patella mobility good   Palpation comment non tender, good scar movility                     [facility] Adult PT Treatment/Exercise - 07/03/14 0001    Knee/Hip Exercises: Aerobic   Stationary Bike 8 min   Elliptical able to make full revolution with R lateral lean    Knee/Hip Exercises: Machines for Strengthening   Cybex Knee Extension 15# 20 reps RLE   Cybex Knee Flexion 45# 2x15   Cybex Leg Press 60# 3 sets 10,    Knee/Hip Exercises: Standing   Functional Squat 2 sets;15 reps  green physo ball   Manual Therapy   Manual Therapy Soft tissue mobilization;Joint mobilization   Manual therapy comments patella joint mobs grade 2-3                  PT Short Term Goals - 06/16/14 0845    PT SHORT TERM GOAL #1   Title independent with initial HEP   Status Achieved           PT Long Term Goals - 07/03/14  0844    PT LONG TERM GOAL #1   Status On-going   PT LONG TERM GOAL #2   Title decrease pain at night 25%   Status On-going   PT LONG TERM GOAL #3   Title walk with minimal deviation   Status On-going   PT LONG TERM GOAL #4   Title report no pain during the day   Status On-going               Plan - 07/03/14 0841    Clinical Impression Statement Pt still shows signs of fatigue with exercise. Pt reports difficulty sleeping causing to have decrease energy. Pt L knee AROM has remain the same for lass assessment. Pt still walks with a antalgic gait. Pt MD office notified about pt sleeping problems due to pain. Recommend continued PT services to address deficits    Pt will benefit from skilled therapeutic intervention in order to improve on the following deficits Abnormal gait;Decreased mobility;Decreased range of motion;Difficulty walking;Impaired flexibility;Pain   PT Frequency 2x / week   PT Duration 8 weeks   PT Treatment/Interventions Functional mobility  training;Therapeutic activities;Therapeutic exercise;Balance training;Neuromuscular re-education;Patient/family education;Passive range of motion;Manual techniques   PT Next Visit Plan Continue progression with exercise         Problem List Patient Active Problem List   Diagnosis Date Noted  . Neuropathy 03/24/2014  . Degenerative arthritis of knee 03/17/2014    Scot Jun, PTA 07/03/2014, 8:45 AM  [place] [address] [place], [place], [address] Phone: [phone]   Fax:  [phone]

## 2014-07-07 ENCOUNTER — Ambulatory Visit: Payer: Medicare Other | Admitting: Rehabilitation

## 2014-07-07 DIAGNOSIS — M25662 Stiffness of left knee, not elsewhere classified: Secondary | ICD-10-CM

## 2014-07-07 DIAGNOSIS — M25562 Pain in left knee: Secondary | ICD-10-CM | POA: Diagnosis not present

## 2014-07-07 NOTE — Therapy (Signed)
[place], [place], [address] Phone: [phone]   Fax:  [phone]  Physical Therapy Treatment  Patient Details  Name: Alan Mendoza MRN: [id number] Date of Birth: 01/23/1965 Referring Provider:  Meredith Pel, MD  Encounter Date: 07/07/2014      PT End of Session - 07/07/14 0822    Visit Number 7   PT Start Time 0805   PT Stop Time 0910   PT Time Calculation (min) 65 min      Past Medical History  Diagnosis Date  . Coronary artery disease   . Myocardial infarction   . Arthritis   . 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 Stroke     denies  . Diabetes mellitus without complication     not on meds cks blood sugar weekly    Past Surgical History  Procedure Laterality Date  . Knee arthroscopy Left 1984  . Neck surgery  10/14  . 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 Cardiac catheterization  11/14    stent placement  . Exam under anesthesia with manipulation of knee Left 04/21/2014    Procedure: EXAM UNDER ANESTHESIA WITH MANIPULATION OF KNEE;  Surgeon: Meredith Pel, MD;  Location: [facility];  Service: Orthopedics;  Laterality: Left;  LEFT KNEE MANIPULATION UNDER ANESTHESIA    There were no vitals filed for this visit.  Visit Diagnosis:  Left knee pain  Knee stiffness, left      Subjective Assessment - 07/07/14 0822    Subjective States still has same level of L knee/lateral calf and anterior ankle pain.    Only at night.    Improves when he gets up and walks around a little, but returns after 30' lying down again.    Daytime is ok   Patient is accompained by: Interpreter   Limitations Other (comment)   Currently in Pain? Yes   Pain Score 3    Pain Location Knee   Pain Orientation Left   Pain  Descriptors / Indicators Aching   Aggravating Factors  lying down/sleeping   Pain Relieving Factors range of motion                         [facility] Adult PT Treatment/Exercise - 07/07/14 0001    Knee/Hip Exercises: Aerobic   Stationary Bike 8' seat 5   Isokinetic NuStep x 8' L5   Knee/Hip Exercises: Machines for Strengthening   Cybex Knee Extension 15# RLE x 20   Cybex Knee Flexion 45# x 30   Cybex Leg Press 60# x 30   Knee/Hip Exercises: Supine   Other Supine Knee/Hip Exercises wall slide knee flexion x 1' x 3   Other Supine Knee/Hip Exercises SKTC knee flexion stretch x 1' x 5 with manual OP   Knee/Hip Exercises: Prone   Other Prone Exercises prone knee extension hang x 5' with 4lb cuff wt                PT Education - 07/07/14 0824    Education provided Yes   Education Details compresison hose at night   Person(s) Educated Patient   Methods Explanation   Comprehension Verbalized understanding  PT Short Term Goals - 06/16/14 0845    PT SHORT TERM GOAL #1   Title independent with initial HEP   Status Achieved           PT Long Term Goals - 07/03/14 0844    PT LONG TERM GOAL #1   Status On-going   PT LONG TERM GOAL #2   Title decrease pain at night 25%   Status On-going   PT LONG TERM GOAL #3   Title walk with minimal deviation   Status On-going   PT LONG TERM GOAL #4   Title report no pain during the day   Status On-going               Plan - 07/07/14 3300    Clinical Impression Statement Pt. still with min/mod L knee edema.   States didn't wear compression hose long and did not ice multiple times daily after sx.   The radiating pain in the LLE from knee down anterolateral calf is suspicious for radiculopathy and he has a hx of cspine sx for 1 disc.    He states 2 other discs were bad, but they could not repair them.     L knee ROM today is 99 deg flexion with wall slide stretch so he is improving and progressing with  respect to the knee.          Problem List Patient Active Problem List   Diagnosis Date Noted  . Neuropathy 03/24/2014  . Degenerative arthritis of knee 03/17/2014    Andriana Casa, PT 07/07/2014, 9:26 AM  [place], [place], [address] Phone: [phone]   Fax:  [phone]

## 2014-07-10 ENCOUNTER — Encounter: Payer: Self-pay | Admitting: Physical Therapy

## 2014-07-10 ENCOUNTER — Ambulatory Visit: Payer: Medicare Other | Admitting: Physical Therapy

## 2014-07-10 DIAGNOSIS — M25662 Stiffness of left knee, not elsewhere classified: Secondary | ICD-10-CM | POA: Diagnosis not present

## 2014-07-10 DIAGNOSIS — M25562 Pain in left knee: Secondary | ICD-10-CM

## 2014-07-10 NOTE — Therapy (Signed)
[place], [place], [address] Phone: [phone]   Fax:  [phone]  Physical Therapy Treatment  Patient Details  Name: Alan Mendoza MRN: [id number] Date of Birth: 1965-10-12 Referring Provider:  Meredith Pel, MD  Encounter Date: 07/10/2014      PT End of Session - 07/10/14 0841    Visit Number 8   Date for PT Re-Evaluation 08/09/14   PT Start Time 0800   PT Stop Time 0840   PT Time Calculation (min) 40 min      Past Medical History  Diagnosis Date  . Coronary artery disease   . Myocardial infarction   . Arthritis   . 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 Stroke     denies  . Diabetes mellitus without complication     not on meds cks blood sugar weekly    Past Surgical History  Procedure Laterality Date  . Knee arthroscopy Left 1984  . Neck surgery  10/14  . 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 Cardiac catheterization  11/14    stent placement  . Exam under anesthesia with manipulation of knee Left 04/21/2014    Procedure: EXAM UNDER ANESTHESIA WITH MANIPULATION OF KNEE;  Surgeon: Meredith Pel, MD;  Location: [facility];  Service: Orthopedics;  Laterality: Left;  LEFT KNEE MANIPULATION UNDER ANESTHESIA    There were no vitals filed for this visit.  Visit Diagnosis:  Left knee pain  Knee stiffness, left      Subjective Assessment - 07/10/14 0841    Patient is accompained by: Interpreter                         [facility] Adult PT Treatment/Exercise - 07/10/14 0001    Knee/Hip Exercises: Aerobic   Elliptical 5 min I 8 R 4   Isokinetic NuStep x 8' L5   Knee/Hip Exercises: Machines for Strengthening   Cybex Knee Extension 15# RLE 2 sets 15   Cybex Knee Flexion 25# Rt only 2  sets 15   Cybex Leg Press 60# 2 sets 15   Knee/Hip Exercises: Standing   Lateral Step Up Left;1 set;15 reps  8 inch   Forward Step Up Left;15 reps  8 inch   Wall Squat 10 reps;5 seconds   Other Standing Knee Exercises box flexion stretch 5 times 10 sec   Knee/Hip Exercises: Seated   Other Seated Knee/Hip Exercises stool scoots 1 lap for flexion                PT Education - 07/10/14 0840    Education provided Yes   Education Details step stretch and wall slides for flexion   Person(s) Educated Patient   Methods Explanation;Demonstration   Comprehension Verbalized understanding;Returned demonstration          PT Short Term Goals - 06/16/14 0845    PT SHORT TERM GOAL #1   Title independent with initial HEP   Status Achieved           PT Long Term Goals - 07/10/14 0815    PT LONG TERM GOAL #1   Title increase AROM of the left knee to 4-108 degrees flexion   Baseline AROM 07/10/14  0-100  Status Partially Met   PT LONG TERM GOAL #2   Title decrease pain at night 25%   Status On-going   PT LONG TERM GOAL #3   Title walk with minimal deviation   Status Achieved   PT LONG TERM GOAL #4   Title report no pain during the day   Status On-going               Plan - 07/10/14 0841    Clinical Impression Statement improved ext and good quad contraction, flexion slowly improving   PT Next Visit Plan Pt missed 7/6 MD appt,rescheduled 7/20        Problem List Patient Active Problem List   Diagnosis Date Noted  . Neuropathy 03/24/2014  . Degenerative arthritis of knee 03/17/2014    Migel Hannis,ANGIE PTA 07/10/2014, 8:45 AM  [place] [address] [place], [place], [address] Phone: [phone]   Fax:  [phone]

## 2014-07-14 ENCOUNTER — Ambulatory Visit: Payer: Medicare Other | Admitting: Physical Therapy

## 2014-07-14 DIAGNOSIS — M25662 Stiffness of left knee, not elsewhere classified: Secondary | ICD-10-CM

## 2014-07-14 DIAGNOSIS — M25562 Pain in left knee: Secondary | ICD-10-CM | POA: Diagnosis not present

## 2014-07-14 NOTE — Therapy (Signed)
[place], [place], [address] Phone: [phone]   Fax:  [phone]  Physical Therapy Treatment  Patient Details  Name: Alan Mendoza MRN: [id number] Date of Birth: 22-Dec-1965 Referring Provider:  Meredith Pel, MD  Encounter Date: 07/14/2014      PT End of Session - 07/14/14 0843    Visit Number 9   Date for PT Re-Evaluation 08/09/14   PT Start Time 0801   PT Stop Time 0843   PT Time Calculation (min) 42 min   Activity Tolerance Patient tolerated treatment well   Behavior During Therapy [facility] for tasks assessed/performed      Past Medical History  Diagnosis Date  . Coronary artery disease   . Myocardial infarction   . Arthritis   . 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 Stroke     denies  . Diabetes mellitus without complication     not on meds cks blood sugar weekly    Past Surgical History  Procedure Laterality Date  . Knee arthroscopy Left 1984  . Neck surgery  10/14  . Total knee arthroplasty Left 03/17/2014    Procedure: TOTAL KNEE ARTHROPLASTY;  Surgeon: Meredith Pel, MD;  Location: [place];  Service: Orthopedics;  Laterality: Left;  . Cardiac catheterization  11/14    stent placement  . Exam under anesthesia with manipulation of knee Left 04/21/2014    Procedure: EXAM UNDER ANESTHESIA WITH MANIPULATION OF KNEE;  Surgeon: Meredith Pel, MD;  Location: [place];  Service: Orthopedics;  Laterality: Left;  LEFT KNEE MANIPULATION UNDER ANESTHESIA    There were no vitals filed for this visit.  Visit Diagnosis:  Left knee pain  Knee stiffness, left      Subjective Assessment - 07/14/14 0804    Subjective Pt states he didn't sleep well last night due to leg pain, he is feeling ok. He has been doing his HEP   Currently in Pain? Yes   Pain Score 5    Pain Location Knee   Pain Orientation Left   Pain Onset 1 to 4 weeks ago                          [facility] Adult PT Treatment/Exercise - 07/14/14 0001    Knee/Hip Exercises: Aerobic   Stationary Bike 8' seat 5   Elliptical 5 min I 8 R 4   Knee/Hip Exercises: Machines for Strengthening   Cybex Knee Extension 15# RLE 2 sets 15   Cybex Knee Flexion 25# Rt only 2 sets 15   Cybex Leg Press 70# 2 sets 15-B, 1 set 15 40#, SL (left)   Knee/Hip Exercises: Standing   Lateral Step Up Left;1 set;15 reps   Forward Step Up Left;15 reps   Wall Squat 10 reps;5 seconds  20 sec hold with manual OP.     Other Standing Knee Exercises table knee flexion, 5 reps, 20 sec hold with manual OP   Knee/Hip Exercises: Seated   Other Seated Knee/Hip Exercises stool scoots 2 laps for flexion                  PT Short Term Goals - 06/16/14 0845    PT SHORT TERM GOAL #1   Title independent with initial HEP   Status Achieved  PT Long Term Goals - 07/10/14 0815    PT LONG TERM GOAL #1   Title increase AROM of the left knee to 4-108 degrees flexion   Baseline AROM 07/10/14  0-100   Status Partially Met   PT LONG TERM GOAL #2   Title decrease pain at night 25%   Status On-going   PT LONG TERM GOAL #3   Title walk with minimal deviation   Status Achieved   PT LONG TERM GOAL #4   Title report no pain during the day   Status On-going               Plan - 07/14/14 0844    Clinical Impression Statement Pt still guarded during flexion exercises but allows therapist OP to increase motion during active exercises.    Rehab Potential Good   PT Frequency 2x / week   PT Duration 8 weeks   PT Treatment/Interventions Functional mobility training;Therapeutic activities;Therapeutic exercise;Balance training;Neuromuscular re-education;Patient/family education;Passive range of motion;Manual techniques   PT Next Visit Plan Remind pt of MD appt on 7/20, continue quad strengthening and flexion exercises   Consulted and Agree with Plan of Care Patient         Problem List Patient Active Problem List   Diagnosis Date Noted  . Neuropathy 03/24/2014  . Degenerative arthritis of knee 03/17/2014    PAYSEUR,ANGIE PTA 07/14/2014, 8:50 AM  [place] [address] [place], [place], [address] Phone: [phone]   Fax:  [phone]

## 2014-07-17 ENCOUNTER — Ambulatory Visit: Payer: Medicare Other | Admitting: Physical Therapy

## 2014-07-17 ENCOUNTER — Encounter: Payer: Self-pay | Admitting: Physical Therapy

## 2014-07-17 DIAGNOSIS — M25662 Stiffness of left knee, not elsewhere classified: Secondary | ICD-10-CM | POA: Diagnosis not present

## 2014-07-17 DIAGNOSIS — M25562 Pain in left knee: Secondary | ICD-10-CM

## 2014-07-17 NOTE — Therapy (Signed)
[place], [place], [address] Phone: [phone]   Fax:  [phone]  Physical Therapy Treatment  Patient Details  Name: Alan Mendoza MRN: [id number] Date of Birth: [birth year] Referring Provider:  Meredith Pel, MD  Encounter Date: 07/17/2014      PT End of Session - 07/17/14 0842    Visit Number 10   Date for PT Re-Evaluation 08/09/14   PT Start Time 0801   PT Stop Time 0841   PT Time Calculation (min) 40 min   Activity Tolerance Patient tolerated treatment well   Behavior During Therapy [facility] for tasks assessed/performed      Past Medical History  Diagnosis Date  . Coronary artery disease   . Myocardial infarction   . Arthritis   . 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 Stroke     denies  . Diabetes mellitus without complication     not on meds cks blood sugar weekly    Past Surgical History  Procedure Laterality Date  . Knee arthroscopy Left 1984  . Neck surgery  10/14  . Total knee arthroplasty Left 03/17/2014    Procedure: TOTAL KNEE ARTHROPLASTY;  Surgeon: Meredith Pel, MD;  Location: [place];  Service: Orthopedics;  Laterality: Left;  . Cardiac catheterization  11/14    stent placement  . Exam under anesthesia with manipulation of knee Left 04/21/2014    Procedure: EXAM UNDER ANESTHESIA WITH MANIPULATION OF KNEE;  Surgeon: Meredith Pel, MD;  Location: [facility];  Service: Orthopedics;  Laterality: Left;  LEFT KNEE MANIPULATION UNDER ANESTHESIA    There were no vitals filed for this visit.  Visit Diagnosis:  Left knee pain  Knee stiffness, left      Subjective Assessment - 07/17/14 0801    Subjective Pt continues to report that he has trouble sleeping due to L leg pain.    Patient is accompained by: Interpreter   Limitations Other (comment)   Currently in Pain? Yes   Pain Score 5    Pain Location Knee   Pain  Orientation Left                         [facility] Adult PT Treatment/Exercise - 07/17/14 0001    Knee/Hip Exercises: Aerobic   Stationary Bike 8' seat 5   Elliptical 5 min L6 R 6   Knee/Hip Exercises: Machines for Strengthening   Cybex Knee Extension 20# LLE 2 sets 15   Cybex Knee Flexion 20# L only 2 sets 15 reps   Knee/Hip Exercises: Standing   Gait Training Resisted gait forwards/backwards with backwards focusing on eccentric resistance   Other Standing Knee Exercises stairs x 1 up/down, step downs 1 x 10 reps                  PT Short Term Goals - 06/16/14 0845    PT SHORT TERM GOAL #1   Title independent with initial HEP   Status Achieved           PT Long Term Goals - 07/10/14 0815    PT LONG TERM GOAL #1   Title increase AROM of the left knee to 4-108 degrees flexion   Baseline AROM 07/10/14  0-100   Status Partially Met   PT LONG TERM GOAL #2   Title decrease pain at night 25%  Status On-going   PT LONG TERM GOAL #3   Title walk with minimal deviation   Status Achieved   PT LONG TERM GOAL #4   Title report no pain during the day   Status On-going               Plan - 07/17/14 0938    Clinical Impression Statement Pt did well with seated belt joint mobes.  allowed increase motion past approximately 100 degrees.  Pt did well with resisted gait but needed cues with arm swing and knee bend.     Pt will benefit from skilled therapeutic intervention in order to improve on the following deficits Abnormal gait;Decreased mobility;Decreased range of motion;Difficulty walking;Impaired flexibility;Pain   Rehab Potential Good   PT Frequency 2x / week   PT Duration 8 weeks   PT Treatment/Interventions Functional mobility training;Therapeutic activities;Therapeutic exercise;Balance training;Neuromuscular re-education;Patient/family education;Passive range of motion;Manual techniques   PT Next Visit Plan Remind pt of MD appt on 7/20, continue  quad strengthening and flexion exercises, resisted gait, stairs, add in balance and proprioception   Consulted and Agree with Plan of Care Patient        Problem List Patient Active Problem List   Diagnosis Date Noted  . Neuropathy 03/24/2014  . Degenerative arthritis of knee 03/17/2014    Scot Jun, PTA 07/17/2014, 8:48 AM  [place], [place], [address] Phone: [phone]   Fax:  [phone]

## 2014-07-21 ENCOUNTER — Ambulatory Visit: Payer: Medicare Other | Admitting: Physical Therapy

## 2014-07-21 DIAGNOSIS — M25562 Pain in left knee: Secondary | ICD-10-CM | POA: Diagnosis not present

## 2014-07-21 DIAGNOSIS — M25662 Stiffness of left knee, not elsewhere classified: Secondary | ICD-10-CM | POA: Diagnosis not present

## 2014-07-21 NOTE — Therapy (Signed)
[place] [address] [place], [place], [address] Phone: [phone]   Fax:  [phone]  Physical Therapy Treatment  Patient Details  Name: Alan Mendoza MRN: [id number] Date of Birth: [birth year] Referring Provider:  Meredith Pel, MD  Encounter Date: 07/21/2014      PT End of Session - 07/21/14 0813    Visit Number 11   Date for PT Re-Evaluation 08/09/14   PT Start Time 0801   PT Stop Time 0841   PT Time Calculation (min) 40 min   Activity Tolerance Patient tolerated treatment well   Behavior During Therapy [facility] for tasks assessed/performed      Past Medical History  Diagnosis Date  . Coronary artery disease   . Myocardial infarction   . Arthritis   . 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 Stroke     denies  . Diabetes mellitus without complication     not on meds cks blood sugar weekly    Past Surgical History  Procedure Laterality Date  . Knee arthroscopy Left 1984  . Neck surgery  10/14  . Total knee arthroplasty Left 03/17/2014    Procedure: TOTAL KNEE ARTHROPLASTY;  Surgeon: Meredith Pel, MD;  Location: [facility];  Service: Orthopedics;  Laterality: Left;  . Cardiac catheterization  11/14    stent placement  . Exam under anesthesia with manipulation of knee Left 04/21/2014    Procedure: EXAM UNDER ANESTHESIA WITH MANIPULATION OF KNEE;  Surgeon: Meredith Pel, MD;  Location: [place];  Service: Orthopedics;  Laterality: Left;  LEFT KNEE MANIPULATION UNDER ANESTHESIA    There were no vitals filed for this visit.  Visit Diagnosis:  Left knee pain - Plan: PT plan of care cert/re-cert  Knee stiffness, left - Plan: PT plan of care cert/re-cert      Subjective Assessment - 07/21/14 0801    Subjective Pt states that things are the same with his pain level and not being able to sleep.  States he has a Dr. appt tomorrow   Patient is accompained by:  Interpreter   Currently in Pain? Yes   Pain Score 5    Pain Location Knee   Pain Orientation Left   Pain Descriptors / Indicators Aching   Pain Type Surgical pain   Pain Onset More than a month ago   Pain Frequency Constant            [facility] PT Assessment - 07/21/14 0001    AROM   Left Knee Extension 2   Left Knee Flexion 96                     [facility] Adult PT Treatment/Exercise - 07/21/14 0001    Knee/Hip Exercises: Aerobic   Stationary Bike 8' seat 5   Elliptical 5 min L8 R 6   Knee/Hip Exercises: Machines for Strengthening   Cybex Knee Extension 25# LLE 2 sets 10 reps   Cybex Knee Flexion 20# L only 2 sets 15 reps   Cybex Leg Press 80# 2 sets 10-B, 2 sets 10 50#, SL (left)   Manual Therapy   Manual Therapy Joint mobilization   Joint Mobilization  belt mobs ext, flex                PT Education - 07/21/14 0841    Education provided Yes   Education Details wall slides  and continuing HEP    Person(s) Educated Patient   Methods Explanation   Comprehension Verbalized understanding          PT Short Term Goals - 06/16/14 0845    PT SHORT TERM GOAL #1   Title independent with initial HEP   Status Achieved           PT Long Term Goals - 07/10/14 0815    PT LONG TERM GOAL #1   Title increase AROM of the left knee to 4-108 degrees flexion   Baseline AROM 07/10/14  0-100   Status Partially Met   PT LONG TERM GOAL #2   Title decrease pain at night 25%   Status On-going   PT LONG TERM GOAL #3   Title walk with minimal deviation   Status Achieved   PT LONG TERM GOAL #4   Title report no pain during the day   Status On-going               Plan - 08-07-14 0843    Clinical Impression Statement Pt did well with joint mobes seated as well as in the leg press.  Took measurements today and pt continues to be around 95-96 degrees despite forcefull joint mobes and prolonged stretching at end range.  Will progress per Dr. visit on 07/22/14    Pt will benefit from skilled therapeutic intervention in order to improve on the following deficits Abnormal gait;Decreased mobility;Decreased range of motion;Difficulty walking;Impaired flexibility;Pain   Rehab Potential Fair   PT Frequency 2x / week   PT Duration 8 weeks   PT Next Visit Plan Continue to work on sustained flexion in leg press, resisted gait, stairs   PT Home Exercise Plan prolonged stretching   Consulted and Agree with Plan of Care Patient          G-Codes - 2014-08-07 1049    Functional Assessment Tool Used FOTO   Functional Limitation Mobility: Walking and moving around   Mobility: Walking and Moving Around Current Status [phone]) At least 40 percent but less than 60 percent impaired, limited or restricted   Mobility: Walking and Moving Around Goal Status [phone]) At least 20 percent but less than 40 percent impaired, limited or restricted      Problem List Patient Active Problem List   Diagnosis Date Noted  . Neuropathy 03/24/2014  . Degenerative arthritis of knee 03/17/2014    Sumner Boast., PT August 07, 2014, 10:57 AM  [place] [address] [place], [place], [address] Phone: [phone]   Fax:  [phone]

## 2014-07-22 DIAGNOSIS — M1712 Unilateral primary osteoarthritis, left knee: Secondary | ICD-10-CM | POA: Diagnosis not present

## 2014-07-24 ENCOUNTER — Ambulatory Visit: Payer: Medicare Other | Admitting: Physical Therapy

## 2014-07-28 ENCOUNTER — Ambulatory Visit: Payer: Medicare Other | Admitting: Physical Therapy

## 2014-10-07 ENCOUNTER — Ambulatory Visit (INDEPENDENT_AMBULATORY_CARE_PROVIDER_SITE_OTHER): Payer: Medicare Other | Admitting: Adult Health

## 2014-10-07 ENCOUNTER — Encounter: Payer: Self-pay | Admitting: Adult Health

## 2014-10-07 VITALS — BP 120/72 | Temp 98.4°F | Ht 67.0 in | Wt 154.2 lb

## 2014-10-07 DIAGNOSIS — N529 Male erectile dysfunction, unspecified: Secondary | ICD-10-CM

## 2014-10-07 DIAGNOSIS — H9193 Unspecified hearing loss, bilateral: Secondary | ICD-10-CM

## 2014-10-07 DIAGNOSIS — Z7189 Other specified counseling: Secondary | ICD-10-CM

## 2014-10-07 DIAGNOSIS — Z23 Encounter for immunization: Secondary | ICD-10-CM

## 2014-10-07 DIAGNOSIS — Z7689 Persons encountering health services in other specified circumstances: Secondary | ICD-10-CM

## 2014-10-07 DIAGNOSIS — B0689 Other rubella complications: Secondary | ICD-10-CM

## 2014-10-07 DIAGNOSIS — F17200 Nicotine dependence, unspecified, uncomplicated: Secondary | ICD-10-CM | POA: Diagnosis not present

## 2014-10-07 MED ORDER — BUPROPION HCL ER (SR) 150 MG PO TB12: 150.0000 mg | ORAL_TABLET | Freq: Two times a day (BID) | ORAL | Status: DC

## 2014-10-07 NOTE — Patient Instructions (Addendum)
  It was great meeting you today!  Work on quitting smoking   Wellbutrin   Take one pill for the next three days. After that take one pill in the morning and one at night.   Continue to eat healthy and exercise - this will also help with the smoking cessation.   If you need anything, please let me know.   Follow up for a complete physical.    You Can Quit Smoking If you are ready to quit smoking or are thinking about it, congratulations! You have chosen to help yourself be healthier and live longer! There are lots of different ways to quit smoking. Nicotine gum, nicotine patches, a nicotine inhaler, or nicotine nasal spray can help with physical craving. Hypnosis, support groups, and medicines help break the habit of smoking. TIPS TO GET OFF AND STAY OFF CIGARETTES  Learn to predict your moods. Do not let a bad situation be your excuse to have a cigarette. Some situations in your life might tempt you to have a cigarette.  Ask friends and co-workers not to smoke around you.  Make your home smoke-free.  Never have "just one" cigarette. It leads to wanting another and another. Remind yourself of your decision to quit.  On a card, make a list of your reasons for not smoking. Read it at least the same number of times a day as you have a cigarette. Tell yourself everyday, "I do not want to smoke. I choose not to smoke."  Ask someone at home or work to help you with your plan to quit smoking.  Have something planned after you eat or have a cup of coffee. Take a walk or get other exercise to perk you up. This will help to keep you from overeating.  Try a relaxation exercise to calm you down and decrease your stress. Remember, you may be tense and nervous the first two weeks after you quit. This will pass.  Find new activities to keep your hands busy. Play with a pen, coin, or rubber band. Doodle or draw things on paper.  Brush your teeth right after eating. This will help cut down the  craving for the taste of tobacco after meals. You can try mouthwash too.  Try gum, breath mints, or diet candy to keep something in your mouth. IF YOU SMOKE AND WANT TO QUIT:  Do not stock up on cigarettes. Never buy a carton. Wait until one pack is finished before you buy another.  Never carry cigarettes with you at work or at home.  Keep cigarettes as far away from you as possible. Leave them with someone else.  Never carry matches or a lighter with you.  Ask yourself, "Do I need this cigarette or is this just a reflex?"  Bet with someone that you can quit. Put cigarette money in a piggy bank every morning. If you smoke, you give up the money. If you do not smoke, by the end of the week, you keep the money.  Keep trying. It takes 21 days to change a habit!  Talk to your doctor about using medicines to help you quit. These include nicotine replacement gum, lozenges, or skin patches.   This information is not intended to replace advice given to you by your health care provider. Make sure you discuss any questions you have with your health care provider.   Document Released: 10/15/2008 Document Revised: 03/13/2011 Document Reviewed: 10/15/2008 Elsevier Interactive Patient Education [employer].

## 2014-10-07 NOTE — Progress Notes (Signed)
HPI:  Alan Mendoza is here to establish care. He is a pleasant AA male, who is deaf. He has a significant cardiac history for his age. 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 He is accompanied by an interpreter.   Last PCP and physical: July 2015 Immunizations:Does not have Tdap Diet: Tries to eat health Exercise: Walking, aerobic exercise.  Colonoscopy:Scheduled  Has the following chronic problems that require follow up and concerns today:  Erectile Dysfunction  - He has been having issues with ED for the last 2-3 years. He no longer has nocturnal erections. He does have history of CAD, DM and smokes >half pack cigarettes per day.     Diabetes - Is diet controlled. He does not check his blood sugars on a regular basis  Tobacco Use - He is smoking more than half a pack a day. He has tried to taper down in the past but was unsuccessful. Has not tried gums or patches. He would like to quit smoking.    ROS negative for unless reported above: fevers, chills,feeling poorly, unintentional weight loss,, vision loss, chest pain, palpitations, leg claudication, struggling to breath,Not feeling congested in the chest, no orthopenia, no cough,no wheezing, normal appetite, no soft tissue swelling, no hemoptysis, melena, hematochezia, hematuria, falls, loc, si, or thoughts of self harm.   Past Medical History  Diagnosis Date  . Coronary artery disease   . Myocardial infarction ([place])   . Arthritis   . 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 Stroke [facility])     denies  . Diabetes mellitus without complication ([place])     not on meds cks blood sugar weekly    Past Surgical History  Procedure Laterality Date  . Knee arthroscopy Left 1984  . Neck surgery  10/14  . Total knee arthroplasty Left  03/17/2014    Procedure: TOTAL KNEE ARTHROPLASTY;  Surgeon: Meredith Pel, MD;  Location: [facility];  Service: Orthopedics;  Laterality: Left;  . Cardiac catheterization  11/14    stent placement  . Exam under anesthesia with manipulation of knee Left 04/21/2014    Procedure: EXAM UNDER ANESTHESIA WITH MANIPULATION OF KNEE;  Surgeon: Meredith Pel, MD;  Location: [facility];  Service: Orthopedics;  Laterality: Left;  LEFT KNEE MANIPULATION UNDER ANESTHESIA    No family history on file.  Social History   Social History  . Marital Status: Single    Spouse Name: N/A  . Number of Children: N/A  . Years of Education: N/A   Social History Main Topics  . Smoking status: Current Every Day Smoker -- 0.50 packs/day for 33 years    Types: Cigarettes  . Smokeless tobacco: Never Used  . Alcohol Use: No  . Drug Use: No  . Sexual Activity: Not Asked   Other Topics Concern  . None   Social History Narrative    No current outpatient prescriptions on file.  EXAM:  Filed Vitals:   10/07/14 1259  BP: 120/72  Temp: 98.4 F (36.9 C)    Body mass index is 24.15 kg/(m^2).  GENERAL: vitals reviewed and listed above, alert, oriented, appears well hydrated and in no acute distress  HEENT: atraumatic, conjunttiva clear, no obvious abnormalities on inspection of external nose and ears. He is deaf  NECK: Neck is  soft and supple without masses, no adenopathy or thyromegaly, trachea midline, no JVD. Normal range of motion.   LUNGS: clear to auscultation bilaterally, no wheezes, rales or rhonchi, good air movement  CV: Regular rate and rhythm, normal S1/S2, no audible murmurs, gallops, or rubs. No carotid bruit and no peripheral edema.   MS: moves all extremities without noticeable abnormality. No edema noted  Abd: soft/nontender/nondistended/normal bowel sounds   Skin: warm and dry, no rash . Scar on left knee from previous surgery.   Extremities: No clubbing, cyanosis, or edema.  Capillary refill is WNL. Pulses intact bilaterally in upper and lower extremities.   Neuro: CN II-XII intact, sensation and reflexes normal throughout, 5/5 muscle strength in bilateral upper and lower extremities. Normal finger to nose. Normal rapid alternating movements.  PSYCH: pleasant and cooperative, no obvious depression or anxiety  1. Encounter to establish care - Follow up for CPE - Follow up sooner if needed - Work on diet and exercise  2. Erectile dysfunction, unspecified erectile dysfunction type - Likely due to smoking, diabetes, and/or cardiac issues - Will get labs at next visit including testosterone - See if he quits smoking if that helps with his erections - Consider referral to Neurology or Vascular - Consider medication therapy, but need to be careful due to nitrates  3. Tobacco use disorder - Spent about 5 minutes talking about smoking cessation. We reviewed options and way to quit.  - buPROPion (WELLBUTRIN SR) 150 MG 12 hr tablet; Take 1 tablet (150 mg total) by mouth 2 (two) times daily.  Dispense: 30 tablet; Refill: 6 - Will trial Wellbutrin for 12 weeks.  - Follow up at CPE   4. Encounter for immunization - Flu vaccination given    -We reviewed the PMH, PSH, FH, SH, Meds and Allergies. -We provided refills for any medications we will prescribe as needed. -We addressed current concerns per orders and patient instructions. -We have asked for records for pertinent exams, studies, vaccines and notes from previous providers. -We have advised patient to follow up per instructions below.   -Patient advised to return or notify a provider immediately if symptoms worsen or persist or new concerns arise.    Dorothyann Peng, AGNP

## 2014-11-03 ENCOUNTER — Ambulatory Visit (INDEPENDENT_AMBULATORY_CARE_PROVIDER_SITE_OTHER): Payer: Medicare Other | Admitting: Cardiovascular Disease

## 2014-11-03 ENCOUNTER — Encounter: Payer: Self-pay | Admitting: Cardiovascular Disease

## 2014-11-03 ENCOUNTER — Encounter: Payer: Self-pay | Admitting: Nurse Practitioner

## 2014-11-03 VITALS — BP 110/78 | HR 97 | Ht 67.0 in | Wt 147.8 lb

## 2014-11-03 DIAGNOSIS — I2510 Atherosclerotic heart disease of native coronary artery without angina pectoris: Secondary | ICD-10-CM | POA: Diagnosis not present

## 2014-11-03 DIAGNOSIS — I25119 Atherosclerotic heart disease of native coronary artery with unspecified angina pectoris: Secondary | ICD-10-CM | POA: Diagnosis not present

## 2014-11-03 LAB — BASIC METABOLIC PANEL
BUN: 13 mg/dL (ref 7–25)
CALCIUM: 9.6 mg/dL (ref 8.6–10.3)
CO2: 24 mmol/L (ref 20–31)
Chloride: 107 mmol/L (ref 98–110)
Creat: 0.9 mg/dL (ref 0.70–1.33)
GLUCOSE: 111 mg/dL — AB (ref 65–99)
Potassium: 3.8 mmol/L (ref 3.5–5.3)
SODIUM: 140 mmol/L (ref 135–146)

## 2014-11-03 LAB — HEPATIC FUNCTION PANEL
ALBUMIN: 4.1 g/dL (ref 3.6–5.1)
ALK PHOS: 75 U/L (ref 40–115)
ALT: 13 U/L (ref 9–46)
AST: 17 U/L (ref 10–35)
Bilirubin, Direct: 0.1 mg/dL (ref <=0.2)
Indirect Bilirubin: 0.3 mg/dL (ref 0.2–1.2)
TOTAL PROTEIN: 7.3 g/dL (ref 6.1–8.1)
Total Bilirubin: 0.4 mg/dL (ref 0.2–1.2)

## 2014-11-03 LAB — LIPID PANEL
CHOL/HDL RATIO: 6.2 ratio — AB (ref <=5.0)
Cholesterol: 192 mg/dL (ref 125–200)
HDL: 31 mg/dL — ABNORMAL LOW (ref >=40)
LDL CALC: 133 mg/dL — AB (ref <130)
Triglycerides: 142 mg/dL (ref <150)
VLDL: 28 mg/dL (ref <30)

## 2014-11-03 MED ORDER — NITROGLYCERIN 0.4 MG SL SUBL: 0.4000 mg | SUBLINGUAL_TABLET | SUBLINGUAL | Status: DC | PRN

## 2014-11-03 MED ORDER — ASPIRIN EC 81 MG PO TBEC: 81.0000 mg | DELAYED_RELEASE_TABLET | Freq: Every day | ORAL | Status: DC

## 2014-11-03 NOTE — Progress Notes (Signed)
Cardiology Office Note   Date:  11/03/2014   ID:  Alan Mendoza, DOB 1965-10-11, MRN [id number]  PCP:  Alan Peng, NP  Cardiologist:   Alan Headings, MD   Chief Complaint  Patient presents with  . Coronary Artery Disease   Problem list 1. History of coronary artery disease 2. Left total knee arthroplasty  , March 2016.   History of Present Illness: Alan Mendoza is a 49 y.o. male who presents for hx of CAD.   Had a stent place in [place], [place].   He is deaf.  Was seen with Alan Mendoza.  No CP since that time. Walks on occasion. Training for a new job packaging No recent NTG.   He stopped taking Brilinta and aspirin after one year of therapy. He's not had his cholesterol levels checked in years. He does not know if his levels are elevated.   Past Medical History  Diagnosis Date  . Coronary artery disease   . Myocardial infarction ([place])     2014  . Arthritis   . 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 Stroke [facility])     denies  . Diabetes mellitus without complication ([place])     not on meds cks blood sugar weekly    Past Surgical History  Procedure Laterality Date  . Knee arthroscopy Left 1984  . Neck surgery  10/14  . Total knee arthroplasty Left 03/17/2014    Procedure: TOTAL KNEE ARTHROPLASTY;  Surgeon: Alan Pel, MD;  Location: [place];  Service: Orthopedics;  Laterality: Left;  . Cardiac catheterization  11/14    stent placement  . Exam under anesthesia with manipulation of knee Left 04/21/2014    Procedure: EXAM UNDER ANESTHESIA WITH MANIPULATION OF KNEE;  Surgeon: Alan Pel, MD;  Location: [place];  Service: Orthopedics;  Laterality: Left;  LEFT KNEE MANIPULATION UNDER ANESTHESIA     Current Outpatient Prescriptions  Medication Sig Dispense Refill  . amitriptyline (ELAVIL) 25 MG tablet Take 25 mg by mouth at bedtime as needed for sleep.    Marland Kitchen buPROPion (WELLBUTRIN SR) 150  MG 12 hr tablet Take 1 tablet (150 mg total) by mouth 2 (two) times daily. 30 tablet 6   No current facility-administered medications for this visit.    Allergies:   Review of patient's allergies indicates no known allergies.    Social History:  The patient  reports that he has been smoking Cigarettes.  He has a 16.5 pack-year smoking history. He has never used smokeless tobacco. He reports that he does not drink alcohol or use illicit drugs.   Family History:  The patient's family history includes Alcoholism in an other family member; Breast cancer in an other family member; Diabetes in his mother; Hypertension in his mother and another family member; Kidney disease in an other family member; Osteoarthritis in his mother.    ROS:  Please see the history of present illness.    Review of Systems: Constitutional:  denies fever, chills, diaphoresis, appetite change and fatigue.  HEENT: denies photophobia, eye pain, redness, hearing loss, ear pain, congestion, sore throat, rhinorrhea, sneezing, neck pain, neck stiffness and tinnitus.  Respiratory: denies SOB, DOE, cough, chest tightness, and wheezing.  Cardiovascular: denies chest pain, palpitations and leg swelling.  Gastrointestinal: denies nausea, vomiting, abdominal pain, diarrhea, constipation, blood in stool.  Genitourinary: denies dysuria, urgency, frequency, hematuria, flank pain and difficulty urinating.  Musculoskeletal: denies  myalgias, back pain, joint swelling, arthralgias and gait problem.   Skin: denies pallor, rash and wound.  Neurological: denies dizziness, seizures, syncope, weakness, light-headedness, numbness and headaches.   Hematological: denies adenopathy, easy bruising, personal or family bleeding history.  Psychiatric/ Behavioral: denies suicidal ideation, mood changes, confusion, nervousness, sleep disturbance and agitation.       All other systems are reviewed and negative.    PHYSICAL EXAM: VS:  BP 110/78  mmHg  Pulse 97  Ht '5\' 7"'$  (1.702 m)  Wt 147 lb 12.8 oz (67.042 kg)  BMI 23.14 kg/m2 , BMI Body mass index is 23.14 kg/(m^2). GEN: Well nourished, well developed, in no acute distress HEENT: normal Neck: no JVD, carotid bruits, or masses Cardiac: RRR; no murmurs, rubs, or gallops,no edema  Respiratory:  clear to auscultation bilaterally, normal work of breathing GI: soft, nontender, nondistended, + BS MS: no deformity or atrophy Skin: warm and dry, no rash Neuro:  Strength and sensation are intact Psych: normal   EKG:  EKG is ordered today. The ekg ordered today demonstrates NSR at 97.  NS ST abn.    Recent Labs: 04/17/2014: BUN 9; Creatinine, Ser 0.75; Hemoglobin 13.4; Platelets 271; Potassium 4.4; Sodium 141    Lipid Panel No results found for: CHOL, TRIG, HDL, CHOLHDL, VLDL, LDLCALC, LDLDIRECT    Wt Readings from Last 3 Encounters:  11/03/14 147 lb 12.8 oz (67.042 kg)  10/07/14 154 lb 3.2 oz (69.945 kg)  04/21/14 143 lb (64.864 kg)      Other studies Reviewed: Additional studies/ records that were reviewed today include: . Review of the above records demonstrates:    ASSESSMENT AND PLAN:  1.  CAD - s/p stenting in 2014 in [facility] .  Doing well.  No angina .   Still smoking , encouraged him to stop  No recent cholesterol levels. Will check lipids, liver enz, BMP  today   Will see him back in 1 year    Current medicines are reviewed at length with the patient today.  The patient does not have concerns regarding medicines.  The following changes have been made:  no change  Labs/ tests ordered today include:  No orders of the defined types were placed in this encounter.     Disposition:   FU with me in 1 year     Alan Mendoza, Alan Cheng, MD  11/03/2014 7:46 AM    [facility] [place], [place], [place]  [address] Phone: [phone]; Fax: [phone]   [facility]  [address] [place], [place]  [address] [phone]   Fax [phone]

## 2014-11-03 NOTE — Patient Instructions (Signed)
Medication Instructions:  TAKE Aspirin 81 mg once daily   Labwork: TODAY - cholesterol, liver, basic metabolic panel   Testing/Procedures: None Ordered   Follow-Up: Your physician wants you to follow-up in: 1 year with Dr. Acie Fredrickson.  You will receive a reminder letter in the mail two months in advance. If you don't receive a letter, please call our office to schedule the follow-up appointment.   If you need a refill on your cardiac medications before your next appointment, please call your pharmacy.   Thank you for choosing [facility]! Christen Bame, RN [phone]

## 2014-11-05 ENCOUNTER — Telehealth: Payer: Self-pay | Admitting: Nurse Practitioner

## 2014-11-05 DIAGNOSIS — E785 Hyperlipidemia, unspecified: Secondary | ICD-10-CM

## 2014-11-05 MED ORDER — ATORVASTATIN CALCIUM 40 MG PO TABS: 40.0000 mg | ORAL_TABLET | Freq: Every day | ORAL | Status: DC

## 2014-11-05 NOTE — Telephone Encounter (Signed)
Notes Recorded by Thayer Headings, MD on 11/03/2014 at 5:32 PM Alan Mendoza has a hx of stenting.  LDL is 133. Start atorvastatin 40 mg Day .  ckeck fasting lipids, liver, BMP in 3 months  Spoke with patient through sign language interpreter [phone].  Reviewed lab results and plan of care.  Patient verbalized agreement through interpreter to start Atorvastatin 40 mg once daily and to return 02/09/15 for 3 month recheck of lab work.  I advised him to call back with questions or concerns.

## 2014-11-19 ENCOUNTER — Other Ambulatory Visit (INDEPENDENT_AMBULATORY_CARE_PROVIDER_SITE_OTHER): Payer: Medicare Other

## 2014-11-19 DIAGNOSIS — Z0000 Encounter for general adult medical examination without abnormal findings: Secondary | ICD-10-CM

## 2014-11-19 DIAGNOSIS — E785 Hyperlipidemia, unspecified: Secondary | ICD-10-CM | POA: Diagnosis not present

## 2014-11-19 DIAGNOSIS — E119 Type 2 diabetes mellitus without complications: Secondary | ICD-10-CM | POA: Diagnosis not present

## 2014-11-19 DIAGNOSIS — E039 Hypothyroidism, unspecified: Secondary | ICD-10-CM

## 2014-11-19 DIAGNOSIS — D649 Anemia, unspecified: Secondary | ICD-10-CM | POA: Diagnosis not present

## 2014-11-19 DIAGNOSIS — I519 Heart disease, unspecified: Secondary | ICD-10-CM

## 2014-11-19 DIAGNOSIS — N400 Enlarged prostate without lower urinary tract symptoms: Secondary | ICD-10-CM | POA: Diagnosis not present

## 2014-11-19 LAB — LIPID PANEL
CHOL/HDL RATIO: 3
Cholesterol: 114 mg/dL (ref 0–200)
HDL: 35.2 mg/dL — AB (ref >39.00)
LDL Cholesterol: 62 mg/dL (ref 0–99)
NonHDL: 79.14
Triglycerides: 87 mg/dL (ref 0.0–149.0)
VLDL: 17.4 mg/dL (ref 0.0–40.0)

## 2014-11-19 LAB — HEPATIC FUNCTION PANEL
ALT: 21 U/L (ref 0–53)
AST: 20 U/L (ref 0–37)
Albumin: 4.4 g/dL (ref 3.5–5.2)
Alkaline Phosphatase: 101 U/L (ref 39–117)
BILIRUBIN DIRECT: 0.1 mg/dL (ref 0.0–0.3)
BILIRUBIN TOTAL: 0.4 mg/dL (ref 0.2–1.2)
Total Protein: 7.3 g/dL (ref 6.0–8.3)

## 2014-11-19 LAB — BASIC METABOLIC PANEL
BUN: 16 mg/dL (ref 6–23)
CALCIUM: 9.5 mg/dL (ref 8.4–10.5)
CO2: 26 mEq/L (ref 19–32)
Chloride: 109 mEq/L (ref 96–112)
Creatinine, Ser: 0.84 mg/dL (ref 0.40–1.50)
GFR: 123.5 mL/min (ref >60.00)
Glucose, Bld: 116 mg/dL — ABNORMAL HIGH (ref 70–99)
POTASSIUM: 4.8 meq/L (ref 3.5–5.1)
SODIUM: 143 meq/L (ref 135–145)

## 2014-11-19 LAB — POCT URINALYSIS DIPSTICK
BILIRUBIN UA: NEGATIVE
Glucose, UA: NEGATIVE
KETONES UA: NEGATIVE
Leukocytes, UA: NEGATIVE
NITRITE UA: NEGATIVE
Protein, UA: NEGATIVE
Spec Grav, UA: >=1.03
Urobilinogen, UA: 1
pH, UA: 5.5

## 2014-11-19 LAB — PSA: PSA: 0.47 ng/mL (ref 0.10–4.00)

## 2014-11-19 LAB — CBC WITH DIFFERENTIAL/PLATELET
BASOS ABS: 0 10*3/uL (ref 0.0–0.1)
Basophils Relative: 0.3 % (ref 0.0–3.0)
EOS PCT: 5.1 % — AB (ref 0.0–5.0)
Eosinophils Absolute: 0.3 10*3/uL (ref 0.0–0.7)
HCT: 45.8 % (ref 39.0–52.0)
Hemoglobin: 15 g/dL (ref 13.0–17.0)
LYMPHS ABS: 2.1 10*3/uL (ref 0.7–4.0)
Lymphocytes Relative: 33.7 % (ref 12.0–46.0)
MCHC: 32.7 g/dL (ref 30.0–36.0)
MCV: 85.7 fl (ref 78.0–100.0)
Monocytes Absolute: 0.7 10*3/uL (ref 0.1–1.0)
Monocytes Relative: 10.5 % (ref 3.0–12.0)
NEUTROS ABS: 3.2 10*3/uL (ref 1.4–7.7)
NEUTROS PCT: 50.4 % (ref 43.0–77.0)
PLATELETS: 333 10*3/uL (ref 150.0–400.0)
RBC: 5.34 Mil/uL (ref 4.22–5.81)
RDW: 13.7 % (ref 11.5–15.5)
WBC: 6.4 10*3/uL (ref 4.0–10.5)

## 2014-11-19 LAB — TSH: TSH: 1.01 u[IU]/mL (ref 0.35–4.50)

## 2014-11-20 LAB — HEMOGLOBIN A1C: Hgb A1c MFr Bld: 6.3 % (ref 4.6–6.5)

## 2014-11-24 ENCOUNTER — Encounter: Payer: Self-pay | Admitting: Adult Health

## 2014-11-24 ENCOUNTER — Ambulatory Visit (INDEPENDENT_AMBULATORY_CARE_PROVIDER_SITE_OTHER): Payer: Medicare Other | Admitting: Adult Health

## 2014-11-24 DIAGNOSIS — Z0000 Encounter for general adult medical examination without abnormal findings: Secondary | ICD-10-CM

## 2014-11-24 DIAGNOSIS — M5412 Radiculopathy, cervical region: Secondary | ICD-10-CM

## 2014-11-24 DIAGNOSIS — E785 Hyperlipidemia, unspecified: Secondary | ICD-10-CM

## 2014-11-24 DIAGNOSIS — N529 Male erectile dysfunction, unspecified: Secondary | ICD-10-CM

## 2014-11-24 DIAGNOSIS — F17200 Nicotine dependence, unspecified, uncomplicated: Secondary | ICD-10-CM

## 2014-11-24 DIAGNOSIS — Z1211 Encounter for screening for malignant neoplasm of colon: Secondary | ICD-10-CM

## 2014-11-24 MED ORDER — VARENICLINE TARTRATE 0.5 MG X 11 & 1 MG X 42 PO MISC: ORAL | Status: DC

## 2014-11-24 MED ORDER — VARENICLINE TARTRATE 1 MG PO TABS: 1.0000 mg | ORAL_TABLET | Freq: Two times a day (BID) | ORAL | Status: DC

## 2014-11-24 MED ORDER — TADALAFIL 20 MG PO TABS: 10.0000 mg | ORAL_TABLET | ORAL | Status: DC | PRN

## 2014-11-24 MED ORDER — METHYLPREDNISOLONE 4 MG PO TBPK: ORAL_TABLET | ORAL | Status: DC

## 2014-11-24 NOTE — Progress Notes (Signed)
Subjective:    Patient ID: Alan Mendoza, male    DOB: 11/20/1965, 49 y.o.   MRN: [id number]  HPI  Patient presents for yearly preventative medicine examination. 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 He presents with a sign language interpreter.  Medicare wellness form completed and reviewed  All immunizations and health maintenance protocols were reviewed with the patient and needed orders were placed.  Appropriate screening laboratory values were ordered for the patient including screening of hyperlipidemia, renal function and hepatic function. If indicated by BPH, a PSA was ordered.   Medication reconciliation,  past medical history, social history, problem list and allergies were reviewed in detail with the patient  Goals were established with regard to weight loss, exercise, and  diet in compliance with medications   He has not had a colonoscopy since turning 48. He would like to schedule one  He endorses that he continues to smoke despite trying Wellbutrin. He does not feel like the Wellbutrin helped at all. He would like to try chantix. He smokes 5-6 cigarettes per day.   He also continues to have issues with cervical radiculopathy on his right side. This has been a chronic issue, has not been any better and has not gotten any worse.   Review of Systems  Constitutional: Negative.   HENT: Negative.   Eyes: Negative.   Respiratory: Negative.   Cardiovascular: Negative.   Gastrointestinal: Negative.   Endocrine: Negative.   Genitourinary: Negative.   Musculoskeletal: Negative.   Skin: Negative.   Allergic/Immunologic: Negative.   Neurological: Negative.   Hematological: Negative.   Psychiatric/Behavioral: Negative.   All other systems reviewed and are negative.  Past Medical History    Diagnosis Date  . Coronary artery disease   . Myocardial infarction ([place])     2014  . Arthritis   . 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 Stroke [facility])     denies  . Diabetes mellitus without complication ([place])     not on meds cks blood sugar weekly    Social History   Social History  . Marital Status: Single    Spouse Name: N/A  . Number of Children: N/A  . Years of Education: N/A   Occupational History  . Not on file.   Social History Main Topics  . Smoking status: Light Tobacco Smoker -- 0.50 packs/day for 33 years    Types: Cigarettes  . Smokeless tobacco: Never Used  . Alcohol Use: No  . Drug Use: No  . Sexual Activity: Not on file   Other Topics Concern  . 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        Past Surgical History  Procedure Laterality Date  . Knee arthroscopy Left 1984  . Neck surgery  10/14  . Total knee arthroplasty Left 03/17/2014    Procedure: TOTAL KNEE ARTHROPLASTY;  Surgeon: Meredith Pel, MD;  Location: [place];  Service: Orthopedics;  Laterality: Left;  . Cardiac catheterization  11/14    stent placement  . Exam under anesthesia with manipulation of knee Left 04/21/2014    Procedure: EXAM UNDER ANESTHESIA WITH MANIPULATION OF KNEE;  Surgeon: Meredith Pel, MD;  Location: [place];  Service: Orthopedics;  Laterality: Left;  LEFT KNEE MANIPULATION UNDER ANESTHESIA    Family History  Problem Relation Age of Onset  . Diabetes Mother   . Osteoarthritis Mother   . Hypertension Mother   . Alcoholism    . Breast cancer    . Hypertension    . Kidney disease      No Known Allergies  Current Outpatient Prescriptions on File Prior to Visit  Medication Sig Dispense Refill  . buPROPion (WELLBUTRIN SR) 150 MG 12 hr tablet Take 1 tablet (150 mg total) by mouth 2 (two) times daily. 30 tablet 6  . amitriptyline (ELAVIL) 25 MG tablet Take 25 mg  by mouth at bedtime as needed for sleep.    Marland Kitchen aspirin EC 81 MG tablet Take 1 tablet (81 mg total) by mouth daily. 90 tablet 3  . atorvastatin (LIPITOR) 40 MG tablet Take 1 tablet (40 mg total) by mouth daily at 6 PM. 31 tablet 11  . nitroGLYCERIN (NITROSTAT) 0.4 MG SL tablet Place 1 tablet (0.4 mg total) under the tongue every 5 (five) minutes as needed for chest pain. 25 tablet 6   No current facility-administered medications on file prior to visit.    BP 110/70 mmHg  Temp(Src) 98.1 F (36.7 C) (Oral)  Ht '5\' 7"'$  (1.702 m)  Wt 152 lb 14.4 oz (69.355 kg)  BMI 23.94 kg/m2       Objective:   Physical Exam  Constitutional: He is oriented to person, place, and time. He appears well-developed and well-nourished. No distress.  HENT:  Head: Normocephalic and atraumatic.  Right Ear: External ear normal.  Left Ear: External ear normal.  Nose: Nose normal.  Mouth/Throat: Oropharynx is clear and moist. No oropharyngeal exudate.  Eyes: Conjunctivae and EOM are normal. Pupils are equal, round, and reactive to light. Right eye exhibits no discharge. Left eye exhibits no discharge. No scleral icterus.  Neck: Normal range of motion. Neck supple. No JVD present. No tracheal deviation present. No thyromegaly present.  Cardiovascular: Normal rate, regular rhythm, normal heart sounds and intact distal pulses.  Exam reveals no gallop and no friction rub.   No murmur heard. No carotid bruit  Pulmonary/Chest: Effort normal and breath sounds normal. No respiratory distress. He has no wheezes. He has no rales. He exhibits no tenderness.  Abdominal: Soft. Bowel sounds are normal. He exhibits no distension and no mass. There is no tenderness. There is no rebound and no guarding.  Genitourinary: Prostate normal and penis normal. Guaiac negative stool. No penile tenderness.  External hemorrhoid   Musculoskeletal: Normal range of motion. He exhibits no edema or tenderness.  Lymphadenopathy:    He has no  cervical adenopathy.  Neurological: He is alert and oriented to person, place, and time. He has normal reflexes.  Skin: Skin is warm and dry. No rash noted. He is not diaphoretic. No erythema. No pallor.  Psychiatric: He has a normal mood and affect. His behavior is normal. Judgment and thought content normal.  Nursing note and vitals reviewed.      Assessment & Plan:  1. Erectile dysfunction, unspecified erectile dysfunction type - tadalafil (CIALIS) 20 MG tablet; Take 0.5-1 tablets (10-20 mg total) by mouth every other day as needed for erectile dysfunction.  Dispense: 5 tablet; Refill: 11 - Do not take with Nitroglycerin - If he becomes dizzy or lightheaded he is to stop medication and notify me immediatly.  2. Cervical radiculitis - methylPREDNISolone (MEDROL DOSEPAK) 4 MG  TBPK tablet; Take as directed  Dispense: 21 tablet; Refill: 0 - Advised on stretching exercises and applying heat to the area  3. Tobacco use disorder - varenicline (CHANTIX CONTINUING MONTH PAK) 1 MG tablet; Take 1 tablet (1 mg total) by mouth 2 (two) times daily.  Dispense: 60 tablet; Refill: 0 - varenicline (CHANTIX STARTING MONTH PAK) 0.5 MG X 11 & 1 MG X 42 tablet; Take one 0.5 mg tablet by mouth once daily for 3 days, then increase to one 0.5 mg tablet twice daily for 4 days, then increase to one 1 mg tablet twice daily.  Dispense: 53 tablet; Refill: 0 -Trial of chantix. Common side effects including rare risk of suicide ideation was discussed with the patient today.  Patient is instructed to go directly to the ED if this occurs.  We discussed that patient can continue to smoke for 1 week after starting chantix, but then must discontinue cigarettes.  He is also instructed to contact us prior to completion of the starter month pack for an rx for the continuation month pack.  5 minutes spent with patient today on tobacco cessation counseling.    4. Hyperlipidemia - Controlled on Lipitor  5. Medicare annual  wellness visit, subsequent - Reviewed labs that had previously been drawn - Work on diet and exercise.  - Follow up in one year for MWE or sooner if needed.  6. Colon cancer screening - Ambulatory referral to Gastroenterology

## 2014-11-24 NOTE — Progress Notes (Signed)
Pre visit review using our clinic review tool, if applicable. No additional management support is needed unless otherwise documented below in the visit note. 

## 2014-11-24 NOTE — Patient Instructions (Addendum)
It was great seeing you again!  Take the prednisone as directed for the pinched nerve  Use the Cialis as needed - DO NOT TAKE YOUR NITROGLYCERIN with this  Take the chantix as directed. If you have bad dreams or thoughts of hurting yourself, please stop the medication and let me know.    Someone will call you to schedule the colonoscopy

## 2015-02-09 ENCOUNTER — Other Ambulatory Visit: Payer: Medicare Other

## 2016-01-17 DIAGNOSIS — J069 Acute upper respiratory infection, unspecified: Secondary | ICD-10-CM | POA: Diagnosis not present

## 2016-05-25 IMAGING — CT CT CHEST W/O CM
1 of 4 series · 15 of 30 positions shown, 19 images · non-contrast
Comparison: Chest radiographs 03/05/2014

CLINICAL DATA: Aspiration pneumonia. Abnormal chest x-ray. Cough.
Smoker.

EXAM:
CT CHEST WITHOUT CONTRAST
TECHNIQUE: Multidetector CT imaging of the chest was performed following the
standard protocol without IV contrast..

[Series 2: thin/ super d · axial · 0.61mm/px · z∈[-309,-2]mm · 15 of 350 slices shown, 19 images]
[im 22/350  mediastinal]
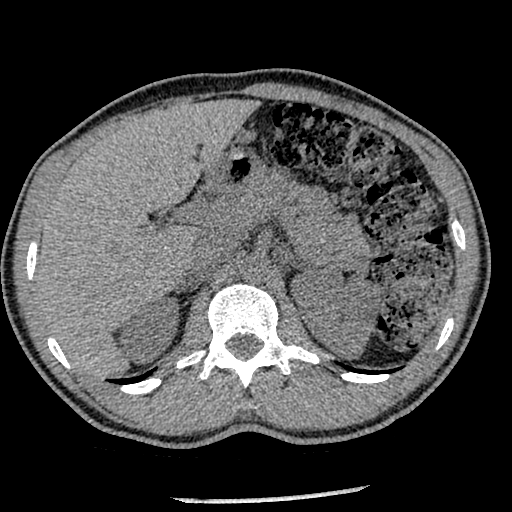
[im 22/350  lung]
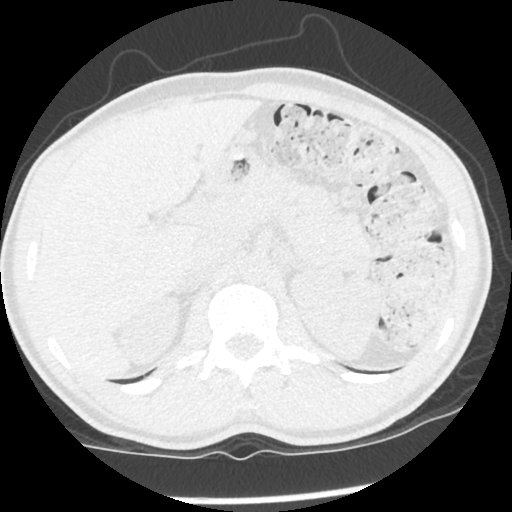
[im 44/350  lung]
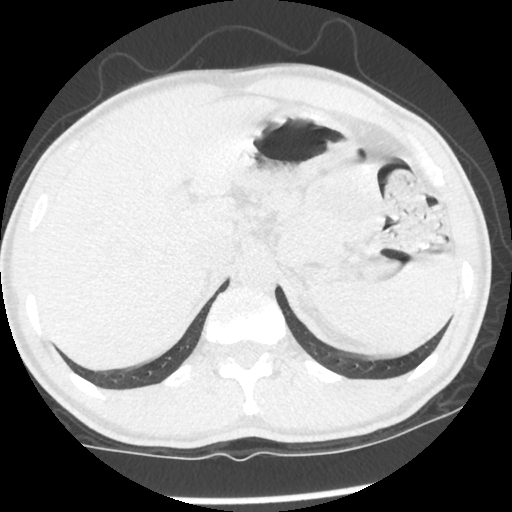
[im 66/350  lung]
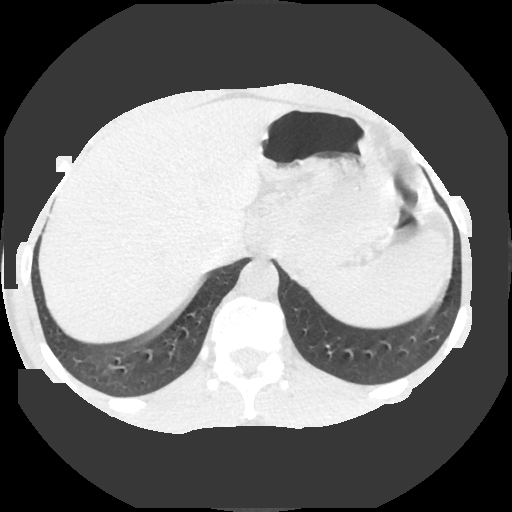
[im 88/350  lung]
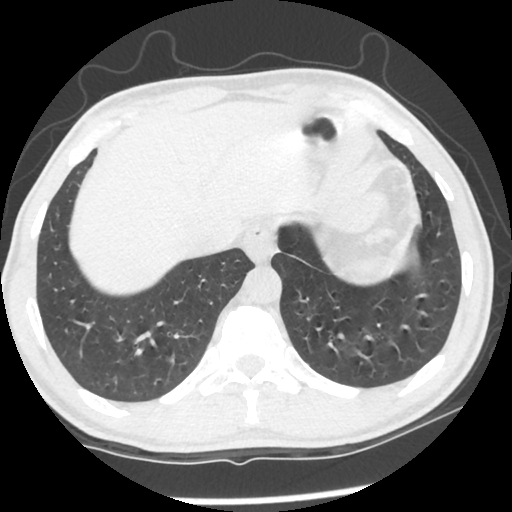
[im 110/350  mediastinal]
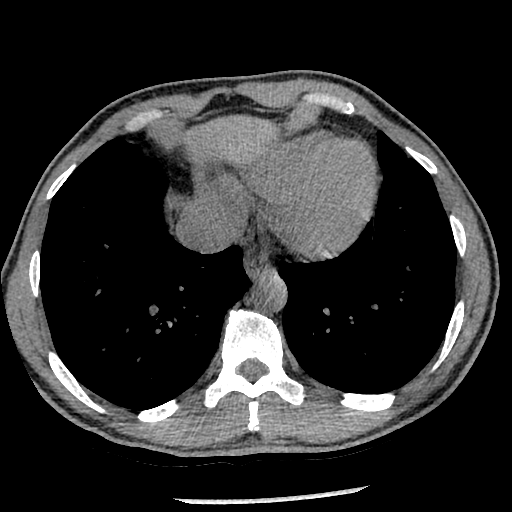
[im 110/350  lung]
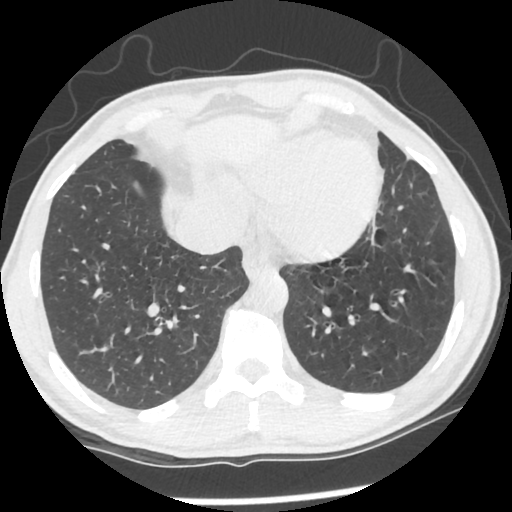
[im 131/350  lung]
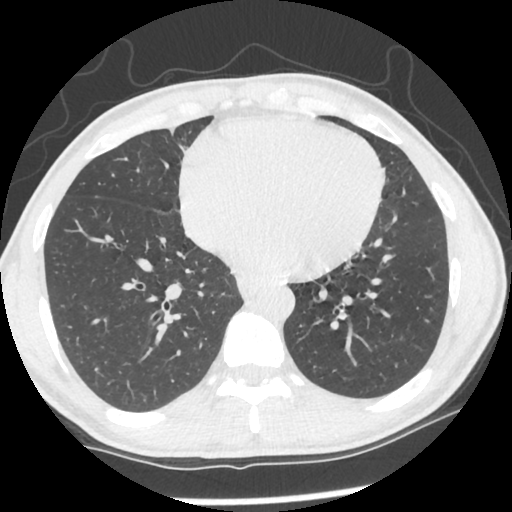
[im 153/350  lung]
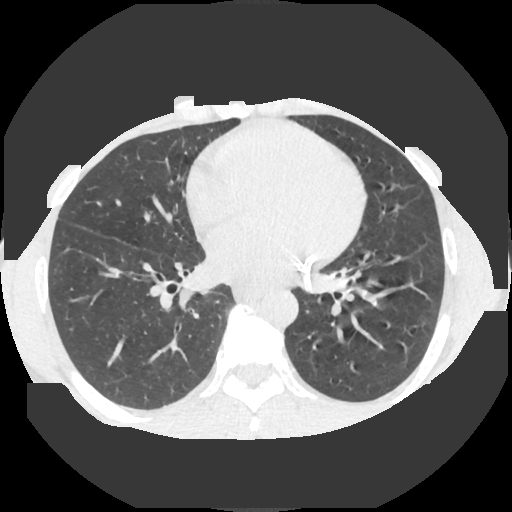
[im 175/350  lung]
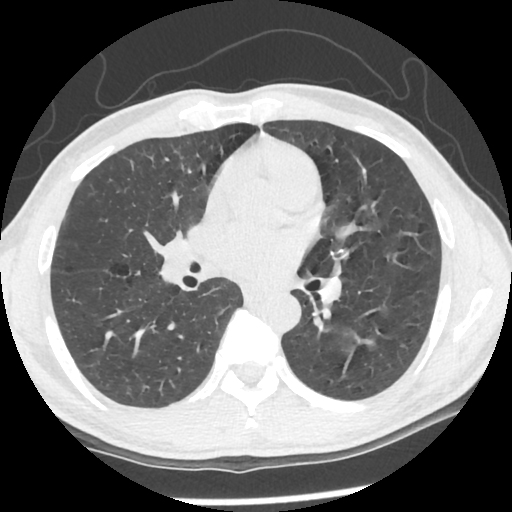
[im 197/350  mediastinal]
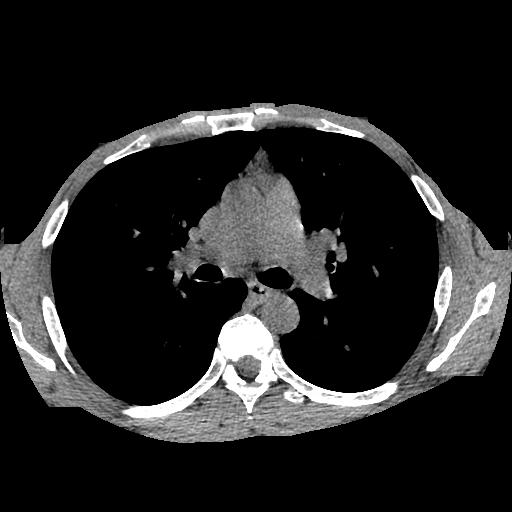
[im 197/350  lung]
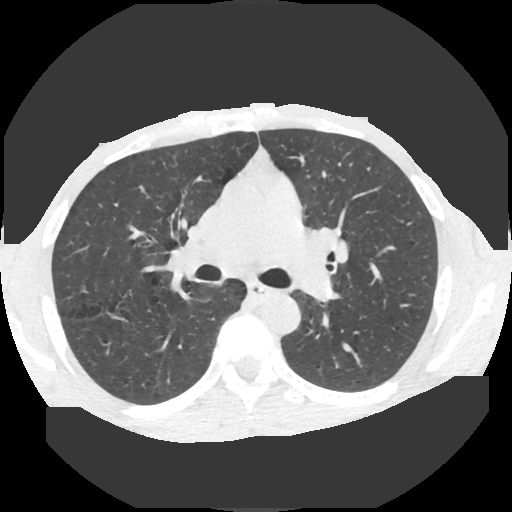
[im 219/350  lung]
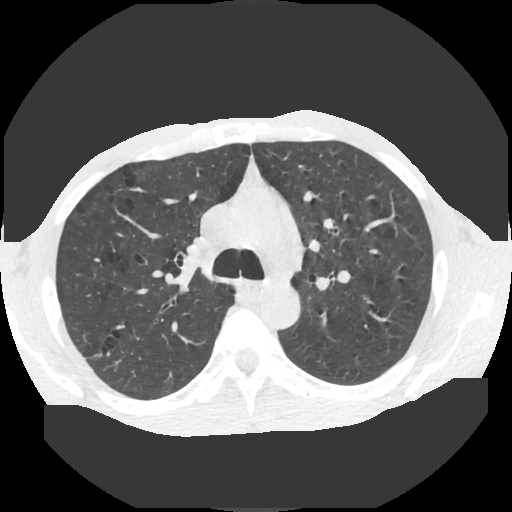
[im 240/350  lung]
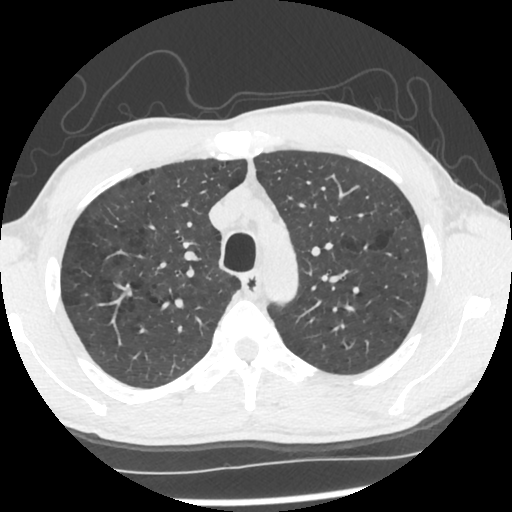
[im 262/350  lung]
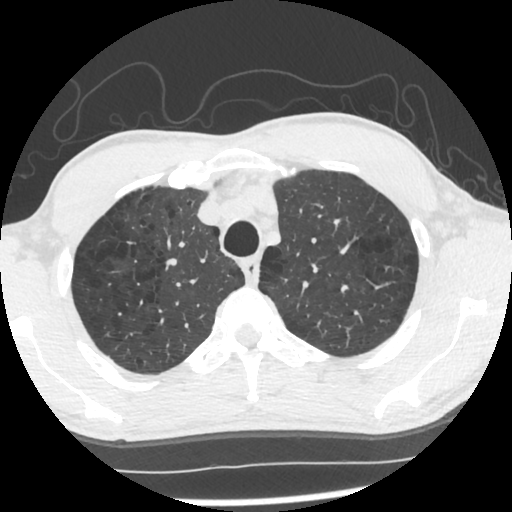
[im 284/350  mediastinal]
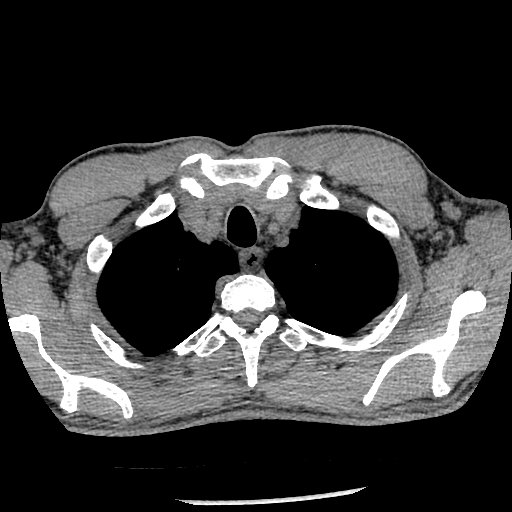
[im 284/350  lung]
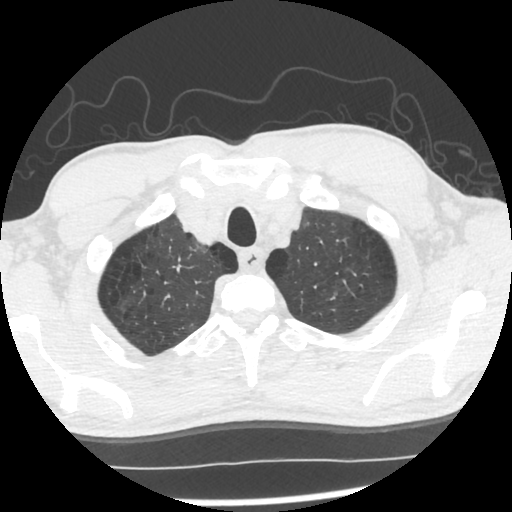
[im 306/350  lung]
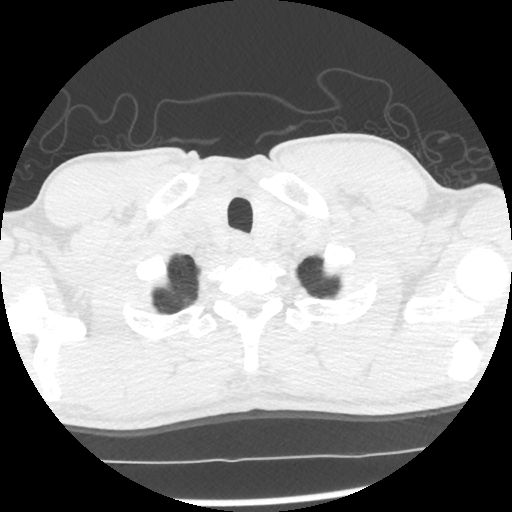
[im 328/350  lung]
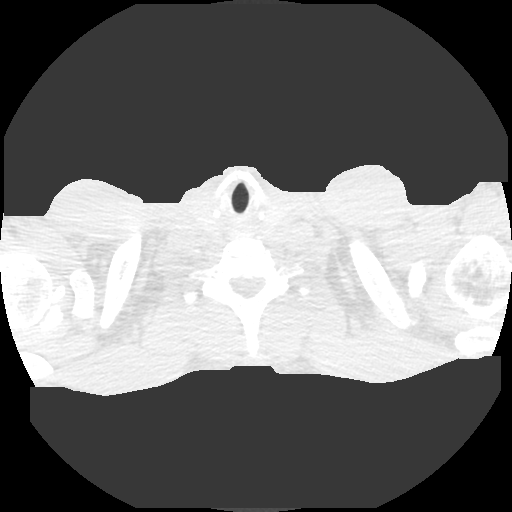

[15 of 30 positions shown; findings below may reference images not displayed]

FINDINGS: No enlarged axillary, mediastinal, or hilar lymph nodes are
identified. Prior coronary artery stenting is noted. The heart is
normal in size. No significant atherosclerotic calcification is
identified. There is no pleural or pericardial effusion.

Major airways appear patent. There is mild to moderate centrilobular
emphysema. There is mild bronchial wall thickening. There is a 4 mm
nodule in the right lower lobe (series 4, image 38). No airspace
consolidation is seen.

The visualized portion of the upper abdomen is unremarkable. Mild
thoracic spondylosis is noted.
IMPRESSION: 1. Emphysema.
2. 4 mm right lower lobe nodule. Given risk factors for bronchogenic
carcinoma, follow-up chest CT at 1 year is recommended. This
recommendation follows the consensus statement: Guidelines for
Management of Small Pulmonary Nodules Detected on CT Scans: A
Statement from the [HOSPITAL] as published in Radiology

## 2016-06-14 ENCOUNTER — Encounter: Payer: Self-pay | Admitting: Adult Health

## 2016-06-14 ENCOUNTER — Ambulatory Visit (INDEPENDENT_AMBULATORY_CARE_PROVIDER_SITE_OTHER): Payer: Medicare Other | Admitting: Adult Health

## 2016-06-14 ENCOUNTER — Other Ambulatory Visit: Payer: Self-pay

## 2016-06-14 VITALS — BP 118/60 | Temp 98.5°F | Ht 67.0 in | Wt 153.6 lb

## 2016-06-14 DIAGNOSIS — E119 Type 2 diabetes mellitus without complications: Secondary | ICD-10-CM

## 2016-06-14 DIAGNOSIS — F17200 Nicotine dependence, unspecified, uncomplicated: Secondary | ICD-10-CM | POA: Diagnosis not present

## 2016-06-14 DIAGNOSIS — Z1211 Encounter for screening for malignant neoplasm of colon: Secondary | ICD-10-CM

## 2016-06-14 DIAGNOSIS — Z1159 Encounter for screening for other viral diseases: Secondary | ICD-10-CM | POA: Diagnosis not present

## 2016-06-14 DIAGNOSIS — E782 Mixed hyperlipidemia: Secondary | ICD-10-CM

## 2016-06-14 DIAGNOSIS — I25119 Atherosclerotic heart disease of native coronary artery with unspecified angina pectoris: Secondary | ICD-10-CM

## 2016-06-14 DIAGNOSIS — N400 Enlarged prostate without lower urinary tract symptoms: Secondary | ICD-10-CM

## 2016-06-14 LAB — LIPID PANEL
CHOL/HDL RATIO: 6
Cholesterol: 215 mg/dL — ABNORMAL HIGH (ref 0–200)
HDL: 35.3 mg/dL — ABNORMAL LOW (ref >39.00)
LDL CALC: 143 mg/dL — AB (ref 0–99)
NonHDL: 179.2
TRIGLYCERIDES: 183 mg/dL — AB (ref 0.0–149.0)
VLDL: 36.6 mg/dL (ref 0.0–40.0)

## 2016-06-14 LAB — BASIC METABOLIC PANEL
BUN: 14 mg/dL (ref 6–23)
CALCIUM: 10.2 mg/dL (ref 8.4–10.5)
CO2: 27 mEq/L (ref 19–32)
CREATININE: 0.93 mg/dL (ref 0.40–1.50)
Chloride: 109 mEq/L (ref 96–112)
GFR: 109.15 mL/min (ref >60.00)
Glucose, Bld: 110 mg/dL — ABNORMAL HIGH (ref 70–99)
Potassium: 4.4 mEq/L (ref 3.5–5.1)
Sodium: 142 mEq/L (ref 135–145)

## 2016-06-14 LAB — MICROALBUMIN / CREATININE URINE RATIO
Creatinine,U: 279 mg/dL
MICROALB UR: 1.2 mg/dL (ref 0.0–1.9)
Microalb Creat Ratio: 0.4 mg/g (ref 0.0–30.0)

## 2016-06-14 LAB — CBC WITH DIFFERENTIAL/PLATELET
BASOS ABS: 0 10*3/uL (ref 0.0–0.1)
Basophils Relative: 0.4 % (ref 0.0–3.0)
EOS ABS: 0.2 10*3/uL (ref 0.0–0.7)
Eosinophils Relative: 2.8 % (ref 0.0–5.0)
HEMATOCRIT: 49.4 % (ref 39.0–52.0)
HEMOGLOBIN: 16.3 g/dL (ref 13.0–17.0)
Lymphocytes Relative: 36.3 % (ref 12.0–46.0)
Lymphs Abs: 1.9 10*3/uL (ref 0.7–4.0)
MCHC: 32.9 g/dL (ref 30.0–36.0)
MCV: 85.8 fl (ref 78.0–100.0)
Monocytes Absolute: 0.4 10*3/uL (ref 0.1–1.0)
Monocytes Relative: 7.1 % (ref 3.0–12.0)
Neutro Abs: 2.9 10*3/uL (ref 1.4–7.7)
Neutrophils Relative %: 53.4 % (ref 43.0–77.0)
Platelets: 283 10*3/uL (ref 150.0–400.0)
RBC: 5.76 Mil/uL (ref 4.22–5.81)
RDW: 13.1 % (ref 11.5–15.5)
WBC: 5.3 10*3/uL (ref 4.0–10.5)

## 2016-06-14 LAB — HEPATIC FUNCTION PANEL
ALT: 12 U/L (ref 0–53)
AST: 13 U/L (ref 0–37)
Albumin: 4.8 g/dL (ref 3.5–5.2)
Alkaline Phosphatase: 85 U/L (ref 39–117)
BILIRUBIN DIRECT: 0.1 mg/dL (ref 0.0–0.3)
BILIRUBIN TOTAL: 0.6 mg/dL (ref 0.2–1.2)
Total Protein: 7.6 g/dL (ref 6.0–8.3)

## 2016-06-14 LAB — HEMOGLOBIN A1C: Hgb A1c MFr Bld: 6.5 % (ref 4.6–6.5)

## 2016-06-14 LAB — PSA: PSA: 0.51 ng/mL (ref 0.10–4.00)

## 2016-06-14 LAB — TSH: TSH: 1.22 u[IU]/mL (ref 0.35–4.50)

## 2016-06-14 MED ORDER — ALBUTEROL SULFATE HFA 108 (90 BASE) MCG/ACT IN AERS: 2.0000 | INHALATION_SPRAY | Freq: Four times a day (QID) | RESPIRATORY_TRACT | 2 refills | Status: DC | PRN

## 2016-06-14 MED ORDER — TRIAMCINOLONE ACETONIDE 0.5 % EX OINT: 1.0000 "application " | TOPICAL_OINTMENT | Freq: Two times a day (BID) | CUTANEOUS | 2 refills | Status: DC

## 2016-06-14 MED ORDER — ATORVASTATIN CALCIUM 40 MG PO TABS: 40.0000 mg | ORAL_TABLET | Freq: Every day | ORAL | 3 refills | Status: DC

## 2016-06-14 MED ORDER — METFORMIN HCL 500 MG PO TABS: 500.0000 mg | ORAL_TABLET | Freq: Every day | ORAL | 3 refills | Status: DC

## 2016-06-14 NOTE — Patient Instructions (Signed)
It was great seeing you today !  I will follow up with you regarding your blood work  I will send in a cream called Kenalog to help with the rash   I am also going to send in an inhaler for the times where you feel short of breath. Please stop smoking   Work in diet and exercise  Follow up with Cardiology   I will see oyu in one year

## 2016-06-14 NOTE — Progress Notes (Signed)
Subjective:    Patient ID: Alan Mendoza, male    DOB: 11/14/1965, 51 y.o.   MRN: [id number]  HPI   Patient presents for yearly follow up examination. 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  All immunizations and health maintenance protocols were reviewed with the patient and needed orders were placed.  Appropriate screening laboratory values were ordered for the patient including screening of hyperlipidemia, renal function and hepatic function. If indicated by BPH, a PSA was ordered.  Medication reconciliation,  past medical history, social history, problem list and allergies were reviewed in detail with the patient  Goals were established with regard to weight loss, exercise, and  diet in compliance with medications  He is no up to date on his colonoscopy, order was placed last year and he never scheduled. I will reschedule this today   His diabetes has been diet controlled. He has not been checking his blood sugars at home.   He has been on Lipitor in the past for hyperlipidemia, s/p stroke and MI. He has stopped taking this mediation. He has not been to Cardiology for his yearly follow up.   His only acute complaint is that of a rash on his buttocks and hips. He feels as though it is from an allergic reaction to chemicals at work.   He also reports that he has been having intermittent episodes of SOB. Per patient this only happens when he is in the kitchen cooking.   He was prescribed Chantix during his last physical and took it. He was able to quit smoking for multiple months but has started smoking again over the last 3-4 months. He does not want to go on Chantix again   Review of Systems  Constitutional: Negative.   HENT: Positive for hearing loss.   Eyes: Negative.     Respiratory: Positive for shortness of breath.   Cardiovascular: Negative.   Gastrointestinal: Negative.   Endocrine: Negative.   Genitourinary: Negative.   Musculoskeletal: Negative.   Skin: Positive for rash (buttocks ).  Allergic/Immunologic: Negative.   Neurological: Negative.   Hematological: Negative.   Psychiatric/Behavioral: Negative.   All other systems reviewed and are negative.  Past Medical History:  Diagnosis Date  . Arthritis   . Coronary artery disease   . 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 Diabetes mellitus without complication ([place])    not on meds cks blood sugar weekly  . Myocardial infarction    2014  . Stroke [facility])    denies    Social History   Social History  . Marital status: Single    Spouse name: N/A  . Number of children: N/A  . Years of education: N/A   Occupational History  . Not on file.   Social History Main Topics  . Smoking status: Light Tobacco Smoker    Packs/day: 0.50    Years: 33.00    Types: Cigarettes  . Smokeless tobacco: Never Used  . Alcohol use No  . Drug use: No  . Sexual activity: Not on file   Other Topics Concern  . 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  Past Surgical History:  Procedure Laterality Date  . CARDIAC CATHETERIZATION  11/14   stent placement  . EXAM UNDER ANESTHESIA WITH MANIPULATION OF KNEE Left 04/21/2014   Procedure: EXAM UNDER ANESTHESIA WITH MANIPULATION OF KNEE;  Surgeon: Meredith Pel, MD;  Location: [place];  Service: Orthopedics;  Laterality: Left;  LEFT KNEE MANIPULATION UNDER ANESTHESIA  . KNEE ARTHROSCOPY Left 1984  . NECK SURGERY  10/14  . TOTAL KNEE ARTHROPLASTY Left 03/17/2014   Procedure: TOTAL KNEE ARTHROPLASTY;  Surgeon: Meredith Pel, MD;  Location: [place];  Service: Orthopedics;  Laterality: Left;    Family History  Problem Relation Age of Onset  . Diabetes  Mother   . Osteoarthritis Mother   . Hypertension Mother   . Alcoholism Unknown   . Breast cancer Unknown   . Hypertension Unknown   . Kidney disease Unknown     No Known Allergies  Current Outpatient Prescriptions on File Prior to Visit  Medication Sig Dispense Refill  . amitriptyline (ELAVIL) 25 MG tablet Take 25 mg by mouth at bedtime as needed for sleep.    Marland Kitchen aspirin EC 81 MG tablet Take 1 tablet (81 mg total) by mouth daily. 90 tablet 3  . atorvastatin (LIPITOR) 40 MG tablet Take 1 tablet (40 mg total) by mouth daily at 6 PM. 31 tablet 11  . buPROPion (WELLBUTRIN SR) 150 MG 12 hr tablet Take 1 tablet (150 mg total) by mouth 2 (two) times daily. 30 tablet 6  . methylPREDNISolone (MEDROL DOSEPAK) 4 MG TBPK tablet Take as directed 21 tablet 0  . nitroGLYCERIN (NITROSTAT) 0.4 MG SL tablet Place 1 tablet (0.4 mg total) under the tongue every 5 (five) minutes as needed for chest pain. 25 tablet 6  . tadalafil (CIALIS) 20 MG tablet Take 0.5-1 tablets (10-20 mg total) by mouth every other day as needed for erectile dysfunction. 5 tablet 11  . varenicline (CHANTIX CONTINUING MONTH PAK) 1 MG tablet Take 1 tablet (1 mg total) by mouth 2 (two) times daily. 60 tablet 0  . varenicline (CHANTIX STARTING MONTH PAK) 0.5 MG X 11 & 1 MG X 42 tablet Take one 0.5 mg tablet by mouth once daily for 3 days, then increase to one 0.5 mg tablet twice daily for 4 days, then increase to one 1 mg tablet twice daily. 53 tablet 0   No current facility-administered medications on file prior to visit.     There were no vitals taken for this visit.      Objective:   Physical Exam  Constitutional: He is oriented to person, place, and time. He appears well-developed and well-nourished. No distress.  HENT:  Head: Normocephalic and atraumatic.  Right Ear: External ear normal.  Left Ear: External ear normal.  Nose: Nose normal.  Mouth/Throat: Oropharynx is clear and moist. No oropharyngeal exudate.  Eyes:  Conjunctivae and EOM are normal. Pupils are equal, round, and reactive to light. Right eye exhibits no discharge. Left eye exhibits no discharge. No scleral icterus.  Neck: Normal range of motion. Neck supple. No JVD present. No tracheal deviation present. No thyromegaly present.  Cardiovascular: Normal rate, regular rhythm, normal heart sounds and intact distal pulses.  Exam reveals no gallop and no friction rub.   No murmur heard. Pulmonary/Chest: Effort normal and breath sounds normal. No stridor. No respiratory distress. He has no wheezes. He has no rales. He exhibits no tenderness.  Abdominal: Soft. Bowel sounds are normal. He exhibits no distension and no mass. There  is no tenderness. There is no rebound and no guarding.  Musculoskeletal: Normal range of motion. He exhibits no edema, tenderness or deformity.  Lymphadenopathy:    He has no cervical adenopathy.  Neurological: He is alert and oriented to person, place, and time. He has normal reflexes. No cranial nerve deficit. He exhibits normal muscle tone. Coordination normal.  Skin: Skin is warm and dry. Rash (red rash on buttocks and hip. ) noted. He is not diaphoretic. No erythema. No pallor.  Psychiatric: He has a normal mood and affect. His behavior is normal. Judgment and thought content normal.  Nursing note and vitals reviewed.     Assessment & Plan:  1. Diabetes mellitus without complication ([place]) - Consider adding metformin  - Basic metabolic panel - CBC with Differential/Platelet - Hemoglobin A1c - Hepatic function panel - Lipid panel - PSA - TSH - Microalbumin / creatinine urine ratio  2. Mixed hyperlipidemia - Restart lipitor - Work on diabetic diet and exercise - Basic metabolic panel - CBC with Differential/Platelet - Hemoglobin A1c - Hepatic function panel - Lipid panel - PSA - TSH - atorvastatin (LIPITOR) 40 MG tablet; Take 1 tablet (40 mg total) by mouth daily at 6 PM.  Dispense: 90 tablet; Refill: 3  3.  Coronary artery disease involving native coronary artery of native heart with angina pectoris ([place]) - Will restart lipitor.  - Follow up with Cardiology  - Basic metabolic panel - CBC with Differential/Platelet - Hemoglobin A1c - Hepatic function panel - Lipid panel - PSA - TSH - atorvastatin (LIPITOR) 40 MG tablet; Take 1 tablet (40 mg total) by mouth daily at 6 PM.  Dispense: 90 tablet; Refill: 3  4. Tobacco use disorder - Possibly starting with some degree of COPD.  - He was encouraged to quit smoking.  - albuterol (PROVENTIL HFA;VENTOLIN HFA) 108 (90 Base) MCG/ACT inhaler; Inhale 2 puffs into the lungs every 6 (six) hours as needed for wheezing or shortness of breath.  Dispense: 1 Inhaler; Refill: 2  5. Need for hepatitis C screening test  - Hep C Antibody  6. Colon cancer screening  - Ambulatory referral to Gastroenterology  7. Benign prostatic hyperplasia without lower urinary tract symptoms  - PSA  Dorothyann Peng, NP

## 2016-06-14 NOTE — Addendum Note (Signed)
Addended by: Apolinar Junes on: 06/14/2016 03:37 PM   Modules accepted: Orders

## 2016-06-15 ENCOUNTER — Telehealth: Payer: Self-pay | Admitting: Adult Health

## 2016-06-15 ENCOUNTER — Other Ambulatory Visit: Payer: Self-pay

## 2016-06-15 DIAGNOSIS — I25119 Atherosclerotic heart disease of native coronary artery with unspecified angina pectoris: Secondary | ICD-10-CM

## 2016-06-15 DIAGNOSIS — E782 Mixed hyperlipidemia: Secondary | ICD-10-CM

## 2016-06-15 LAB — HEPATITIS C ANTIBODY: HCV Ab: NEGATIVE

## 2016-06-15 MED ORDER — METFORMIN HCL 500 MG PO TABS: 500.0000 mg | ORAL_TABLET | Freq: Every day | ORAL | 3 refills | Status: DC

## 2016-06-15 MED ORDER — ATORVASTATIN CALCIUM 40 MG PO TABS
40.0000 mg | ORAL_TABLET | Freq: Every day | ORAL | 3 refills | Status: DC
Start: 2016-06-15 — End: 2017-03-05

## 2016-06-15 NOTE — Telephone Encounter (Signed)
Prescriptions have been sent into preferred pharmacy. Thanks!

## 2016-06-15 NOTE — Telephone Encounter (Signed)
Pt seen yesterday and his meds were sent to the wrong pharmacy. Can you resend to [place], [place].  Pt states please make this pt's primary pharmacy.  Pt needs atorvastatin (LIPITOR) 40 MG tablet metFORMIN (GLUCOPHAGE) 500 MG tablet

## 2016-06-30 ENCOUNTER — Encounter: Payer: Self-pay | Admitting: Gastroenterology

## 2016-07-11 ENCOUNTER — Encounter: Payer: Self-pay | Admitting: Physician Assistant

## 2016-07-11 NOTE — Progress Notes (Signed)
Cardiology Office Note    Date:  07/12/2016   ID:  Alan Mendoza, DOB Nov 18, 1965, MRN [id number]  PCP:  Dorothyann Peng, NP  Cardiologist:  Dr. Acie Fredrickson  Chief Complaint: yearly follow up for CAD  History of Present Illness:   Alan Mendoza is a 51 y.o. male CAD, DM, CVA, deafness, HLD and pulmonary nodules presents for follow up.   Had a stent place in [place], [place] in 2014 (ECU) - [facility]. Last seen 11/03/14 by Dr. Acie Fredrickson. Recently started on statin when seen by PCP 06/14/2016 for routine follow up.   Here today for follow up with sign language interpreter. Not taking ASA. Takes Lipitor PRN. No DOE or angina. He denies orthopnea, PND, syncope, palpitation, dizziness, lower extremity edema or melena. He currently smokes half a pack a day. He does have shortness of breath when he walks up in morning. Relieves with albuterol inhaler.    Past Medical History:  Diagnosis Date  . Arthritis   . Coronary artery disease   . 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 Diabetes mellitus without complication ([place])    not on meds cks blood sugar weekly  . Emphysema lung ([place]) 03/2014   on CT of chest  . HLD (hyperlipidemia)   . Myocardial infarction ([place])    2014  . Pulmonary nodule 03/2014   on CT of chest  . Stroke [facility])    denies  . Tobacco abuse     Past Surgical History:  Procedure Laterality Date  . CARDIAC CATHETERIZATION  11/14   stent placement  . EXAM UNDER ANESTHESIA WITH MANIPULATION OF KNEE Left 04/21/2014   Procedure: EXAM UNDER ANESTHESIA WITH MANIPULATION OF KNEE;  Surgeon: Meredith Pel, MD;  Location: [place];  Service: Orthopedics;  Laterality: Left;  LEFT KNEE MANIPULATION UNDER ANESTHESIA  . KNEE ARTHROSCOPY Left 1984  . NECK SURGERY  10/14  . TOTAL KNEE ARTHROPLASTY Left 03/17/2014   Procedure: TOTAL KNEE ARTHROPLASTY;  Surgeon: Meredith Pel, MD;  Location: [place];  Service: Orthopedics;  Laterality: Left;      Current Medications: Prior to Admission medications   Medication Sig Start Date End Date Taking? Authorizing Provider  albuterol (PROVENTIL HFA;VENTOLIN HFA) 108 (90 Base) MCG/ACT inhaler Inhale 2 puffs into the lungs every 6 (six) hours as needed for wheezing or shortness of breath. 06/14/16   Nafziger, Tommi Rumps, NP  atorvastatin (LIPITOR) 40 MG tablet Take 1 tablet (40 mg total) by mouth daily at 6 PM. 06/15/16   Nafziger, Tommi Rumps, NP  metFORMIN (GLUCOPHAGE) 500 MG tablet Take 1 tablet (500 mg total) by mouth daily with breakfast. 06/15/16   Nafziger, Tommi Rumps, NP  triamcinolone ointment (KENALOG) 0.5 % Apply 1 application topically 2 (two) times daily. 06/14/16   Dorothyann Peng, NP    Allergies:   Patient has no known allergies.   Social History   Social History  . Marital status: Single    Spouse name: N/A  . Number of children: N/A  . Years of education: N/A   Social History Main Topics  . Smoking status: Light Tobacco Smoker    Packs/day: 0.50    Years: 33.00    Types: Cigarettes  . Smokeless tobacco: Never Used  . Alcohol use No  . Drug use: No  . Sexual activity: Not Asked   Other Topics Concern  . None   Social History Narrative   Does not work  Likes to be outside and church.         Family History:  The patient's family history includes Diabetes in his mother; Hypertension in his mother; Osteoarthritis in his mother.   ROS:   Please see the history of present illness.    ROS All other systems reviewed and are negative.   PHYSICAL EXAM:   VS:  BP 100/64   Pulse (!) 101   Ht 5\' 7"  (1.702 m)   Wt 150 lb (68 kg)   SpO2 94%   BMI 23.49 kg/m    GEN: Well nourished, well developed, in no acute distress  HEENT: normal  Neck: no JVD, carotid bruits, or masses Cardiac: RRR; no murmurs, rubs, or gallops,no edema  Respiratory:  clear to auscultation bilaterally, normal work of breathing GI: soft, nontender, nondistended, + BS MS: no deformity or atrophy  Skin: warm  and dry, no rash Neuro:  Alert and Oriented x 3, Strength and sensation are intact Psych: euthymic mood, full affect  Wt Readings from Last 3 Encounters:  07/12/16 150 lb (68 kg)  06/14/16 153 lb 9.6 oz (69.7 kg)  11/24/14 152 lb 14.4 oz (69.4 kg)      Studies/Labs Reviewed:   EKG:  EKG is ordered today.  The ekg ordered today demonstrates SR at rate of 88 bpm. No acute changes.   Recent Labs: 06/14/2016: ALT 12; BUN 14; Creatinine, Ser 0.93; Hemoglobin 16.3; Platelets 283.0; Potassium 4.4; Sodium 142; TSH 1.22   Lipid Panel    Component Value Date/Time   CHOL 215 (H) 06/14/2016 1003   TRIG 183.0 (H) 06/14/2016 1003   HDL 35.30 (L) 06/14/2016 1003   CHOLHDL 6 06/14/2016 1003   VLDL 36.6 06/14/2016 1003   LDLCALC 143 (H) 06/14/2016 1003    Additional studies/ records that were reviewed today include:   As above    ASSESSMENT & PLAN:    1. CAD s/p stenting in 2014 (no further information available) - No angina. Start aspirin 81 mg daily. Advised to take Lipitor regularly.  2. HLD - 06/14/2016: Cholesterol 215; HDL 35.30; LDL Cholesterol 143; Triglycerides 183.0; VLDL 36.6  - Statin followed by PCP. LDL goal less than 70.  3.DM - Per primary   4. History of emphysema and pulmonary nodules on CT of chest 03/2014 - He has shortness of breath after he walks up. Relieves with albuterol nebulizer. Further workup per primary. No hx of asthma.   5. Tobacco abuse - Advised cessation.    Medication Adjustments/Labs and Tests Ordered: Current medicines are reviewed at length with the patient today.  Concerns regarding medicines are outlined above.  Medication changes, Labs and Tests ordered today are listed in the Patient Instructions below. Patient Instructions  Your physician has recommended you make the following change in your medication:  START  ASPIRIN 81 MG EVERY DAY  AND  NEED  TO  TAKE  ATORVASTATIN  EVERY DAY NOT  AS  NEEDED  Your physician wants you to  follow-up in: [place] will receive a reminder letter in the mail two months in advance. If you don't receive a letter, please call our office to schedule the follow-up appointment.     Jarrett Soho, [place]  07/12/2016 11:26 AM    [facility] [place], [place], [place]  [address] Phone: [phone]; Fax: [phone]

## 2016-07-12 ENCOUNTER — Ambulatory Visit (INDEPENDENT_AMBULATORY_CARE_PROVIDER_SITE_OTHER): Payer: Medicare Other | Admitting: Physician Assistant

## 2016-07-12 ENCOUNTER — Encounter: Payer: Self-pay | Admitting: Physician Assistant

## 2016-07-12 VITALS — BP 100/64 | HR 101 | Ht 67.0 in | Wt 150.0 lb

## 2016-07-12 DIAGNOSIS — J439 Emphysema, unspecified: Secondary | ICD-10-CM | POA: Diagnosis not present

## 2016-07-12 DIAGNOSIS — I2510 Atherosclerotic heart disease of native coronary artery without angina pectoris: Secondary | ICD-10-CM | POA: Diagnosis not present

## 2016-07-12 DIAGNOSIS — Z720 Tobacco use: Secondary | ICD-10-CM

## 2016-07-12 DIAGNOSIS — E785 Hyperlipidemia, unspecified: Secondary | ICD-10-CM

## 2016-07-12 DIAGNOSIS — E119 Type 2 diabetes mellitus without complications: Secondary | ICD-10-CM

## 2016-07-12 MED ORDER — ASPIRIN EC 81 MG PO TBEC: 81.0000 mg | DELAYED_RELEASE_TABLET | Freq: Every day | ORAL | 3 refills | Status: DC

## 2016-07-12 NOTE — Patient Instructions (Signed)
Your physician has recommended you make the following change in your medication:  START  ASPIRIN 81 MG EVERY DAY  AND  NEED  TO  TAKE  ATORVASTATIN  EVERY DAY NOT  AS  NEEDED  Your physician wants you to follow-up in: [place] will receive a reminder letter in the mail two months in advance. If you don't receive a letter, please call our office to schedule the follow-up appointment.

## 2016-08-17 ENCOUNTER — Encounter (INDEPENDENT_AMBULATORY_CARE_PROVIDER_SITE_OTHER): Payer: Self-pay

## 2016-08-17 ENCOUNTER — Ambulatory Visit (AMBULATORY_SURGERY_CENTER): Payer: Self-pay

## 2016-08-17 VITALS — Ht 67.0 in | Wt 146.4 lb

## 2016-08-17 DIAGNOSIS — Z1211 Encounter for screening for malignant neoplasm of colon: Secondary | ICD-10-CM

## 2016-08-17 MED ORDER — NA SULFATE-K SULFATE-MG SULF 17.5-3.13-1.6 GM/177ML PO SOLN: 1.0000 | Freq: Once | ORAL | 0 refills | Status: AC

## 2016-08-17 NOTE — Progress Notes (Signed)
Denies allergies to eggs or soy products. Denies complication of anesthesia or sedation. Denies use of weight loss medication. Denies use of O2.   Emmi instructions declined.  

## 2016-08-17 NOTE — Progress Notes (Signed)
Pre-visit was conducted with an interpreter Carlisle Cater. Due to patients deafness.   Riki Sheer, LPN

## 2016-08-31 ENCOUNTER — Ambulatory Visit (AMBULATORY_SURGERY_CENTER): Payer: Medicare Other | Admitting: Gastroenterology

## 2016-08-31 ENCOUNTER — Encounter: Payer: Self-pay | Admitting: Gastroenterology

## 2016-08-31 VITALS — BP 109/62 | HR 68 | Temp 98.4°F | Resp 17 | Ht 67.0 in | Wt 146.0 lb

## 2016-08-31 DIAGNOSIS — Z1211 Encounter for screening for malignant neoplasm of colon: Secondary | ICD-10-CM

## 2016-08-31 DIAGNOSIS — Z1212 Encounter for screening for malignant neoplasm of rectum: Secondary | ICD-10-CM | POA: Diagnosis not present

## 2016-08-31 DIAGNOSIS — Z538 Procedure and treatment not carried out for other reasons: Secondary | ICD-10-CM

## 2016-08-31 MED ORDER — SODIUM CHLORIDE 0.9 % IV SOLN: 500.0000 mL | INTRAVENOUS | Status: DC

## 2016-08-31 NOTE — Patient Instructions (Signed)
YOU HAD AN ENDOSCOPIC PROCEDURE TODAY AT [facility]:   Refer to the procedure report that was given to you for any specific questions about what was found during the examination.  If the procedure report does not answer your questions, please call your gastroenterologist to clarify.  If you requested that your care partner not be given the details of your procedure findings, then the procedure report has been included in a sealed envelope for you to review at your convenience later.  YOU SHOULD EXPECT: Some feelings of bloating in the abdomen. Passage of more gas than usual.  Walking can help get rid of the air that was put into your GI tract during the procedure and reduce the bloating. If you had a lower endoscopy (such as a colonoscopy or flexible sigmoidoscopy) you may notice spotting of blood in your stool or on the toilet paper. If you underwent a bowel prep for your procedure, you may not have a normal bowel movement for a few days.  Please Note:  You might notice some irritation and congestion in your nose or some drainage.  This is from the oxygen used during your procedure.  There is no need for concern and it should clear up in a day or so.  SYMPTOMS TO REPORT IMMEDIATELY:   Following lower endoscopy (colonoscopy or flexible sigmoidoscopy):  Excessive amounts of blood in the stool  Significant tenderness or worsening of abdominal pains  Swelling of the abdomen that is new, acute  Fever of 100F or higher  For urgent or emergent issues, a gastroenterologist can be reached at any hour by calling [phone].   DIET:  We do recommend a small meal at first, but then you may proceed to your regular diet.  Drink plenty of fluids but you should avoid alcoholic beverages for 24 hours.  ACTIVITY:  You should plan to take it easy for the rest of today and you should NOT DRIVE or use heavy machinery until tomorrow (because of the sedation medicines used during the test).     FOLLOW UP: Our staff will call the number listed on your records the next business day following your procedure to check on you and address any questions or concerns that you may have regarding the information given to you following your procedure. If we do not reach you, we will leave a message.  However, if you are feeling well and you are not experiencing any problems, there is no need to return our call.  We will assume that you have returned to your regular daily activities without incident.  If any biopsies were taken you will be contacted by phone or by letter within the next 1-3 weeks.  Please call us at [phone] if you have not heard about the biopsies in 3 weeks.   Reschedule for a Colonoscopy clear liquid diet at least 24 hours prior to the procedure. Colonoscopy reschedule for October   SIGNATURES/CONFIDENTIALITY: You and/or your care partner have signed paperwork which will be entered into your electronic medical record.  These signatures attest to the fact that that the information above on your After Visit Summary has been reviewed and is understood.  Full responsibility of the confidentiality of this discharge information lies with you and/or your care-partner.

## 2016-08-31 NOTE — Progress Notes (Signed)
To PACU, VSS. Report to [email or web address] 

## 2016-08-31 NOTE — Op Note (Signed)
[facility] Patient Name: Marquies Wanat Procedure Date: 08/31/2016 10:10 AM MRN: [id number] Endoscopist: Remo Lipps P. Armbruster MD, MD Age: 51 Referring MD:  Date of Birth: April 22, 1965 Gender: Male Account #: [id number] Procedure:                Colonoscopy Indications:              Screening for colorectal malignant neoplasm, This                            is the patient's first colonoscopy Medicines:                Monitored Anesthesia Care Procedure:                Pre-Anesthesia Assessment:                           - Prior to the procedure, a History and Physical                            was performed, and patient medications and                            allergies were reviewed. The patient's tolerance of                            previous anesthesia was also reviewed. The risks                            and benefits of the procedure and the sedation                            options and risks were discussed with the patient.                            All questions were answered, and informed consent                            was obtained. Prior Anticoagulants: The patient has                            taken no previous anticoagulant or antiplatelet                            agents. ASA Grade Assessment: III - A patient with                            severe systemic disease. After reviewing the risks                            and benefits, the patient was deemed in                            satisfactory condition to undergo the procedure.  After obtaining informed consent, the colonoscope                            was passed under direct vision. Throughout the                            procedure, the patient's blood pressure, pulse, and                            oxygen saturations were monitored continuously. The                            Colonoscope was introduced through the anus and                            advanced to the  the cecum, identified by                            appendiceal orifice and ileocecal valve. The                            colonoscopy was performed without difficulty. The                            patient tolerated the procedure well. The quality                            of the bowel preparation was inadequate. The                            ileocecal valve and the rectum were photographed. Scope In: 10:12:55 AM Scope Out: 10:22:33 AM Scope Withdrawal Time: 0 hours 5 minutes 1 second  Total Procedure Duration: 0 hours 9 minutes 38 seconds  Findings:                 The perianal exam findings include non-thrombosed                            external hemorrhoids.                           A large amount of semi-solid stool was found in the                            entire colon, interfering with visualization. The                            exam was not adequate for screening purposes.                           Internal hemorrhoids were found during                            retroflexion. The hemorrhoids were moderate.  No mass lesions and obvious polyps, however the                            exam was inadequate for screening purposes. Complications:            No immediate complications. Estimated blood loss:                            None. Estimated Blood Loss:     Estimated blood loss: none. Impression:               - Preparation of the colon was inadequate for                            screening purposes.                           - Non-thrombosed external hemorrhoids found on                            perianal exam.                           - Stool in the entire examined colon.                           - Internal hemorrhoids.                           I suspect bowel prep was poor due to eating regular                            diet day prior to the procedure. Recommendation:           - Patient has a contact number available for                             emergencies. The signs and symptoms of potential                            delayed complications were discussed with the                            patient. Return to normal activities tomorrow.                            Written discharge instructions were provided to the                            patient.                           - Resume previous diet.                           - Continue present medications.                           -  Repeat colonoscopy because the bowel preparation                            was suboptimal. Please schedule at your                            convenience. Clear liquid diet at least 24 hours                            prior to the procedure. Remo Lipps P. Armbruster MD, MD 08/31/2016 10:27:53 AM This report has been signed electronically.

## 2016-09-01 ENCOUNTER — Telehealth: Payer: Self-pay | Admitting: *Deleted

## 2016-09-01 NOTE — Telephone Encounter (Signed)
Message left

## 2016-09-01 NOTE — Telephone Encounter (Signed)
No answer, message left for the patient. 

## 2016-10-05 ENCOUNTER — Telehealth: Payer: Self-pay | Admitting: *Deleted

## 2016-10-05 NOTE — Telephone Encounter (Signed)
Patient no-showed for his Pre-Visit appointment.  Called patient using Hearing Impaired Interpreter [phone].  Rescheduled Pre-Visit appointment for Tuesday, 10/10/16 at 11 am.

## 2016-10-11 ENCOUNTER — Telehealth: Payer: Self-pay | Admitting: *Deleted

## 2016-10-11 NOTE — Telephone Encounter (Signed)
Phone call to patient regarding missed appointment 10/09/16. Left message that PV will need to be rescheduled by 5pm today or colonoscopy scheduled 10/17/16 will be cancelled and both PV and colonoscopy will have to be rescheduled for a later date.

## 2016-10-11 NOTE — Telephone Encounter (Signed)
Attempted to reach patient again, no answer. Message left that colonoscopy scheduled 10/17/16 is now cancelled and if he desires to complete a colonoscopy he will need to reschedule along with a PV

## 2016-10-17 ENCOUNTER — Encounter: Payer: Medicare Other | Admitting: Gastroenterology

## 2017-03-05 ENCOUNTER — Encounter (HOSPITAL_COMMUNITY): Payer: Self-pay | Admitting: Emergency Medicine

## 2017-03-05 ENCOUNTER — Ambulatory Visit (HOSPITAL_COMMUNITY)
Admission: EM | Admit: 2017-03-05 | Discharge: 2017-03-05 | Disposition: A | Discharge status: Home / Self Care | Payer: Medicare Other | Attending: Emergency Medicine | Admitting: Emergency Medicine

## 2017-03-05 ENCOUNTER — Ambulatory Visit (INDEPENDENT_AMBULATORY_CARE_PROVIDER_SITE_OTHER): Payer: Medicare Other

## 2017-03-05 DIAGNOSIS — J441 Chronic obstructive pulmonary disease with (acute) exacerbation: Secondary | ICD-10-CM

## 2017-03-05 DIAGNOSIS — I25119 Atherosclerotic heart disease of native coronary artery with unspecified angina pectoris: Secondary | ICD-10-CM | POA: Diagnosis not present

## 2017-03-05 DIAGNOSIS — Z760 Encounter for issue of repeat prescription: Secondary | ICD-10-CM

## 2017-03-05 DIAGNOSIS — E782 Mixed hyperlipidemia: Secondary | ICD-10-CM

## 2017-03-05 DIAGNOSIS — R51 Headache: Secondary | ICD-10-CM | POA: Diagnosis not present

## 2017-03-05 DIAGNOSIS — M791 Myalgia, unspecified site: Secondary | ICD-10-CM | POA: Diagnosis not present

## 2017-03-05 DIAGNOSIS — R05 Cough: Secondary | ICD-10-CM | POA: Diagnosis not present

## 2017-03-05 DIAGNOSIS — R079 Chest pain, unspecified: Secondary | ICD-10-CM | POA: Diagnosis not present

## 2017-03-05 MED ORDER — METFORMIN HCL 500 MG PO TABS: 500.0000 mg | ORAL_TABLET | Freq: Every day | ORAL | 0 refills | Status: DC

## 2017-03-05 MED ORDER — AEROCHAMBER PLUS FLO-VU LARGE MISC
Status: AC
  Filled 2017-03-05: qty 1

## 2017-03-05 MED ORDER — PREDNISONE 20 MG PO TABS: 40.0000 mg | ORAL_TABLET | Freq: Every day | ORAL | 0 refills | Status: AC

## 2017-03-05 MED ORDER — ALBUTEROL SULFATE HFA 108 (90 BASE) MCG/ACT IN AERS
INHALATION_SPRAY | RESPIRATORY_TRACT | Status: AC
  Filled 2017-03-05: qty 6.7

## 2017-03-05 MED ORDER — AEROCHAMBER PLUS MISC: 2 refills | Status: DC

## 2017-03-05 MED ORDER — ATORVASTATIN CALCIUM 40 MG PO TABS: 40.0000 mg | ORAL_TABLET | Freq: Every day | ORAL | 0 refills | Status: DC

## 2017-03-05 MED ORDER — ALBUTEROL SULFATE HFA 108 (90 BASE) MCG/ACT IN AERS
2.0000 | INHALATION_SPRAY | Freq: Once | RESPIRATORY_TRACT | Status: AC
  Administered 2017-03-05: 2 via RESPIRATORY_TRACT

## 2017-03-05 NOTE — ED Provider Notes (Signed)
HPI  SUBJECTIVE:  Alan Mendoza is a 52 y.o. male who presents with cough, headache with coughing, body aches described as soreness located primarily in his chest and abdomen for the past week.  He denies fevers, other chest pain.  Some shortness of breat, Dyspnea on exertion.  No wheeze.  No recent URI.  No preceding flulike symptoms.  States that he was in his otherwise usual state of health up until the past week.  No antibiotics in the past 3 months, antipyretic in the past 6-8 hours.  He tried NyQuil, Robitussin.  He is not currently on any inhalers.  No aggravating or alleviating factors.  He did not get a flu shot this year.  States that he is sleeping okay at night without waking up coughing.  Per chart review he has a history of COPD, but patient denies this diagnosis.  He has also been smoking of 1-2 packs/day for the past 33-4 years.  He is hearing impaired, has a history of diabetes, coronary artery disease, MI, status post stent, hypercholesterolemia.  Per chart, patient has also had a stroke, but patient denies this.  No hypertension. PMD. Dorothyann Peng, NP   All history obtained through ASL language line.   Past Medical History:  Diagnosis Date  . Arthritis   . Coronary artery disease   . 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 Diabetes mellitus without complication ([place])    not on meds cks blood sugar weekly  . Emphysema lung ([place]) 03/2014   on CT of chest  . HLD (hyperlipidemia)   . Myocardial infarction ([place])    2014  . Pulmonary nodule 03/2014   on CT of chest  . Stroke [facility])    denies  . Tobacco abuse     Past Surgical History:  Procedure Laterality Date  . CARDIAC CATHETERIZATION  11/14   stent placement  . EXAM UNDER ANESTHESIA WITH MANIPULATION OF KNEE Left 04/21/2014   Procedure: EXAM UNDER ANESTHESIA WITH MANIPULATION OF KNEE;  Surgeon: Meredith Pel, MD;  Location: [facility];  Service: Orthopedics;  Laterality:  Left;  LEFT KNEE MANIPULATION UNDER ANESTHESIA  . KNEE ARTHROSCOPY Left 1984  . NECK SURGERY  10/14  . TOTAL KNEE ARTHROPLASTY Left 03/17/2014   Procedure: TOTAL KNEE ARTHROPLASTY;  Surgeon: Meredith Pel, MD;  Location: [facility];  Service: Orthopedics;  Laterality: Left;    Family History  Problem Relation Age of Onset  . Diabetes Mother   . Osteoarthritis Mother   . Hypertension Mother   . Alcoholism Unknown   . Breast cancer Unknown   . Hypertension Unknown   . Kidney disease Unknown   . Colon cancer Neg Hx   . Esophageal cancer Neg Hx   . Pancreatic cancer Neg Hx   . Rectal cancer Neg Hx   . Stomach cancer Neg Hx     Social History   Tobacco Use  . Smoking status: Light Tobacco Smoker    Packs/day: 0.50    Years: 33.00    Pack years: 16.50    Types: Cigarettes  . Smokeless tobacco: Never Used  Substance Use Topics  . Alcohol use: No    Alcohol/week: 0.0 oz  . Drug use: No     Current Facility-Administered Medications:  .  0.9 %  sodium chloride infusion, 500 mL, Intravenous, Continuous, Armbruster, Carlota Raspberry, MD .  albuterol (PROVENTIL HFA;VENTOLIN HFA) 108 (90 Base) MCG/ACT inhaler 2  puff, 2 puff, Inhalation, Once, Melynda Ripple, MD  Current Outpatient Medications:  .  albuterol (PROVENTIL HFA;VENTOLIN HFA) 108 (90 Base) MCG/ACT inhaler, Inhale 2 puffs into the lungs every 6 (six) hours as needed for wheezing or shortness of breath., Disp: 1 Inhaler, Rfl: 2 .  atorvastatin (LIPITOR) 40 MG tablet, Take 1 tablet (40 mg total) by mouth daily at 6 PM., Disp: 90 tablet, Rfl: 3 .  metFORMIN (GLUCOPHAGE) 500 MG tablet, Take 1 tablet (500 mg total) by mouth daily with breakfast., Disp: 180 tablet, Rfl: 3 .  triamcinolone ointment (KENALOG) 0.5 %, Apply 1 application topically 2 (two) times daily., Disp: 30 g, Rfl: 2  No Known Allergies   ROS  As noted in HPI.   Physical Exam  BP (!) 141/83 (BP Location: Right Arm)   Pulse 96   Temp 97.6 F (36.4 C) (Oral)    Resp 18   SpO2 96%   Constitutional: Well developed, well nourished, no acute distress Eyes: PERRL, EOMI, conjunctiva normal bilaterally HENT: Normocephalic, atraumatic,mucus membranes moist Respiratory: Clear to auscultation bilaterally, no rales, no wheezing, no rhonchi Cardiovascular: Normal rate and rhythm, no murmurs, no gallops, no rubs GI: Soft, nondistended, normal bowel sounds, nontender, no rebound, no guarding Back: no CVAT skin: No rash, skin intact Musculoskeletal: No edema, no tenderness, no deformities Neurologic: Alert & oriented x 3, CN II-XII grossly intact, no motor deficits, sensation grossly intact Psychiatric: Speech and behavior appropriate   ED Course   Medications  albuterol (PROVENTIL HFA;VENTOLIN HFA) 108 (90 Base) MCG/ACT inhaler 2 puff (not administered)    Orders Placed This Encounter  Procedures  . DG Chest 2 View    Standing Status:   Standing    Number of Occurrences:   1    Order Specific Question:   Reason for Exam (SYMPTOM  OR DIAGNOSIS REQUIRED)    Answer:   cough   No results found for this or any previous visit (from the past 24 hour(s)). Dg Chest 2 View  Result Date: 03/05/2017 CLINICAL DATA:  Cough and chest pain for the past 2-3 weeks. EXAM: CHEST  2 VIEW COMPARISON:  Chest x-ray dated March 05, 2014. FINDINGS: The patient is slightly rotated to the right. The heart size and mediastinal contours are within normal limits. Normal pulmonary vascularity. The lungs remain mildly hyperinflated with emphysematous changes. Unchanged bilateral nipple shadows. No focal consolidation, pleural effusion, or pneumothorax. No acute osseous abnormality. IMPRESSION: COPD.  No active cardiopulmonary disease. Electronically Signed   By: Titus Dubin M.D.   On: 03/05/2017 11:40    ED Clinical Impression  COPD exacerbation (HCC)  Medication refill   ED Assessment/Plan  Presentation consistent with a COPD exacerbation.  Giving patient albuterol  inhaler here.  Will prescribe a spacer, 40 mg of prednisone for 5 days, withholding antibiotics at this point time in the absence of fevers, focal lung findings, negative chest x-ray.  Also refilling his Lipitor and metformin.  Patient states that he has not been taking his medications and some time because he "cannot afford them".  States that he sometimes cannot afford to eat 3 times a day.  Will print out his prescription so that he can [facility] them [facility] and get his prescriptions for free.  Called case management to see if they could offer assistance, however, there was no answer or voicemail available. Also sent a note to his PCP re: today's visit and inabilty to afford medications.  Meds ordered this encounter  Medications  . albuterol (PROVENTIL HFA;VENTOLIN HFA) 108 (90 Base) MCG/ACT inhaler 2 puff    *This clinic note was created using Dragon dictation software. Therefore, there may be occasional mistakes despite careful proofreading.  ?   Melynda Ripple, MD 03/07/17 1433

## 2017-03-05 NOTE — Discharge Instructions (Signed)
I am printing out your prescription so that you can take them to community [facility] wellness center.  They may be able to fill all of your medicines for free.  I have also ordered a referral to case management, but they will be calling you to try and come up with a solution to help you for your medications.  2 puffs from your albuterol inhaler using your spacer every 4-6 hours.  Back off on this as you start to feel better.  Call your doctor and get an appointment as soon as possible.  Go to the ER for the signs and symptoms we discussed.

## 2017-03-05 NOTE — ED Triage Notes (Signed)
Pt here with cough and respiratory sx x 1 week; pt sts pain with cough and productive cough

## 2017-03-07 ENCOUNTER — Telehealth: Payer: Self-pay | Admitting: Family Medicine

## 2017-03-07 NOTE — Telephone Encounter (Signed)
-----   Message from Dorothyann Peng, NP sent at 03/07/2017  3:28 PM EST ----- Fran Lowes,   Thank you for seeing him and prescribing his medications. We will reach out to him. The community health clinic would be the best way to go about it for him.   We will touch base and make sure he knows that  Metformin is on the $4 list at [facility]  We can switch his statin to something else that is cheaper for him.   Thank you for letting me know   Tommi Rumps  ----- Message ----- From: Melynda Ripple, MD Sent: 03/07/2017   2:27 PM To: Dorothyann Peng, NP  Hello-  I saw Mr. Barstow at the [facility] urgent care center on Monday with what sounded to be like a COPD exacerbation which I treated with bronchodilators and steriods. I also refilled his metformin and lipitor- said that he hadn't taken them in some time because he could not afford to eat and also afford his medications. I sent him out with printed prescriptions, hoping that he could fill them for free at the [facility] and [facility]. I also tried contacting [facility] case management to see if they could offer assistance, but nobody was available. I wanted to let you know about his inability to afford his medications- do you know of any way that he can get them for free or at a significantly reduced price?   Thanks- Melynda Ripple, MD

## 2017-03-08 NOTE — Telephone Encounter (Signed)
Tried to reach the pt.  Unavailable.  Will try again at a later time.

## 2017-03-16 NOTE — Telephone Encounter (Signed)
Spoke to the pt.  He is no longer having trouble paying for his prescriptions.  Does not require assistance or prescription change at this time.

## 2017-04-04 ENCOUNTER — Telehealth: Payer: Self-pay | Admitting: Adult Health

## 2017-04-04 NOTE — Telephone Encounter (Signed)
Copied from [place] [id number]. Topic: Inquiry >> Apr 04, 2017 11:10 AM Cecelia Byars, NT wrote: Reason for CRM: Patient called and said the office called twice on 03/28/17 and did not leave a message , there is no CRM noted he would like a message left if someone calls again , thanks

## 2017-04-04 NOTE — Telephone Encounter (Signed)
I have not called the pt.

## 2017-07-18 NOTE — Progress Notes (Deleted)
Cardiology Office Note   Date:  07/18/2017   ID:  Alan Mendoza, DOB 07-12-1965, MRN [id number]  PCP:  Dorothyann Peng, NP  Cardiologist:  Dr. Acie Fredrickson .     No chief complaint on file.     History of Present Illness: Alan Mendoza is a 52 y.o. male who presents for ***  CAD, DM, CVA, deafness, HLD and pulmonary nodules presents for follow up.   Had a stent place in [place], [place] in 2014 (ECU) - [facility]. Last seen 11/03/14 by Dr. Acie Fredrickson. Recently started on statin when seen by PCP 06/14/2016 for routine follow up.   Here today for follow up with sign language interpreter. Not taking ASA. Takes Lipitor PRN. No DOE or angina. He denies orthopnea, PND, syncope, palpitation, dizziness, lower extremity edema or melena. He currently smokes half a pack a day. He does have shortness of breath when he walks up in morning. Relieves with albuterol inhaler.    Past Medical History:  Diagnosis Date  . Arthritis   . Coronary artery disease   . 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 Diabetes mellitus without complication ([place])    not on meds cks blood sugar weekly  . Emphysema lung ([place]) 03/2014   on CT of chest  . HLD (hyperlipidemia)   . Myocardial infarction ([place])    2014  . Pulmonary nodule 03/2014   on CT of chest  . Stroke [facility])    denies  . Tobacco abuse     Past Surgical History:  Procedure Laterality Date  . CARDIAC CATHETERIZATION  11/14   stent placement  . EXAM UNDER ANESTHESIA WITH MANIPULATION OF KNEE Left 04/21/2014   Procedure: EXAM UNDER ANESTHESIA WITH MANIPULATION OF KNEE;  Surgeon: Meredith Pel, MD;  Location: [facility];  Service: Orthopedics;  Laterality: Left;  LEFT KNEE MANIPULATION UNDER ANESTHESIA  . KNEE ARTHROSCOPY Left 1984  . NECK SURGERY  10/14  . TOTAL KNEE ARTHROPLASTY Left 03/17/2014   Procedure: TOTAL KNEE ARTHROPLASTY;  Surgeon: Meredith Pel, MD;  Location: [facility];  Service: Orthopedics;   Laterality: Left;     Current Outpatient Medications  Medication Sig Dispense Refill  . albuterol (PROVENTIL HFA;VENTOLIN HFA) 108 (90 Base) MCG/ACT inhaler Inhale 2 puffs into the lungs every 6 (six) hours as needed for wheezing or shortness of breath. 1 Inhaler 2  . atorvastatin (LIPITOR) 40 MG tablet Take 1 tablet (40 mg total) by mouth daily at 6 PM. 30 tablet 0  . metFORMIN (GLUCOPHAGE) 500 MG tablet Take 1 tablet (500 mg total) by mouth daily with breakfast. 30 tablet 0  . Spacer/Aero-Holding Chambers (AEROCHAMBER PLUS) inhaler Use as instructed 1 each 2   No current facility-administered medications for this visit.     Allergies:   Patient has no known allergies.    Social History:  The patient  reports that he has been smoking cigarettes.  He has a 16.50 pack-year smoking history. He has never used smokeless tobacco. He reports that he does not drink alcohol or use drugs.   Family History:  The patient's ***family history includes Alcoholism in his unknown relative; Breast cancer in his unknown relative; Diabetes in his mother; Hypertension in his mother and unknown relative; Kidney disease in his unknown relative; Osteoarthritis in his mother.    ROS:  General:no colds or fevers, no weight changes Skin:no rashes or ulcers HEENT:no blurred vision, no congestion CV:see HPI  PUL:see HPI GI:no diarrhea constipation or melena, no indigestion GU:no hematuria, no dysuria MS:no joint pain, no claudication Neuro:no syncope, no lightheadedness Endo:no diabetes, no thyroid disease Wt Readings from Last 3 Encounters:  08/31/16 146 lb (66.2 kg)  08/17/16 146 lb 6.4 oz (66.4 kg)  07/12/16 150 lb (68 kg)     PHYSICAL EXAM: VS:  There were no vitals taken for this visit. , BMI There is no height or weight on file to calculate BMI. General:Pleasant affect, NAD Skin:Warm and dry, brisk capillary refill HEENT:normocephalic, sclera clear, mucus membranes moist Neck:supple, no JVD, no  bruits  Heart:S1S2 RRR without murmur, gallup, rub or click Lungs:clear without rales, rhonchi, or wheezes QKM:MNOT, non tender, + BS, do not palpate liver spleen or masses Ext:no lower ext edema, 2+ pedal pulses, 2+ radial pulses Neuro:alert and oriented, MAE, follows commands, + facial symmetry    EKG:  EKG is ordered today. The ekg ordered today demonstrates ***   Recent Labs: No results found for requested labs within last 8760 hours.    Lipid Panel    Component Value Date/Time   CHOL 215 (H) 06/14/2016 1003   TRIG 183.0 (H) 06/14/2016 1003   HDL 35.30 (L) 06/14/2016 1003   CHOLHDL 6 06/14/2016 1003   VLDL 36.6 06/14/2016 1003   LDLCALC 143 (H) 06/14/2016 1003       Other studies Reviewed: Additional studies/ records that were reviewed today include: ***.   ASSESSMENT AND PLAN:  1.  ***   Current medicines are reviewed with the patient today.  The patient Has no concerns regarding medicines.  The following changes have been made:  See above Labs/ tests ordered today include:see above  Disposition:   FU:  see above  Signed, Cecilie Kicks, NP  07/18/2017 9:37 PM    [facility] [place], [place] [place], [place] Phone: [phone]; Fax: [phone]

## 2017-07-19 ENCOUNTER — Ambulatory Visit: Payer: Medicare Other | Admitting: Cardiology

## 2017-08-27 NOTE — Progress Notes (Deleted)
Cardiology Office Note   Date:  08/27/2017   ID:  Alan Mendoza, DOB [birth year], MRN [id number]  PCP:  Dorothyann Peng, NP  Cardiologist:  Dr. Acie Fredrickson    No chief complaint on file.     History of Present Illness: Alan Mendoza is a 52 y.o. male who presents for CAD, DM, CVA, deafness, HLD and pulmonary nodules  Had a stent place in [place], [place] in 2014 (ECU) - [facility]. Last seen 11/03/14 by Dr. Acie Fredrickson. Recently started on statin when seen by PCP 06/14/2016 for routine follow up.    Was in ER 3/19 with cough and COPD exacerbation.     Past Medical History:  Diagnosis Date  . Arthritis   . Coronary artery disease   . 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 Diabetes mellitus without complication ([place])    not on meds cks blood sugar weekly  . Emphysema lung ([place]) 03/2014   on CT of chest  . HLD (hyperlipidemia)   . Myocardial infarction ([place])    2014  . Pulmonary nodule 03/2014   on CT of chest  . Stroke [facility])    denies  . Tobacco abuse     Past Surgical History:  Procedure Laterality Date  . CARDIAC CATHETERIZATION  11/14   stent placement  . EXAM UNDER ANESTHESIA WITH MANIPULATION OF KNEE Left 04/21/2014   Procedure: EXAM UNDER ANESTHESIA WITH MANIPULATION OF KNEE;  Surgeon: Meredith Pel, MD;  Location: [place];  Service: Orthopedics;  Laterality: Left;  LEFT KNEE MANIPULATION UNDER ANESTHESIA  . KNEE ARTHROSCOPY Left 1984  . NECK SURGERY  10/14  . TOTAL KNEE ARTHROPLASTY Left 03/17/2014   Procedure: TOTAL KNEE ARTHROPLASTY;  Surgeon: Meredith Pel, MD;  Location: [place];  Service: Orthopedics;  Laterality: Left;     Current Outpatient Medications  Medication Sig Dispense Refill  . albuterol (PROVENTIL HFA;VENTOLIN HFA) 108 (90 Base) MCG/ACT inhaler Inhale 2 puffs into the lungs every 6 (six) hours as needed for wheezing or shortness of breath. 1 Inhaler 2  . atorvastatin (LIPITOR) 40 MG tablet Take 1  tablet (40 mg total) by mouth daily at 6 PM. 30 tablet 0  . metFORMIN (GLUCOPHAGE) 500 MG tablet Take 1 tablet (500 mg total) by mouth daily with breakfast. 30 tablet 0  . Spacer/Aero-Holding Chambers (AEROCHAMBER PLUS) inhaler Use as instructed 1 each 2   No current facility-administered medications for this visit.     Allergies:   Patient has no known allergies.    Social History:  The patient  reports that he has been smoking cigarettes. He has a 16.50 pack-year smoking history. He has never used smokeless tobacco. He reports that he does not drink alcohol or use drugs.   Family History:  The patient's ***family history includes Alcoholism in his unknown relative; Breast cancer in his unknown relative; Diabetes in his mother; Hypertension in his mother and unknown relative; Kidney disease in his unknown relative; Osteoarthritis in his mother.    ROS:  General:no colds or fevers, no weight changes Skin:no rashes or ulcers HEENT:no blurred vision, no congestion CV:see HPI PUL:see HPI GI:no diarrhea constipation or melena, no indigestion GU:no hematuria, no dysuria MS:no joint pain, no claudication Neuro:no syncope, no lightheadedness Endo:no diabetes, no thyroid disease Wt Readings from Last 3 Encounters:  08/31/16 146 lb (66.2 kg)  08/17/16 146 lb 6.4 oz (66.4 kg)  07/12/16 150 lb (68 kg)  PHYSICAL EXAM: VS:  There were no vitals taken for this visit. , BMI There is no height or weight on file to calculate BMI. General:Pleasant affect, NAD Skin:Warm and dry, brisk capillary refill HEENT:normocephalic, sclera clear, mucus membranes moist Neck:supple, no JVD, no bruits  Heart:S1S2 RRR without murmur, gallup, rub or click Lungs:clear without rales, rhonchi, or wheezes HJS:CBIP, non tender, + BS, do not palpate liver spleen or masses Ext:no lower ext edema, 2+ pedal pulses, 2+ radial pulses Neuro:alert and oriented, MAE, follows commands, + facial symmetry    EKG:  EKG  is ordered today. The ekg ordered today demonstrates ***   Recent Labs: No results found for requested labs within last 8760 hours.    Lipid Panel    Component Value Date/Time   CHOL 215 (H) 06/14/2016 1003   TRIG 183.0 (H) 06/14/2016 1003   HDL 35.30 (L) 06/14/2016 1003   CHOLHDL 6 06/14/2016 1003   VLDL 36.6 06/14/2016 1003   LDLCALC 143 (H) 06/14/2016 1003       Other studies Reviewed: Additional studies/ records that were reviewed today include: ***.   ASSESSMENT AND PLAN:  1.  ***   Current medicines are reviewed with the patient today.  The patient Has no concerns regarding medicines.  The following changes have been made:  See above Labs/ tests ordered today include:see above  Disposition:   FU:  see above  Signed, Cecilie Kicks, NP  08/27/2017 12:08 PM    [address] [place], [place], [place], [place] Phone: [phone]; Fax: [phone]

## 2017-08-28 ENCOUNTER — Ambulatory Visit: Payer: Medicare Other | Admitting: Cardiology

## 2017-09-26 NOTE — Progress Notes (Signed)
Cardiology Office Note   Date:  09/27/2017   ID:  Alan Mendoza, DOB 1965/01/13, MRN [id number]  PCP:  Dorothyann Peng, NP  Cardiologist:  Dr. Acie Fredrickson     Chief Complaint  Patient presents with  . Coronary Artery Disease    no pain      History of Present Illness: Alan Mendoza is a 52 y.o. male who presents for CAD.    CAD, DM, CVA, deafness, HLD and pulmonary nodules presents for follow up.   Had a stent place in [place], [place] in 2014 (ECU) - [facility]. Last seen 11/03/14 by Dr. Acie Fredrickson. Recently started on statin when seen by PCP 06/14/2016 for routine follow up.   Here today for follow up with sign language interpreter. Not taking ASA. We discussed, no chest pain and only with dyspnea when he first gets up.  Continues to smoke half PPD.  we discussed importance of stopping.  He walks a lot and eats healthy.  He noted he no longer has DM because eats healthy.  No longer on statin.   Past Medical History:  Diagnosis Date  . Arthritis   . Coronary artery disease   . 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 Diabetes mellitus without complication ([place])    not on meds cks blood sugar weekly  . Emphysema lung ([place]) 03/2014   on CT of chest  . HLD (hyperlipidemia)   . Myocardial infarction ([place])    2014  . Pulmonary nodule 03/2014   on CT of chest  . Stroke [facility])    denies  . Tobacco abuse     Past Surgical History:  Procedure Laterality Date  . CARDIAC CATHETERIZATION  11/14   stent placement  . EXAM UNDER ANESTHESIA WITH MANIPULATION OF KNEE Left 04/21/2014   Procedure: EXAM UNDER ANESTHESIA WITH MANIPULATION OF KNEE;  Surgeon: Meredith Pel, MD;  Location: [place];  Service: Orthopedics;  Laterality: Left;  LEFT KNEE MANIPULATION UNDER ANESTHESIA  . KNEE ARTHROSCOPY Left 1984  . NECK SURGERY  10/14  . TOTAL KNEE ARTHROPLASTY Left 03/17/2014   Procedure: TOTAL KNEE ARTHROPLASTY;  Surgeon: Meredith Pel, MD;   Location: [place];  Service: Orthopedics;  Laterality: Left;     Current Outpatient Medications  Medication Sig Dispense Refill  . albuterol (PROVENTIL HFA;VENTOLIN HFA) 108 (90 Base) MCG/ACT inhaler Inhale 2 puffs into the lungs every 6 (six) hours as needed for wheezing or shortness of breath. 1 Inhaler 0  . aspirin EC 81 MG tablet Take 1 tablet (81 mg total) by mouth daily.    Marland Kitchen atorvastatin (LIPITOR) 40 MG tablet Take 1 tablet (40 mg total) by mouth daily. 90 tablet 3   No current facility-administered medications for this visit.     Allergies:   Patient has no known allergies.    Social History:  The patient  reports that he has been smoking cigarettes. He has a 16.50 pack-year smoking history. He has never used smokeless tobacco. He reports that he does not drink alcohol or use drugs.   Family History:  The patient's family history includes Alcoholism in his unknown relative; Breast cancer in his unknown relative; Diabetes in his mother; Hypertension in his mother and unknown relative; Kidney disease in his unknown relative; Osteoarthritis in his mother.    ROS:  General:no colds or fevers, no weight changes Skin:no rashes or ulcers HEENT:no blurred vision, no congestion CV:see HPI PUL:see  HPI GI:no diarrhea constipation or melena, no indigestion GU:no hematuria, no dysuria MS:no joint pain, no claudication Neuro:no syncope, no lightheadedness Endo:no diabetes, no thyroid disease  Wt Readings from Last 3 Encounters:  09/27/17 147 lb (66.7 kg)  08/31/16 146 lb (66.2 kg)  08/17/16 146 lb 6.4 oz (66.4 kg)     PHYSICAL EXAM: VS:  BP 120/80   Pulse 73   Ht 5\' 7"  (1.702 m)   Wt 147 lb (66.7 kg)   SpO2 99%   BMI 23.02 kg/m  , BMI Body mass index is 23.02 kg/m. General:Pleasant affect, NAD Skin:Warm and dry, brisk capillary refill HEENT:normocephalic, sclera clear, mucus membranes moist Neck:supple, no JVD, no bruits  Heart:S1S2 RRR without murmur, gallup, rub or  click Lungs:clear without rales, rhonchi, or wheezes RVU:YEBX, non tender, + BS, do not palpate liver spleen or masses Ext:no lower ext edema, 2+ pedal pulses, 2+ radial pulses Neuro:alert and oriented X 3, MAE, follows commands, + facial symmetry    EKG:  EKG is ordered today. The ekg ordered today demonstrates SR no changes.     Recent Labs: No results found for requested labs within last 8760 hours.    Lipid Panel    Component Value Date/Time   CHOL 215 (H) 06/14/2016 1003   TRIG 183.0 (H) 06/14/2016 1003   HDL 35.30 (L) 06/14/2016 1003   CHOLHDL 6 06/14/2016 1003   VLDL 36.6 06/14/2016 1003   LDLCALC 143 (H) 06/14/2016 1003       Other studies Reviewed: Additional studies/ records that were reviewed today include: .  CHEST  2 VIEW 03/05/17  COMPARISON:  Chest x-ray dated March 05, 2014.  FINDINGS: The patient is slightly rotated to the right. The heart size and mediastinal contours are within normal limits. Normal pulmonary vascularity. The lungs remain mildly hyperinflated with emphysematous changes. Unchanged bilateral nipple shadows. No focal consolidation, pleural effusion, or pneumothorax. No acute osseous abnormality.  IMPRESSION: COPD.  No active cardiopulmonary disease.   ASSESSMENT AND PLAN:  1. CAD with hx of stent in [place],  I have asked him to resume ASA and statin.  Discussed need to look at next 5 years, not just today.  He was agreeable to resume statin.  lipitor 40 mg dialy.  Also asa.  He will follow up in 6 months with Dr. Acie Fredrickson.     2.  HLD see above will check labs today, he is fasting  3.  DM-2, will check Hgb A1C.    4.  Hx of lung nodule, per PCP, last CXR stable,   5.  COPD with ongoing tobacco use.  Discussed importance of stopping.   6.  SOB will check BNP.    Current medicines are reviewed with the patient today.  The patient Has no concerns regarding medicines.  The following changes have been made:  See  above Labs/ tests ordered today include:see above  Disposition:   FU:  see above  Signed, Cecilie Kicks, NP  09/27/2017 11:16 AM    [facility] [place], [place], [place], [place] Phone: [phone]; Fax: [phone]

## 2017-09-27 ENCOUNTER — Encounter: Payer: Self-pay | Admitting: Cardiology

## 2017-09-27 ENCOUNTER — Ambulatory Visit (INDEPENDENT_AMBULATORY_CARE_PROVIDER_SITE_OTHER): Payer: Medicare Other | Admitting: Cardiology

## 2017-09-27 ENCOUNTER — Encounter (INDEPENDENT_AMBULATORY_CARE_PROVIDER_SITE_OTHER): Payer: Self-pay

## 2017-09-27 VITALS — BP 120/80 | HR 73 | Ht 67.0 in | Wt 147.0 lb

## 2017-09-27 DIAGNOSIS — E119 Type 2 diabetes mellitus without complications: Secondary | ICD-10-CM

## 2017-09-27 DIAGNOSIS — I25119 Atherosclerotic heart disease of native coronary artery with unspecified angina pectoris: Secondary | ICD-10-CM | POA: Diagnosis not present

## 2017-09-27 DIAGNOSIS — R0602 Shortness of breath: Secondary | ICD-10-CM

## 2017-09-27 DIAGNOSIS — I2510 Atherosclerotic heart disease of native coronary artery without angina pectoris: Secondary | ICD-10-CM | POA: Diagnosis not present

## 2017-09-27 DIAGNOSIS — E785 Hyperlipidemia, unspecified: Secondary | ICD-10-CM | POA: Diagnosis not present

## 2017-09-27 MED ORDER — ALBUTEROL SULFATE HFA 108 (90 BASE) MCG/ACT IN AERS: 2.0000 | INHALATION_SPRAY | Freq: Four times a day (QID) | RESPIRATORY_TRACT | 0 refills | Status: DC | PRN

## 2017-09-27 MED ORDER — ASPIRIN EC 81 MG PO TBEC: 81.0000 mg | DELAYED_RELEASE_TABLET | Freq: Every day | ORAL | Status: DC

## 2017-09-27 MED ORDER — ATORVASTATIN CALCIUM 40 MG PO TABS: 40.0000 mg | ORAL_TABLET | Freq: Every day | ORAL | 3 refills | Status: DC

## 2017-09-27 NOTE — Patient Instructions (Addendum)
Medication Instructions:  1. START ASPIRIN 81 MG DAILY  2. START LIPITOR 40 MG EVERY NIGHT AT BEDTIME; RX HAS BEEN SENT IN   3. A ONE TIME REFILL FOR YOUR ALBUTEROL INHALER HAS BEEN SENT IN; FURTHER REFILLS TO BE DONE WITH PULMONARY OR PRIMARY CARE PHYSCIAN  Labwork: TODAY CMET, HGB A1C, LIPIDS, PRO BNP  Testing/Procedures: NONE ORDERED TODAY  Follow-Up: 6 MONTHS DR. Acie Fredrickson   Any Other Special Instructions Will Be Listed Below (If Applicable).     If you need a refill on your cardiac medications before your next appointment, please call your pharmacy.

## 2017-09-28 LAB — COMPREHENSIVE METABOLIC PANEL
A/G RATIO: 2 (ref 1.2–2.2)
ALT: 13 IU/L (ref 0–44)
AST: 12 IU/L (ref 0–40)
Albumin: 4.8 g/dL (ref 3.5–5.5)
Alkaline Phosphatase: 103 IU/L (ref 39–117)
BUN/Creatinine Ratio: 18 (ref 9–20)
BUN: 15 mg/dL (ref 6–24)
Bilirubin Total: 0.4 mg/dL (ref 0.0–1.2)
CALCIUM: 9.5 mg/dL (ref 8.7–10.2)
CHLORIDE: 107 mmol/L — AB (ref 96–106)
CO2: 20 mmol/L (ref 20–29)
Creatinine, Ser: 0.85 mg/dL (ref 0.76–1.27)
GFR, EST AFRICAN AMERICAN: 114 mL/min/{1.73_m2} (ref >59)
GFR, EST NON AFRICAN AMERICAN: 99 mL/min/{1.73_m2} (ref >59)
GLUCOSE: 112 mg/dL — AB (ref 65–99)
Globulin, Total: 2.4 g/dL (ref 1.5–4.5)
POTASSIUM: 4.6 mmol/L (ref 3.5–5.2)
Sodium: 143 mmol/L (ref 134–144)
TOTAL PROTEIN: 7.2 g/dL (ref 6.0–8.5)

## 2017-09-28 LAB — LIPID PANEL
CHOLESTEROL TOTAL: 193 mg/dL (ref 100–199)
Chol/HDL Ratio: 5.1 ratio — ABNORMAL HIGH (ref 0.0–5.0)
HDL: 38 mg/dL — ABNORMAL LOW (ref >39)
LDL CALC: 124 mg/dL — AB (ref 0–99)
TRIGLYCERIDES: 156 mg/dL — AB (ref 0–149)
VLDL Cholesterol Cal: 31 mg/dL (ref 5–40)

## 2017-09-28 LAB — PRO B NATRIURETIC PEPTIDE: NT-Pro BNP: 62 pg/mL (ref 0–121)

## 2017-09-28 LAB — HEMOGLOBIN A1C
ESTIMATED AVERAGE GLUCOSE: 131 mg/dL
Hgb A1c MFr Bld: 6.2 % — ABNORMAL HIGH (ref 4.8–5.6)

## 2017-10-01 ENCOUNTER — Telehealth: Payer: Self-pay | Admitting: Cardiology

## 2017-10-01 DIAGNOSIS — E783 Hyperchylomicronemia: Secondary | ICD-10-CM

## 2017-10-01 NOTE — Telephone Encounter (Signed)
Pt aware of lab results and recommendations. Pt and I discussed ways to lower his cholesterol with medication, diet and exercise. Pt was educated to stay away from fatty meats, crisco, fried foods, and fast foods. Pt was unaware these foods can cause increase in his bad cholesterol. Pt encouraged to start using more natural oils such as olive and avocado oils. Pt has agreed to have repeat labs on Dec 30. He will come in fasting. Pt had no additional questions.

## 2017-10-01 NOTE — Telephone Encounter (Signed)
New Message: ° ° ° °Patient is calling for lab results  °

## 2017-12-07 DIAGNOSIS — E785 Hyperlipidemia, unspecified: Secondary | ICD-10-CM | POA: Diagnosis not present

## 2017-12-07 DIAGNOSIS — J9601 Acute respiratory failure with hypoxia: Secondary | ICD-10-CM | POA: Diagnosis not present

## 2017-12-07 DIAGNOSIS — R0902 Hypoxemia: Secondary | ICD-10-CM | POA: Diagnosis not present

## 2017-12-07 DIAGNOSIS — J441 Chronic obstructive pulmonary disease with (acute) exacerbation: Secondary | ICD-10-CM | POA: Diagnosis not present

## 2017-12-07 DIAGNOSIS — R062 Wheezing: Secondary | ICD-10-CM | POA: Diagnosis not present

## 2017-12-07 DIAGNOSIS — R05 Cough: Secondary | ICD-10-CM | POA: Diagnosis not present

## 2017-12-07 DIAGNOSIS — R000 Tachycardia, unspecified: Secondary | ICD-10-CM | POA: Diagnosis not present

## 2017-12-07 DIAGNOSIS — F419 Anxiety disorder, unspecified: Secondary | ICD-10-CM | POA: Diagnosis not present

## 2017-12-07 DIAGNOSIS — R0602 Shortness of breath: Secondary | ICD-10-CM | POA: Diagnosis not present

## 2017-12-07 DIAGNOSIS — J9602 Acute respiratory failure with hypercapnia: Secondary | ICD-10-CM | POA: Diagnosis not present

## 2017-12-07 DIAGNOSIS — Z955 Presence of coronary angioplasty implant and graft: Secondary | ICD-10-CM | POA: Diagnosis not present

## 2017-12-07 DIAGNOSIS — Z5320 Procedure and treatment not carried out because of patient's decision for unspecified reasons: Secondary | ICD-10-CM | POA: Diagnosis not present

## 2017-12-07 DIAGNOSIS — J189 Pneumonia, unspecified organism: Secondary | ICD-10-CM | POA: Diagnosis not present

## 2017-12-07 DIAGNOSIS — R079 Chest pain, unspecified: Secondary | ICD-10-CM | POA: Diagnosis not present

## 2017-12-07 DIAGNOSIS — Z7722 Contact with and (suspected) exposure to environmental tobacco smoke (acute) (chronic): Secondary | ICD-10-CM | POA: Diagnosis not present

## 2017-12-08 DIAGNOSIS — R918 Other nonspecific abnormal finding of lung field: Secondary | ICD-10-CM | POA: Diagnosis not present

## 2017-12-08 DIAGNOSIS — R069 Unspecified abnormalities of breathing: Secondary | ICD-10-CM | POA: Diagnosis not present

## 2017-12-08 DIAGNOSIS — R062 Wheezing: Secondary | ICD-10-CM | POA: Diagnosis not present

## 2017-12-08 DIAGNOSIS — F419 Anxiety disorder, unspecified: Secondary | ICD-10-CM | POA: Diagnosis not present

## 2017-12-08 DIAGNOSIS — R0689 Other abnormalities of breathing: Secondary | ICD-10-CM | POA: Diagnosis not present

## 2017-12-08 DIAGNOSIS — J189 Pneumonia, unspecified organism: Secondary | ICD-10-CM | POA: Diagnosis not present

## 2017-12-08 DIAGNOSIS — R0902 Hypoxemia: Secondary | ICD-10-CM | POA: Diagnosis not present

## 2017-12-08 DIAGNOSIS — R0602 Shortness of breath: Secondary | ICD-10-CM | POA: Diagnosis not present

## 2017-12-08 DIAGNOSIS — R079 Chest pain, unspecified: Secondary | ICD-10-CM | POA: Diagnosis not present

## 2017-12-08 DIAGNOSIS — R000 Tachycardia, unspecified: Secondary | ICD-10-CM | POA: Diagnosis not present

## 2017-12-08 DIAGNOSIS — R05 Cough: Secondary | ICD-10-CM | POA: Diagnosis not present

## 2017-12-09 DIAGNOSIS — Z781 Physical restraint status: Secondary | ICD-10-CM | POA: Diagnosis not present

## 2017-12-09 DIAGNOSIS — J441 Chronic obstructive pulmonary disease with (acute) exacerbation: Secondary | ICD-10-CM | POA: Diagnosis present

## 2017-12-09 DIAGNOSIS — Z4682 Encounter for fitting and adjustment of non-vascular catheter: Secondary | ICD-10-CM | POA: Diagnosis not present

## 2017-12-09 DIAGNOSIS — I2510 Atherosclerotic heart disease of native coronary artery without angina pectoris: Secondary | ICD-10-CM | POA: Diagnosis present

## 2017-12-09 DIAGNOSIS — E785 Hyperlipidemia, unspecified: Secondary | ICD-10-CM | POA: Diagnosis present

## 2017-12-09 DIAGNOSIS — B974 Respiratory syncytial virus as the cause of diseases classified elsewhere: Secondary | ICD-10-CM | POA: Diagnosis present

## 2017-12-09 DIAGNOSIS — J9602 Acute respiratory failure with hypercapnia: Secondary | ICD-10-CM | POA: Diagnosis present

## 2017-12-09 DIAGNOSIS — J9601 Acute respiratory failure with hypoxia: Secondary | ICD-10-CM | POA: Diagnosis present

## 2017-12-09 DIAGNOSIS — H905 Unspecified sensorineural hearing loss: Secondary | ICD-10-CM | POA: Diagnosis present

## 2017-12-09 DIAGNOSIS — F17200 Nicotine dependence, unspecified, uncomplicated: Secondary | ICD-10-CM | POA: Diagnosis not present

## 2017-12-09 DIAGNOSIS — H9190 Unspecified hearing loss, unspecified ear: Secondary | ICD-10-CM | POA: Diagnosis not present

## 2017-12-09 DIAGNOSIS — Z5320 Procedure and treatment not carried out because of patient's decision for unspecified reasons: Secondary | ICD-10-CM | POA: Diagnosis present

## 2017-12-09 DIAGNOSIS — F17210 Nicotine dependence, cigarettes, uncomplicated: Secondary | ICD-10-CM | POA: Diagnosis present

## 2017-12-09 DIAGNOSIS — Z9911 Dependence on respirator [ventilator] status: Secondary | ICD-10-CM | POA: Diagnosis not present

## 2017-12-09 DIAGNOSIS — I10 Essential (primary) hypertension: Secondary | ICD-10-CM | POA: Diagnosis present

## 2017-12-09 DIAGNOSIS — R109 Unspecified abdominal pain: Secondary | ICD-10-CM | POA: Diagnosis present

## 2017-12-09 DIAGNOSIS — J101 Influenza due to other identified influenza virus with other respiratory manifestations: Secondary | ICD-10-CM | POA: Diagnosis present

## 2017-12-09 DIAGNOSIS — Z955 Presence of coronary angioplasty implant and graft: Secondary | ICD-10-CM | POA: Diagnosis not present

## 2017-12-09 DIAGNOSIS — J09X2 Influenza due to identified novel influenza A virus with other respiratory manifestations: Secondary | ICD-10-CM | POA: Diagnosis not present

## 2017-12-09 DIAGNOSIS — E119 Type 2 diabetes mellitus without complications: Secondary | ICD-10-CM | POA: Diagnosis present

## 2017-12-31 ENCOUNTER — Other Ambulatory Visit: Payer: Medicare Other

## 2018-01-24 DIAGNOSIS — S93401A Sprain of unspecified ligament of right ankle, initial encounter: Secondary | ICD-10-CM | POA: Diagnosis not present

## 2018-01-24 DIAGNOSIS — X58XXXA Exposure to other specified factors, initial encounter: Secondary | ICD-10-CM | POA: Diagnosis not present

## 2018-01-24 DIAGNOSIS — M25571 Pain in right ankle and joints of right foot: Secondary | ICD-10-CM | POA: Diagnosis not present

## 2018-01-24 DIAGNOSIS — Y999 Unspecified external cause status: Secondary | ICD-10-CM | POA: Diagnosis not present

## 2018-02-18 DIAGNOSIS — Z8639 Personal history of other endocrine, nutritional and metabolic disease: Secondary | ICD-10-CM | POA: Diagnosis not present

## 2018-02-18 DIAGNOSIS — F17210 Nicotine dependence, cigarettes, uncomplicated: Secondary | ICD-10-CM | POA: Diagnosis not present

## 2018-02-18 DIAGNOSIS — J069 Acute upper respiratory infection, unspecified: Secondary | ICD-10-CM | POA: Diagnosis not present

## 2018-02-18 DIAGNOSIS — Z8709 Personal history of other diseases of the respiratory system: Secondary | ICD-10-CM | POA: Diagnosis not present

## 2018-02-18 DIAGNOSIS — R062 Wheezing: Secondary | ICD-10-CM | POA: Diagnosis not present

## 2018-04-24 DIAGNOSIS — F17210 Nicotine dependence, cigarettes, uncomplicated: Secondary | ICD-10-CM | POA: Diagnosis not present

## 2018-04-24 DIAGNOSIS — R0689 Other abnormalities of breathing: Secondary | ICD-10-CM | POA: Diagnosis not present

## 2018-04-24 DIAGNOSIS — J441 Chronic obstructive pulmonary disease with (acute) exacerbation: Secondary | ICD-10-CM | POA: Diagnosis not present

## 2018-04-24 DIAGNOSIS — R0602 Shortness of breath: Secondary | ICD-10-CM | POA: Diagnosis not present

## 2018-04-24 DIAGNOSIS — Z20828 Contact with and (suspected) exposure to other viral communicable diseases: Secondary | ICD-10-CM | POA: Diagnosis not present

## 2018-05-07 DIAGNOSIS — J449 Chronic obstructive pulmonary disease, unspecified: Secondary | ICD-10-CM | POA: Diagnosis not present

## 2018-05-07 DIAGNOSIS — R0602 Shortness of breath: Secondary | ICD-10-CM | POA: Diagnosis not present

## 2018-05-07 DIAGNOSIS — F17210 Nicotine dependence, cigarettes, uncomplicated: Secondary | ICD-10-CM | POA: Diagnosis not present

## 2018-05-07 DIAGNOSIS — H913 Deaf nonspeaking, not elsewhere classified: Secondary | ICD-10-CM | POA: Diagnosis not present

## 2018-05-07 DIAGNOSIS — J45901 Unspecified asthma with (acute) exacerbation: Secondary | ICD-10-CM | POA: Diagnosis not present

## 2018-05-07 DIAGNOSIS — Z03818 Encounter for observation for suspected exposure to other biological agents ruled out: Secondary | ICD-10-CM | POA: Diagnosis not present

## 2018-05-07 DIAGNOSIS — Z8639 Personal history of other endocrine, nutritional and metabolic disease: Secondary | ICD-10-CM | POA: Diagnosis not present

## 2018-05-07 DIAGNOSIS — Z20828 Contact with and (suspected) exposure to other viral communicable diseases: Secondary | ICD-10-CM | POA: Diagnosis not present

## 2018-05-07 DIAGNOSIS — Z590 Homelessness: Secondary | ICD-10-CM | POA: Diagnosis not present

## 2018-05-21 DIAGNOSIS — Z79899 Other long term (current) drug therapy: Secondary | ICD-10-CM | POA: Diagnosis not present

## 2018-05-21 DIAGNOSIS — J441 Chronic obstructive pulmonary disease with (acute) exacerbation: Secondary | ICD-10-CM | POA: Diagnosis not present

## 2018-05-21 DIAGNOSIS — K5900 Constipation, unspecified: Secondary | ICD-10-CM | POA: Diagnosis not present

## 2018-05-21 DIAGNOSIS — J449 Chronic obstructive pulmonary disease, unspecified: Secondary | ICD-10-CM | POA: Diagnosis not present

## 2018-05-21 DIAGNOSIS — F17210 Nicotine dependence, cigarettes, uncomplicated: Secondary | ICD-10-CM | POA: Diagnosis not present

## 2018-06-20 DIAGNOSIS — K5900 Constipation, unspecified: Secondary | ICD-10-CM | POA: Diagnosis not present

## 2018-09-19 DIAGNOSIS — K5900 Constipation, unspecified: Secondary | ICD-10-CM | POA: Diagnosis not present

## 2018-09-19 DIAGNOSIS — Z23 Encounter for immunization: Secondary | ICD-10-CM | POA: Diagnosis not present

## 2018-09-19 DIAGNOSIS — Z955 Presence of coronary angioplasty implant and graft: Secondary | ICD-10-CM | POA: Diagnosis not present

## 2018-09-19 DIAGNOSIS — I2510 Atherosclerotic heart disease of native coronary artery without angina pectoris: Secondary | ICD-10-CM | POA: Diagnosis not present

## 2018-09-19 DIAGNOSIS — Z7951 Long term (current) use of inhaled steroids: Secondary | ICD-10-CM | POA: Diagnosis not present

## 2018-09-19 DIAGNOSIS — J449 Chronic obstructive pulmonary disease, unspecified: Secondary | ICD-10-CM | POA: Diagnosis not present

## 2018-09-19 DIAGNOSIS — F17210 Nicotine dependence, cigarettes, uncomplicated: Secondary | ICD-10-CM | POA: Diagnosis not present

## 2018-10-02 DIAGNOSIS — H25813 Combined forms of age-related cataract, bilateral: Secondary | ICD-10-CM | POA: Diagnosis not present

## 2018-10-02 DIAGNOSIS — E119 Type 2 diabetes mellitus without complications: Secondary | ICD-10-CM | POA: Diagnosis not present

## 2018-10-02 DIAGNOSIS — Z590 Homelessness: Secondary | ICD-10-CM | POA: Diagnosis not present

## 2018-10-02 DIAGNOSIS — H00011 Hordeolum externum right upper eyelid: Secondary | ICD-10-CM | POA: Diagnosis not present

## 2018-10-02 DIAGNOSIS — L03213 Periorbital cellulitis: Secondary | ICD-10-CM | POA: Diagnosis not present

## 2018-10-24 DIAGNOSIS — Z1211 Encounter for screening for malignant neoplasm of colon: Secondary | ICD-10-CM | POA: Diagnosis not present

## 2018-10-24 DIAGNOSIS — F17210 Nicotine dependence, cigarettes, uncomplicated: Secondary | ICD-10-CM | POA: Diagnosis not present

## 2018-10-24 DIAGNOSIS — Z79899 Other long term (current) drug therapy: Secondary | ICD-10-CM | POA: Diagnosis not present

## 2018-10-24 DIAGNOSIS — I2510 Atherosclerotic heart disease of native coronary artery without angina pectoris: Secondary | ICD-10-CM | POA: Diagnosis not present

## 2018-10-24 DIAGNOSIS — Z7951 Long term (current) use of inhaled steroids: Secondary | ICD-10-CM | POA: Diagnosis not present

## 2018-10-24 DIAGNOSIS — Z7982 Long term (current) use of aspirin: Secondary | ICD-10-CM | POA: Diagnosis not present

## 2018-10-24 DIAGNOSIS — J42 Unspecified chronic bronchitis: Secondary | ICD-10-CM | POA: Insufficient documentation

## 2018-10-24 DIAGNOSIS — H905 Unspecified sensorineural hearing loss: Secondary | ICD-10-CM | POA: Diagnosis not present

## 2018-10-24 DIAGNOSIS — K5900 Constipation, unspecified: Secondary | ICD-10-CM | POA: Diagnosis not present

## 2018-10-24 DIAGNOSIS — H903 Sensorineural hearing loss, bilateral: Secondary | ICD-10-CM | POA: Diagnosis not present

## 2018-10-24 DIAGNOSIS — K641 Second degree hemorrhoids: Secondary | ICD-10-CM | POA: Diagnosis not present

## 2018-10-25 DIAGNOSIS — K641 Second degree hemorrhoids: Secondary | ICD-10-CM | POA: Diagnosis not present

## 2018-10-25 DIAGNOSIS — J42 Unspecified chronic bronchitis: Secondary | ICD-10-CM | POA: Diagnosis not present

## 2018-10-25 DIAGNOSIS — K5900 Constipation, unspecified: Secondary | ICD-10-CM | POA: Diagnosis not present

## 2018-10-25 DIAGNOSIS — I2510 Atherosclerotic heart disease of native coronary artery without angina pectoris: Secondary | ICD-10-CM | POA: Diagnosis not present

## 2018-10-25 DIAGNOSIS — H905 Unspecified sensorineural hearing loss: Secondary | ICD-10-CM | POA: Diagnosis not present

## 2018-10-25 DIAGNOSIS — Z9889 Other specified postprocedural states: Secondary | ICD-10-CM | POA: Diagnosis not present

## 2018-10-25 DIAGNOSIS — Z1211 Encounter for screening for malignant neoplasm of colon: Secondary | ICD-10-CM | POA: Diagnosis not present

## 2020-01-08 DIAGNOSIS — F322 Major depressive disorder, single episode, severe without psychotic features: Secondary | ICD-10-CM | POA: Insufficient documentation

## 2020-04-12 ENCOUNTER — Ambulatory Visit (INDEPENDENT_AMBULATORY_CARE_PROVIDER_SITE_OTHER): Payer: Medicare Other | Admitting: Family Medicine

## 2020-04-12 ENCOUNTER — Encounter: Payer: Self-pay | Admitting: Family Medicine

## 2020-04-12 ENCOUNTER — Other Ambulatory Visit: Payer: Self-pay

## 2020-04-12 VITALS — BP 110/70 | HR 101 | Temp 97.2°F | Ht 67.0 in | Wt 152.0 lb

## 2020-04-12 DIAGNOSIS — F17200 Nicotine dependence, unspecified, uncomplicated: Secondary | ICD-10-CM

## 2020-04-12 DIAGNOSIS — M7918 Myalgia, other site: Secondary | ICD-10-CM | POA: Insufficient documentation

## 2020-04-12 DIAGNOSIS — F322 Major depressive disorder, single episode, severe without psychotic features: Secondary | ICD-10-CM | POA: Diagnosis not present

## 2020-04-12 DIAGNOSIS — R7303 Prediabetes: Secondary | ICD-10-CM | POA: Diagnosis not present

## 2020-04-12 DIAGNOSIS — J42 Unspecified chronic bronchitis: Secondary | ICD-10-CM

## 2020-04-12 DIAGNOSIS — I2510 Atherosclerotic heart disease of native coronary artery without angina pectoris: Secondary | ICD-10-CM | POA: Diagnosis not present

## 2020-04-12 DIAGNOSIS — M542 Cervicalgia: Secondary | ICD-10-CM

## 2020-04-12 MED ORDER — ALBUTEROL SULFATE HFA 108 (90 BASE) MCG/ACT IN AERS: 2.0000 | INHALATION_SPRAY | Freq: Four times a day (QID) | RESPIRATORY_TRACT | 2 refills | Status: DC | PRN

## 2020-04-12 MED ORDER — BUPROPION HCL ER (SR) 150 MG PO TB12: ORAL_TABLET | ORAL | 1 refills | Status: DC

## 2020-04-12 MED ORDER — SYMBICORT 160-4.5 MCG/ACT IN AERO: 2.0000 | INHALATION_SPRAY | Freq: Two times a day (BID) | RESPIRATORY_TRACT | 2 refills | Status: DC

## 2020-04-12 MED ORDER — DULOXETINE HCL 30 MG PO CPEP: 30.0000 mg | ORAL_CAPSULE | Freq: Every evening | ORAL | 0 refills | Status: DC

## 2020-04-12 MED ORDER — ATORVASTATIN CALCIUM 40 MG PO TABS: 40.0000 mg | ORAL_TABLET | Freq: Every day | ORAL | 3 refills | Status: DC

## 2020-04-12 NOTE — Assessment & Plan Note (Signed)
Cont healthy diet and exercise

## 2020-04-12 NOTE — Assessment & Plan Note (Signed)
Radiating neck pain. Discussed NSAIDs and referral to PT.

## 2020-04-12 NOTE — Assessment & Plan Note (Signed)
Stable. Cont symbicort 160-4.5 mcg and prn albuterol. Work to quit smoking

## 2020-04-12 NOTE — Progress Notes (Signed)
Subjective:     Gjon Letarte is a 55 y.o. male presenting for Establish Care (No concerns) and pinched nerve (In neck)     HPI  #Prediabetes - trying to do healthy diet - has gained some weight - difficulty to exercise due to a pinched nerve  #COPD - using inhalers - ok overall - inhaler is helping - using the albuterol depending on activity  #Depression  - would like to stop his depression medication - it is causing he him to sweat - started this 2 months ago - does not think it has helped with depression - doing therapy and counseling   #Pinched nerve - in shoulder and neck - has been on and off since 2015 - surgery in the neck  - right shoulder - also gets lower back pain when it is cold - left side of the neck  - notes tingling in the right 4th and 5th digits   Review of Systems   Social History   Tobacco Use  Smoking Status Light Tobacco Smoker  . Packs/day: 0.50  . Years: 33.00  . Pack years: 16.50  . Types: Cigarettes  Smokeless Tobacco Never Used        Objective:    BP Readings from Last 3 Encounters:  04/12/20 110/70  09/27/17 120/80  03/05/17 (!) 141/83   Wt Readings from Last 3 Encounters:  04/12/20 152 lb (68.9 kg)  09/27/17 147 lb (66.7 kg)  08/31/16 146 lb (66.2 kg)    BP 110/70   Pulse (!) 101   Temp (!) 97.2 F (36.2 C) (Temporal)   Ht 5\' 7"  (1.702 m)   Wt 152 lb (68.9 kg)   SpO2 96%   BMI 23.81 kg/m    Physical Exam Constitutional:      Appearance: Normal appearance. He is not ill-appearing or diaphoretic.  HENT:     Right Ear: External ear normal.     Left Ear: External ear normal.     Nose: Nose normal.  Eyes:     General: No scleral icterus.    Extraocular Movements: Extraocular movements intact.     Conjunctiva/sclera: Conjunctivae normal.  Cardiovascular:     Rate and Rhythm: Normal rate and regular rhythm.     Heart sounds: No murmur heard.   Pulmonary:     Effort: Pulmonary effort is normal. No  respiratory distress.     Breath sounds: Normal breath sounds. No wheezing.  Musculoskeletal:     Cervical back: Neck supple.     Comments: Shoulder: Inspection: no abnormalities Palpation: TTP along the right trapezius and rhomboid area, no joint line, no neck TTP ROM: normal but with pain at area of ttp Strength: normal  Skin:    General: Skin is warm and dry.  Neurological:     Mental Status: He is alert. Mental status is at baseline.  Psychiatric:        Mood and Affect: Mood normal.           Assessment & Plan:   Problem List Items Addressed This Visit      Cardiovascular and Mediastinum   CAD (coronary artery disease)    Stable. Taking asa 81mg  and lipitor 40 mg      Relevant Medications   atorvastatin (LIPITOR) 40 MG tablet     Respiratory   Chronic bronchitis (HCC)    Stable. Cont symbicort 160-4.5 mcg and prn albuterol. Work to quit smoking        Other  Tobacco use disorder    Start wellbutrin. Set quit date 1 week after starting.       MDD (major depressive disorder), severe ([place]) - Primary    Side effect to cymbalta. He would like to switch. Discussed wellbutrin due to smoking cessation support. Taper cymbalta and start wellbutrin 150 mg once daily x 1 week, increase to bid. Return in 6 weeks.       Relevant Medications   DULoxetine (CYMBALTA) 30 MG capsule   buPROPion (WELLBUTRIN SR) 150 MG 12 hr tablet   Prediabetes    Cont healthy diet and exercise      Neck pain on right side    Radiating neck pain. Discussed NSAIDs and referral to PT.       Relevant Orders   Ambulatory referral to Physical Therapy   Rhomboid muscle pain   Relevant Orders   Ambulatory referral to Physical Therapy       Return in about 6 weeks (around 05/24/2020).  Lesleigh Noe, MD  This visit occurred during the SARS-CoV-2 public health emergency.  Safety protocols were in place, including screening questions prior to the visit, additional usage of staff PPE,  and extensive cleaning of exam room while observing appropriate contact time as indicated for disinfecting solutions.

## 2020-04-12 NOTE — Patient Instructions (Addendum)
Changing depression medication.   Starting today 1) Cymbalta (Duloxetine) 30 mg for 2 weeks 2) Start Wellbutrin 150 mg in the morning for 1 week 3) After 1 week, increase wellbutrin to 150 mg twice daily  Stop cymbalta after 2 weeks  Plan to set quit date 1 week after starting Wellbutrin  #Referral I have placed a referral to a specialist for you. You should receive a phone call from the specialty office. Make sure your voicemail is not full and that if you are able to answer your phone to unknown or new numbers.   It may take up to 2 weeks to hear about the referral. If you do not hear anything in 2 weeks, please call our office and ask to speak with the referral coordinator.

## 2020-04-12 NOTE — Assessment & Plan Note (Signed)
Side effect to cymbalta. He would like to switch. Discussed wellbutrin due to smoking cessation support. Taper cymbalta and start wellbutrin 150 mg once daily x 1 week, increase to bid. Return in 6 weeks.

## 2020-04-12 NOTE — Assessment & Plan Note (Signed)
Start wellbutrin. Set quit date 1 week after starting.

## 2020-04-12 NOTE — Assessment & Plan Note (Signed)
Stable. Taking asa 81mg  and lipitor 40 mg

## 2020-05-09 ENCOUNTER — Other Ambulatory Visit: Payer: Self-pay | Admitting: Family Medicine

## 2020-05-11 ENCOUNTER — Ambulatory Visit: Payer: Medicare Other | Attending: Family Medicine

## 2020-05-24 ENCOUNTER — Ambulatory Visit: Payer: Medicare Other | Admitting: Family Medicine

## 2020-06-07 ENCOUNTER — Other Ambulatory Visit: Payer: Self-pay

## 2020-06-07 ENCOUNTER — Ambulatory Visit (INDEPENDENT_AMBULATORY_CARE_PROVIDER_SITE_OTHER): Payer: Medicare Other | Admitting: Family Medicine

## 2020-06-07 ENCOUNTER — Other Ambulatory Visit: Payer: Self-pay | Admitting: Family Medicine

## 2020-06-07 ENCOUNTER — Encounter: Payer: Self-pay | Admitting: Family Medicine

## 2020-06-07 VITALS — BP 118/86 | HR 71 | Temp 98.0°F | Ht 67.0 in | Wt 152.0 lb

## 2020-06-07 DIAGNOSIS — F322 Major depressive disorder, single episode, severe without psychotic features: Secondary | ICD-10-CM

## 2020-06-07 DIAGNOSIS — M542 Cervicalgia: Secondary | ICD-10-CM

## 2020-06-07 DIAGNOSIS — R7303 Prediabetes: Secondary | ICD-10-CM

## 2020-06-07 DIAGNOSIS — F17200 Nicotine dependence, unspecified, uncomplicated: Secondary | ICD-10-CM | POA: Diagnosis not present

## 2020-06-07 DIAGNOSIS — Z13220 Encounter for screening for lipoid disorders: Secondary | ICD-10-CM | POA: Diagnosis not present

## 2020-06-07 LAB — HEMOGLOBIN A1C: Hgb A1c MFr Bld: 6.8 % — ABNORMAL HIGH (ref 4.6–6.5)

## 2020-06-07 NOTE — Assessment & Plan Note (Signed)
Lab Results  Component Value Date   HGBA1C 6.2 (H) 09/27/2017  will recheck with blood work today. Cont healthy diet and exercise

## 2020-06-07 NOTE — Patient Instructions (Addendum)
Stopping Antidepressants Week 1:  (06/08/20) Stop Duloxetine (Cymbalta)  Continue Welbutrin (Buproprion) twice daily  Week 2: Only take Buproprion in the morning  Week 3: Stop all medications  Possible withdrawal symptoms: anxiety, headache, insomnia, irritability, and myalgias   If you get withdrawal symptoms you may need to move back to an earlier week or stay a dose longer    Vaccines - would recommend you consider getting the Shingrix and Tetanus shot at the pharmacy  Hand out for neck exercises  #Dry skin - try Eucerin or Cetaphil and ideally something that comes in tub - if persisting can plan to refer

## 2020-06-07 NOTE — Assessment & Plan Note (Signed)
Did not stop cymbalta and concerned about side effects and would like to stop. Discussed risk for return/worsening depression. He will stop cymbalta first and then taper wellbutrin. Call if withdrawal symptoms. Return 8 weeks to check in on depression

## 2020-06-07 NOTE — Progress Notes (Signed)
Subjective:     Alan Mendoza is a 55 y.o. male presenting for Depression and Follow-up     HPI  #Depression - started welbutrin and stopped cymbalta - would like to just stop everything - feels fuzzy headed and sleepy all the time - just does not feel it is worth it - has not looked into therapy  - notes his right arm and leg feel weaker every time he takes the medication   #Neck pain - right side - radiates to the shoulder - a few weeks ago - getting worse - treatment: no medications, ice/heat - no tingling or numbness    Review of Systems   Social History   Tobacco Use  Smoking Status Light Tobacco Smoker  . Packs/day: 0.50  . Years: 33.00  . Pack years: 16.50  . Types: Cigarettes  Smokeless Tobacco Never Used        Objective:    BP Readings from Last 3 Encounters:  06/07/20 118/86  04/12/20 110/70  09/27/17 120/80   Wt Readings from Last 3 Encounters:  06/07/20 152 lb (68.9 kg)  04/12/20 152 lb (68.9 kg)  09/27/17 147 lb (66.7 kg)    BP 118/86   Pulse 71   Temp 98 F (36.7 C) (Temporal)   Ht 5\' 7"  (1.702 m)   Wt 152 lb (68.9 kg)   SpO2 97%   BMI 23.81 kg/m    Physical Exam Constitutional:      Appearance: Normal appearance. He is not ill-appearing or diaphoretic.  HENT:     Right Ear: External ear normal.     Left Ear: External ear normal.  Eyes:     General: No scleral icterus.    Extraocular Movements: Extraocular movements intact.     Conjunctiva/sclera: Conjunctivae normal.  Neck:     Comments: Pain with right flexion Cardiovascular:     Rate and Rhythm: Normal rate.  Pulmonary:     Effort: Pulmonary effort is normal.  Musculoskeletal:     Cervical back: Normal range of motion and neck supple. No erythema or rigidity. Muscular tenderness (along the right trapezius) present. No spinous process tenderness.  Skin:    General: Skin is warm and dry.  Neurological:     Mental Status: He is alert. Mental status is at  baseline.  Psychiatric:        Mood and Affect: Mood normal.           Assessment & Plan:   Problem List Items Addressed This Visit      Other   Tobacco use disorder - Primary    Wellbutrin ineffective. Not interesting in quitting at this time.       MDD (major depressive disorder), severe ([place])    Did not stop cymbalta and concerned about side effects and would like to stop. Discussed risk for return/worsening depression. He will stop cymbalta first and then taper wellbutrin. Call if withdrawal symptoms. Return 8 weeks to check in on depression      Prediabetes    Lab Results  Component Value Date   HGBA1C 6.2 (H) 09/27/2017  will recheck with blood work today. Cont healthy diet and exercise       Relevant Orders   Hemoglobin A1c   Comprehensive metabolic panel   Neck pain on right side    Hand out for home exercises provided. Advised NSAIDs or topical treatment. PT if not improving.        Other Visit Diagnoses  Screening for hyperlipidemia       Relevant Orders   Comprehensive metabolic panel   Lipid panel       Return in about 8 weeks (around 08/02/2020) for depression f/u.  Lesleigh Noe, MD  This visit occurred during the SARS-CoV-2 public health emergency.  Safety protocols were in place, including screening questions prior to the visit, additional usage of staff PPE, and extensive cleaning of exam room while observing appropriate contact time as indicated for disinfecting solutions.

## 2020-06-07 NOTE — Assessment & Plan Note (Signed)
Hand out for home exercises provided. Advised NSAIDs or topical treatment. PT if not improving.

## 2020-06-07 NOTE — Assessment & Plan Note (Signed)
Wellbutrin ineffective. Not interesting in quitting at this time.

## 2020-06-08 ENCOUNTER — Encounter: Payer: Self-pay | Admitting: Family Medicine

## 2020-06-08 DIAGNOSIS — E1169 Type 2 diabetes mellitus with other specified complication: Secondary | ICD-10-CM | POA: Insufficient documentation

## 2020-06-08 LAB — LIPID PANEL
Cholesterol: 150 mg/dL (ref 0–200)
HDL: 53.9 mg/dL (ref >39.00)
LDL Cholesterol: 70 mg/dL (ref 0–99)
NonHDL: 96.58
Total CHOL/HDL Ratio: 3
Triglycerides: 135 mg/dL (ref 0.0–149.0)
VLDL: 27 mg/dL (ref 0.0–40.0)

## 2020-06-08 LAB — COMPREHENSIVE METABOLIC PANEL
ALT: 52 U/L (ref 0–53)
AST: 26 U/L (ref 0–37)
Albumin: 4.7 g/dL (ref 3.5–5.2)
Alkaline Phosphatase: 108 U/L (ref 39–117)
BUN: 14 mg/dL (ref 6–23)
CO2: 26 mEq/L (ref 19–32)
Calcium: 9.7 mg/dL (ref 8.4–10.5)
Chloride: 107 mEq/L (ref 96–112)
Creatinine, Ser: 0.92 mg/dL (ref 0.40–1.50)
GFR: 92.45 mL/min (ref >60.00)
Glucose, Bld: 124 mg/dL — ABNORMAL HIGH (ref 70–99)
Potassium: 4.4 mEq/L (ref 3.5–5.1)
Sodium: 141 mEq/L (ref 135–145)
Total Bilirubin: 0.5 mg/dL (ref 0.2–1.2)
Total Protein: 7.9 g/dL (ref 6.0–8.3)

## 2020-07-02 ENCOUNTER — Other Ambulatory Visit: Payer: Self-pay | Admitting: Family Medicine

## 2020-07-08 ENCOUNTER — Telehealth: Payer: Self-pay | Admitting: *Deleted

## 2020-07-08 DIAGNOSIS — E1169 Type 2 diabetes mellitus with other specified complication: Secondary | ICD-10-CM

## 2020-07-08 MED ORDER — METFORMIN HCL 500 MG PO TABS: 500.0000 mg | ORAL_TABLET | Freq: Two times a day (BID) | ORAL | 3 refills | Status: AC

## 2020-07-08 NOTE — Telephone Encounter (Signed)
Patient is returning phone call with interpreter, Alan Mendoza states that he received a letter in the mail about two weeks ago that they tried to contact him about his lab results.

## 2020-07-08 NOTE — Telephone Encounter (Signed)
Will send prescription for metformin.   Please call pt to let him know the medication is sent and he will also get a letter with how to take the medication.   Have him schedule a f/u visit in 3 months

## 2020-07-08 NOTE — Telephone Encounter (Signed)
Alan Mendoza left voicemail, through interpreter services, on triage asking for prescription for Metformin.  Metformin is not on current medication list.  Patient did have a recent A1c on 6.8 %.  Left voicemail for patient to call office back with more information.

## 2020-07-13 NOTE — Telephone Encounter (Signed)
Unable to reach pt by phone. Letter mailed to pt with Dr. Verda Cumins instructions.

## 2020-08-02 ENCOUNTER — Ambulatory Visit: Payer: Medicare Other | Admitting: Family Medicine

## 2020-09-08 ENCOUNTER — Other Ambulatory Visit: Payer: Self-pay | Admitting: Family Medicine

## 2020-09-08 DIAGNOSIS — C3491 Malignant neoplasm of unspecified part of right bronchus or lung: Secondary | ICD-10-CM

## 2020-09-20 ENCOUNTER — Emergency Department (HOSPITAL_COMMUNITY)
Admission: EM | Admit: 2020-09-20 | Discharge: 2020-09-20 | Disposition: A | Discharge status: Home / Self Care | Payer: Medicare Other | Attending: Emergency Medicine | Admitting: Emergency Medicine

## 2020-09-20 ENCOUNTER — Encounter (HOSPITAL_COMMUNITY): Payer: Self-pay | Admitting: *Deleted

## 2020-09-20 ENCOUNTER — Emergency Department (HOSPITAL_COMMUNITY): Payer: Medicare Other

## 2020-09-20 DIAGNOSIS — Z20822 Contact with and (suspected) exposure to covid-19: Secondary | ICD-10-CM | POA: Insufficient documentation

## 2020-09-20 DIAGNOSIS — R062 Wheezing: Secondary | ICD-10-CM

## 2020-09-20 DIAGNOSIS — F17210 Nicotine dependence, cigarettes, uncomplicated: Secondary | ICD-10-CM | POA: Insufficient documentation

## 2020-09-20 DIAGNOSIS — Z7984 Long term (current) use of oral hypoglycemic drugs: Secondary | ICD-10-CM | POA: Diagnosis not present

## 2020-09-20 DIAGNOSIS — Z96652 Presence of left artificial knee joint: Secondary | ICD-10-CM | POA: Insufficient documentation

## 2020-09-20 DIAGNOSIS — J441 Chronic obstructive pulmonary disease with (acute) exacerbation: Secondary | ICD-10-CM | POA: Insufficient documentation

## 2020-09-20 DIAGNOSIS — I2510 Atherosclerotic heart disease of native coronary artery without angina pectoris: Secondary | ICD-10-CM | POA: Insufficient documentation

## 2020-09-20 DIAGNOSIS — E1140 Type 2 diabetes mellitus with diabetic neuropathy, unspecified: Secondary | ICD-10-CM | POA: Diagnosis not present

## 2020-09-20 DIAGNOSIS — R0602 Shortness of breath: Secondary | ICD-10-CM | POA: Diagnosis present

## 2020-09-20 DIAGNOSIS — Z7951 Long term (current) use of inhaled steroids: Secondary | ICD-10-CM | POA: Insufficient documentation

## 2020-09-20 DIAGNOSIS — R059 Cough, unspecified: Secondary | ICD-10-CM

## 2020-09-20 LAB — COMPREHENSIVE METABOLIC PANEL
ALT: 23 U/L (ref 0–44)
AST: 21 U/L (ref 15–41)
Albumin: 4.5 g/dL (ref 3.5–5.0)
Alkaline Phosphatase: 87 U/L (ref 38–126)
Anion gap: 7 (ref 5–15)
BUN: 14 mg/dL (ref 6–20)
CO2: 27 mmol/L (ref 22–32)
Calcium: 9.6 mg/dL (ref 8.9–10.3)
Chloride: 107 mmol/L (ref 98–111)
Creatinine, Ser: 0.7 mg/dL (ref 0.61–1.24)
GFR, Estimated: >60 mL/min (ref >60)
Glucose, Bld: 103 mg/dL — ABNORMAL HIGH (ref 70–99)
Potassium: 4 mmol/L (ref 3.5–5.1)
Sodium: 141 mmol/L (ref 135–145)
Total Bilirubin: 0.9 mg/dL (ref 0.3–1.2)
Total Protein: 8 g/dL (ref 6.5–8.1)

## 2020-09-20 LAB — BRAIN NATRIURETIC PEPTIDE: B Natriuretic Peptide: 14.2 pg/mL (ref 0.0–100.0)

## 2020-09-20 LAB — CBC WITH DIFFERENTIAL/PLATELET
Abs Immature Granulocytes: 0.01 10*3/uL (ref 0.00–0.07)
Basophils Absolute: 0 10*3/uL (ref 0.0–0.1)
Basophils Relative: 0 %
Eosinophils Absolute: 0.3 10*3/uL (ref 0.0–0.5)
Eosinophils Relative: 4 %
HCT: 48.4 % (ref 39.0–52.0)
Hemoglobin: 15 g/dL (ref 13.0–17.0)
Immature Granulocytes: 0 %
Lymphocytes Relative: 41 %
Lymphs Abs: 2.5 10*3/uL (ref 0.7–4.0)
MCH: 27.6 pg (ref 26.0–34.0)
MCHC: 31 g/dL (ref 30.0–36.0)
MCV: 89.1 fL (ref 80.0–100.0)
Monocytes Absolute: 0.5 10*3/uL (ref 0.1–1.0)
Monocytes Relative: 8 %
Neutro Abs: 2.9 10*3/uL (ref 1.7–7.7)
Neutrophils Relative %: 47 %
Platelets: 239 10*3/uL (ref 150–400)
RBC: 5.43 MIL/uL (ref 4.22–5.81)
RDW: 12.7 % (ref 11.5–15.5)
WBC: 6.2 10*3/uL (ref 4.0–10.5)
nRBC: 0 % (ref 0.0–0.2)

## 2020-09-20 LAB — RESP PANEL BY RT-PCR (FLU A&B, COVID) ARPGX2
Influenza A by PCR: NEGATIVE
Influenza B by PCR: NEGATIVE
SARS Coronavirus 2 by RT PCR: NEGATIVE

## 2020-09-20 LAB — D-DIMER, QUANTITATIVE: D-Dimer, Quant: 0.27 ug/mL-FEU (ref 0.00–0.50)

## 2020-09-20 LAB — TROPONIN I (HIGH SENSITIVITY)
Troponin I (High Sensitivity): 3 ng/L (ref <18)
Troponin I (High Sensitivity): 3 ng/L (ref <18)

## 2020-09-20 LAB — LACTIC ACID, PLASMA
Lactic Acid, Venous: 1.5 mmol/L (ref 0.5–1.9)
Lactic Acid, Venous: 1.2 mmol/L (ref 0.5–1.9)

## 2020-09-20 MED ORDER — ALBUTEROL SULFATE HFA 108 (90 BASE) MCG/ACT IN AERS
2.0000 | INHALATION_SPRAY | Freq: Once | RESPIRATORY_TRACT | Status: AC
  Administered 2020-09-20: 2 via RESPIRATORY_TRACT
  Filled 2020-09-20: qty 6.7

## 2020-09-20 MED ORDER — PREDNISONE 50 MG PO TABS: 50.0000 mg | ORAL_TABLET | Freq: Every day | ORAL | 0 refills | Status: AC

## 2020-09-20 MED ORDER — METHYLPREDNISOLONE SODIUM SUCC 125 MG IJ SOLR
125.0000 mg | Freq: Once | INTRAMUSCULAR | Status: AC
  Administered 2020-09-20: 125 mg via INTRAVENOUS
  Filled 2020-09-20: qty 2

## 2020-09-20 MED ORDER — ALBUTEROL SULFATE HFA 108 (90 BASE) MCG/ACT IN AERS: 2.0000 | INHALATION_SPRAY | RESPIRATORY_TRACT | Status: DC | PRN

## 2020-09-20 MED ORDER — MAGNESIUM SULFATE 2 GM/50ML IV SOLN
2.0000 g | Freq: Once | INTRAVENOUS | Status: AC
  Administered 2020-09-20: 2 g via INTRAVENOUS
  Filled 2020-09-20: qty 50

## 2020-09-20 MED ORDER — IOHEXOL 350 MG/ML SOLN
100.0000 mL | Freq: Once | INTRAVENOUS | Status: AC | PRN
  Administered 2020-09-20: 60 mL via INTRAVENOUS

## 2020-09-20 NOTE — ED Triage Notes (Addendum)
Per EMS, pt complains of wheezing, SOB, cough x 2 days. Hx COPD, no known COVID exposure. Inhaler provided temporary relief. He is deaf.  O2 96% HR 110 BP 154/94 CBG 99 Temp 98

## 2020-09-20 NOTE — ED Notes (Signed)
Pt ambulated with pulse ox in the room. Pt's O2 saturation remained 93-100% while ambulating

## 2020-09-20 NOTE — ED Provider Notes (Signed)
[facility] DEPT Provider Note   CSN: [id number] Arrival date & time: 09/20/20  1034     History Chief Complaint  Patient presents with   Shortness of Breath    Azarius Lambson is a 55 y.o. male.  The history is provided by the patient and medical records. No language interpreter was used.  Shortness of Breath Severity:  Severe Onset quality:  Gradual Duration:  4 days Timing:  Constant Progression:  Waxing and waning Chronicity:  Recurrent Context: URI   Relieved by:  Nothing Worsened by:  Exertion Ineffective treatments:  Inhaler Associated symptoms: chest pain (tightness), cough, sputum production and wheezing   Associated symptoms: no abdominal pain, no claudication, no diaphoresis, no fever, no headaches, no hemoptysis, no neck pain, no rash and no vomiting   Risk factors: no hx of PE/DVT       Past Medical History:  Diagnosis Date   Arthritis    Coronary artery disease    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    bilateral some slight hearing in left   Diabetes mellitus without complication (HCC)    not on meds cks blood sugar weekly   Emphysema lung ([place]) 03/2014   on CT of chest   HLD (hyperlipidemia)    Myocardial infarction [facility])    2014   Pulmonary nodule 03/2014   on CT of chest   Stroke [facility])    denies   Tobacco abuse     Patient Active Problem List   Diagnosis Date Noted   Type 2 diabetes mellitus with other specified complication ([place]) 97/35/3299   Prediabetes 04/12/2020   Neck pain on right side 04/12/2020   Rhomboid muscle pain 04/12/2020   MDD (major depressive disorder), severe ([place]) 01/08/2020   Chronic bronchitis ([place]) 10/24/2018   Emphysema lung ([place]) 07/12/2016   Tobacco use disorder 11/24/2014   CAD (coronary artery disease) 11/03/2014   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 10/07/2014   Neuropathy 03/24/2014   Degenerative arthritis of knee 03/17/2014    Past Surgical History:  Procedure  Laterality Date   CARDIAC CATHETERIZATION  11/14   stent placement   EXAM UNDER ANESTHESIA WITH MANIPULATION OF KNEE Left 04/21/2014   Procedure: EXAM UNDER ANESTHESIA WITH MANIPULATION OF KNEE;  Surgeon: Meredith Pel, MD;  Location: [facility];  Service: Orthopedics;  Laterality: Left;  LEFT KNEE MANIPULATION UNDER ANESTHESIA   KNEE ARTHROSCOPY Left 1984   NECK SURGERY  10/14   TOTAL KNEE ARTHROPLASTY Left 03/17/2014   Procedure: TOTAL KNEE ARTHROPLASTY;  Surgeon: Meredith Pel, MD;  Location: [facility];  Service: Orthopedics;  Laterality: Left;       Family History  Problem Relation Age of Onset   Diabetes Mother    Osteoarthritis Mother    Kidney disease Other    Alcohol abuse Brother    Colon cancer Neg Hx    Esophageal cancer Neg Hx    Pancreatic cancer Neg Hx    Rectal cancer Neg Hx    Stomach cancer Neg Hx     Social History   Tobacco Use   Smoking status: Light Smoker    Packs/day: 0.50    Years: 33.00    Pack years: 16.50    Types: Cigarettes   Smokeless tobacco: Never  Vaping Use   Vaping Use: Never used  Substance Use Topics   Alcohol use: Not Currently    Alcohol/week: 0.0 standard drinks   Drug use: Not Currently  Types: Cocaine    Comment: no IV drug use    Home Medications Prior to Admission medications   Medication Sig Start Date End Date Taking? Authorizing Provider  albuterol (VENTOLIN HFA) 108 (90 Base) MCG/ACT inhaler Inhale 2 puffs into the lungs every 6 (six) hours as needed for wheezing or shortness of breath. 04/12/20   Lesleigh Noe, MD  atorvastatin (LIPITOR) 40 MG tablet Take 1 tablet (40 mg total) by mouth daily. 04/12/20   Lesleigh Noe, MD  metFORMIN (GLUCOPHAGE) 500 MG tablet Take 1 tablet (500 mg total) by mouth 2 (two) times daily with a meal. 07/08/20   Lesleigh Noe, MD  SYMBICORT 160-4.5 MCG/ACT inhaler INHALE 2 PUFFS INTO THE LUNGS TWICE DAILY 07/02/20   Lesleigh Noe, MD    Allergies    Patient has no known  allergies.  Review of Systems   Review of Systems  Constitutional:  Positive for fatigue. Negative for diaphoresis and fever.  HENT:  Positive for congestion.   Respiratory:  Positive for cough, sputum production, chest tightness, shortness of breath and wheezing. Negative for hemoptysis and stridor.   Cardiovascular:  Positive for chest pain (tightness). Negative for palpitations, claudication and leg swelling.  Gastrointestinal:  Negative for abdominal pain, constipation, diarrhea, nausea and vomiting.  Genitourinary:  Negative for flank pain.  Musculoskeletal:  Negative for back pain and neck pain.  Skin:  Negative for rash and wound.  Neurological:  Negative for dizziness, light-headedness, numbness and headaches.  Psychiatric/Behavioral:  Negative for agitation and confusion.   All other systems reviewed and are negative.  Physical Exam Updated Vital Signs BP (!) 141/88   Pulse 76   Temp 97.9 F (36.6 C) (Oral)   Resp 17   SpO2 99%   Physical Exam Vitals and nursing note reviewed.  Constitutional:      General: He is not in acute distress.    Appearance: He is well-developed. He is ill-appearing. He is not toxic-appearing or diaphoretic.  HENT:     Head: Normocephalic and atraumatic.  Eyes:     Conjunctiva/sclera: Conjunctivae normal.  Cardiovascular:     Rate and Rhythm: Regular rhythm. Tachycardia present.     Heart sounds: No murmur heard. Pulmonary:     Effort: Pulmonary effort is normal. Tachypnea present. No respiratory distress.     Breath sounds: Wheezing and rhonchi present. No rales.  Chest:     Chest wall: No tenderness.  Abdominal:     Palpations: Abdomen is soft.     Tenderness: There is no abdominal tenderness.  Musculoskeletal:     Cervical back: Neck supple.     Right lower leg: No tenderness. No edema.     Left lower leg: No tenderness. No edema.  Skin:    General: Skin is warm and dry.     Capillary Refill: Capillary refill takes less than 2  seconds.  Neurological:     General: No focal deficit present.     Mental Status: He is alert.  Psychiatric:        Mood and Affect: Mood normal.    ED Results / Procedures / Treatments   Labs (all labs ordered are listed, but only abnormal results are displayed) Labs Reviewed  COMPREHENSIVE METABOLIC PANEL - Abnormal; Notable for the following components:      Result Value   Glucose, Bld 103 (*)    All other components within normal limits  RESP PANEL BY RT-PCR (FLU A&B, COVID) ARPGX2  CBC  WITH DIFFERENTIAL/PLATELET  LACTIC ACID, PLASMA  LACTIC ACID, PLASMA  D-DIMER, QUANTITATIVE  BRAIN NATRIURETIC PEPTIDE  TROPONIN I (HIGH SENSITIVITY)  TROPONIN I (HIGH SENSITIVITY)    EKG EKG Interpretation  Date/Time:  Monday September 20 2020 15:48:28 EDT Ventricular Rate:  79 PR Interval:  156 QRS Duration: 89 QT Interval:  394 QTC Calculation: 452 R Axis:   63 Text Interpretation: Sinus arrhythmia Minimal ST depression, inferior leads No prior ECG for comparison. No STEMI Confirmed by Antony Blackbird [phone]) on 09/20/2020 4:55:02 PM  Radiology DG Chest 2 View  Result Date: 09/20/2020 CLINICAL DATA:  Shortness of breath, cough. EXAM: CHEST - 2 VIEW COMPARISON:  March 05, 2017. FINDINGS: The heart size and mediastinal contours are within normal limits. Left lung is clear. Right apical nodular density is noted. The visualized skeletal structures are unremarkable. IMPRESSION: Right apical nodular density is noted; CT scan of the chest is recommended evaluate for possible pulmonary nodule. Electronically Signed   By: Marijo Conception M.D.   On: 09/20/2020 11:51   CT Chest W Contrast  Result Date: 09/20/2020 CLINICAL DATA:  Respiratory illness, nondiagnostic xray CT recommended to further characterize nodule versus infection versus other cause of cough and shortness of breath and chest tightness. EXAM: CT CHEST WITH CONTRAST TECHNIQUE: Multidetector CT imaging of the chest was performed  during intravenous contrast administration. CONTRAST:  86mL OMNIPAQUE IOHEXOL 350 MG/ML SOLN COMPARISON:  Chest radiograph earlier today.  Chest CT 03/12/2014 FINDINGS: Cardiovascular: Thyroid is normal in size. There are coronary artery calcifications. Thoracic aorta is normal in caliber. No evidence of acute aortic findings or dissection. Conventional branching pattern from the aortic arch. Minimal aortic atherosclerosis. Can not assess for pulmonary embolus given phase of contrast timing. There is no pericardial effusion. Mediastinum/Nodes: Small hiatal hernia. Prominent right hilar nodes measuring 9 and 11 mm. Scattered mediastinal lymph nodes which are not enlarged by size criteria. No visualized thyroid nodule. No axillary adenopathy. Lungs/Pleura: Moderate emphysema. There is no right upper lobe pulmonary nodule or mass corresponding to that seen on radiograph earlier today. There is moderate central bronchial thickening. There is motion artifact in the right lower lobe in the area of previous 4 mm pulmonary nodule, no definite nodule is seen on the current exam. Allowing for motion, no new pulmonary nodule is seen. No pneumonia or focal airspace disease. Findings of pulmonary edema. No pleural effusion. Upper Abdomen: No acute or unexpected finding. Musculoskeletal: Mild thoracic spondylosis. There are no acute or suspicious osseous abnormalities. IMPRESSION: 1. No right upper lobe pulmonary nodule or mass corresponding to that questioned on radiograph earlier today. 2. There is motion artifact in the right lower lobe in the area of previous 4 mm pulmonary nodule, no definite nodule is seen on the current exam. 3. Moderate emphysema. Bronchial thickening. No evidence of pneumonia. 4. Prominent right hilar nodes are likely reactive. 5. Coronary artery calcifications. 6. Small hiatal hernia. Aortic Atherosclerosis (ICD10-I70.0) and Emphysema (ICD10-J43.9). Electronically Signed   By: Keith Rake M.D.    On: 09/20/2020 19:21    Procedures Procedures   Medications Ordered in ED Medications  albuterol (VENTOLIN HFA) 108 (90 Base) MCG/ACT inhaler 2 puff (has no administration in time range)  methylPREDNISolone sodium succinate (SOLU-MEDROL) 125 mg/2 mL injection 125 mg (125 mg Intravenous Given 09/20/20 1708)  albuterol (VENTOLIN HFA) 108 (90 Base) MCG/ACT inhaler 2 puff (2 puffs Inhalation Given 09/20/20 1641)  magnesium sulfate IVPB 2 g 50 mL (0 g Intravenous Stopped 09/20/20 1818)  iohexol (OMNIPAQUE) 350 MG/ML injection 100 mL (60 mLs Intravenous Contrast Given 09/20/20 1843)    ED Course  I have reviewed the triage vital signs and the nursing notes.  Pertinent labs & imaging results that were available during my care of the patient were reviewed by me and considered in my medical decision making (see chart for details).    MDM Rules/Calculators/A&P                           Cornel Werber is a 55 y.o. male with a past medical history significant for COPD/emphysema, CAD with prior MI, stroke, diabetes, hyperlipidemia, previous pulmonary nodule, and deafness who presents with worsening shortness of breath, cough, and fatigue.  Patient reports that a roommate and his 5 person apartment returned from [place] last week and was found to be COVID-positive.  Patient says that he is concerned he could have COVID but is more concerned about his wheezing and breathing.  He reports that he is use inhaler at home without much relief.  He reports chest tightness and some discomfort.  He reports generalized fatigue and malaise.  He reports some congestion and a productive cough with a brown sputum.  He reports soreness and diffuse discomfort with myalgias.  Denies any new leg pain or leg swelling otherwise.  Denies nausea, vomiting, constipation, or diarrhea.  Denies urinary changes.  Denies trauma.  An ASL interpreter was used for all conversations initially.  On exam, patient does have significant  wheezing throughout lung fields as well as some rhonchi.  No significant rales.  Chest and abdomen nontender.  Patient was tachycardic on my initial evaluation but was not hypoxic.  When he was animated in ASL conversation, his ox saturations did dip into the low 90s.  Will have patient ambulated with pulse oximetry to further assess.  Clinically I am concerned the patient has an infectious cause of his likely COPD exacerbation.  With his COVID exposure will test for COVID and flu.  We will give the patient some albuterol initially as well as steroids and magnesium due to the amount of wheezing he is currently going through.  If COVID test returns negative, will give a DuoNeb.  Chest x-ray was obtained in triage and does show an abnormal appearing nodule.  When I compared this to readings from previous imaging it appears to be in a different area.  We will get a D-dimer given the patient's tachycardia and chest tightness and shortness of breath and if it is negative, will get a CT with contrast, if it is positive, will get a CT PE study if his labs come tolerated.  Anticipate reassessment after work-up to determine disposition.  10:45 PM Patient's work-up returned and COVID and flu are negative.  Chest x-ray showed a nodule so the CT was ordered and did not show evidence of pneumonia or nodule.  No significant abnormality discovered.  Lactic acid negative x2, D-dimer negative.  Troponin negative x2.  BNP normal.  Other labs reassuring.  Patient was reassessed and his wheezing has significant improved after albuterol, steroids, and magnesium.  We ambulated patient and he did not get hypoxic and so we feel he is safe for discharge home.  We we will give the patient prescription for steroids and he will follow-up with PCP.  Patient agreed with plan of care and will be discharged.   Final Clinical Impression(s) / ED Diagnoses Final diagnoses:  COPD exacerbation ([place])  Wheezing  Cough    Rx / DC  Orders ED Discharge Orders          Ordered    predniSONE (DELTASONE) 50 MG tablet  Daily        09/20/20 2249           Clinical Impression: 1. COPD exacerbation ([place])   2. Wheezing   3. Cough     Disposition: Discharge  Condition: Good  I have discussed the results, Dx and Tx plan with the pt(& family if present). He/she/they expressed understanding and agree(s) with the plan. Discharge instructions discussed at great length. Strict return precautions discussed and pt &/or family have verbalized understanding of the instructions. No further questions at time of discharge.    New Prescriptions   PREDNISONE (DELTASONE) 50 MG TABLET    Take 1 tablet (50 mg total) by mouth daily for 1 day.    Follow Up: [place] [phone] Schedule an appointment as soon as possible for a visit    [facility] DEPT [place] [id number] [place]       Donel Osowski, Gwenyth Allegra, MD 09/20/20 2252

## 2020-09-20 NOTE — Discharge Instructions (Signed)
Your work-up today is consistent with a wheezing or COPD exacerbation.  The CT imaging did not show evidence of acute pneumonia or any nodules seen.  Your labs were negative for blood clot and your cardiac enzymes were negative both times we checked them.  Your oxygen remained reassuring on reassessment after wheezing improved with the steroids, magnesium, and albuterol.  Please take the inhaler and please use the steroids for the next 4 days starting tomorrow.  You received today's dose already.  Please follow-up with your primary doctor.  Please rest and stay hydrated.  If any symptoms change or worsen acutely, please return to the nearest emergency department.

## 2020-09-27 ENCOUNTER — Emergency Department (HOSPITAL_COMMUNITY)
Admission: EM | Admit: 2020-09-27 | Discharge: 2020-09-27 | Disposition: A | Discharge status: Home / Self Care | Payer: Medicare Other | Attending: Emergency Medicine | Admitting: Emergency Medicine

## 2020-09-27 ENCOUNTER — Other Ambulatory Visit: Payer: Self-pay

## 2020-09-27 ENCOUNTER — Emergency Department (HOSPITAL_COMMUNITY): Payer: Medicare Other

## 2020-09-27 DIAGNOSIS — Z96652 Presence of left artificial knee joint: Secondary | ICD-10-CM | POA: Insufficient documentation

## 2020-09-27 DIAGNOSIS — J441 Chronic obstructive pulmonary disease with (acute) exacerbation: Secondary | ICD-10-CM | POA: Diagnosis not present

## 2020-09-27 DIAGNOSIS — Z7984 Long term (current) use of oral hypoglycemic drugs: Secondary | ICD-10-CM | POA: Diagnosis not present

## 2020-09-27 DIAGNOSIS — Z7951 Long term (current) use of inhaled steroids: Secondary | ICD-10-CM | POA: Diagnosis not present

## 2020-09-27 DIAGNOSIS — Z20822 Contact with and (suspected) exposure to covid-19: Secondary | ICD-10-CM | POA: Diagnosis not present

## 2020-09-27 DIAGNOSIS — I2510 Atherosclerotic heart disease of native coronary artery without angina pectoris: Secondary | ICD-10-CM | POA: Diagnosis not present

## 2020-09-27 DIAGNOSIS — R0602 Shortness of breath: Secondary | ICD-10-CM | POA: Diagnosis present

## 2020-09-27 DIAGNOSIS — F17210 Nicotine dependence, cigarettes, uncomplicated: Secondary | ICD-10-CM | POA: Insufficient documentation

## 2020-09-27 DIAGNOSIS — E119 Type 2 diabetes mellitus without complications: Secondary | ICD-10-CM | POA: Diagnosis not present

## 2020-09-27 DIAGNOSIS — R058 Other specified cough: Secondary | ICD-10-CM

## 2020-09-27 LAB — CBC
HCT: 44.6 % (ref 39.0–52.0)
Hemoglobin: 14.2 g/dL (ref 13.0–17.0)
MCH: 28 pg (ref 26.0–34.0)
MCHC: 31.8 g/dL (ref 30.0–36.0)
MCV: 88 fL (ref 80.0–100.0)
Platelets: 250 10*3/uL (ref 150–400)
RBC: 5.07 MIL/uL (ref 4.22–5.81)
RDW: 12.7 % (ref 11.5–15.5)
WBC: 5.9 10*3/uL (ref 4.0–10.5)
nRBC: 0 % (ref 0.0–0.2)

## 2020-09-27 LAB — BASIC METABOLIC PANEL
Anion gap: 7 (ref 5–15)
BUN: 16 mg/dL (ref 6–20)
CO2: 25 mmol/L (ref 22–32)
Calcium: 9 mg/dL (ref 8.9–10.3)
Chloride: 108 mmol/L (ref 98–111)
Creatinine, Ser: 0.71 mg/dL (ref 0.61–1.24)
GFR, Estimated: >60 mL/min (ref >60)
Glucose, Bld: 137 mg/dL — ABNORMAL HIGH (ref 70–99)
Potassium: 3.6 mmol/L (ref 3.5–5.1)
Sodium: 140 mmol/L (ref 135–145)

## 2020-09-27 LAB — RESP PANEL BY RT-PCR (FLU A&B, COVID) ARPGX2
Influenza A by PCR: NEGATIVE
Influenza B by PCR: NEGATIVE
SARS Coronavirus 2 by RT PCR: NEGATIVE

## 2020-09-27 MED ORDER — PREDNISONE 20 MG PO TABS
60.0000 mg | ORAL_TABLET | Freq: Every day | ORAL | 0 refills | Status: DC
  Filled 2020-09-29: qty 12, 4d supply, fill #0

## 2020-09-27 MED ORDER — AZITHROMYCIN 250 MG PO TABS
250.0000 mg | ORAL_TABLET | Freq: Every day | ORAL | 0 refills | Status: AC
  Filled 2020-09-29: qty 4, 4d supply, fill #0

## 2020-09-27 MED ORDER — IPRATROPIUM BROMIDE 0.02 % IN SOLN
0.5000 mg | Freq: Once | RESPIRATORY_TRACT | Status: AC
  Administered 2020-09-27: 0.5 mg via RESPIRATORY_TRACT
  Filled 2020-09-27: qty 2.5

## 2020-09-27 MED ORDER — ALBUTEROL SULFATE HFA 108 (90 BASE) MCG/ACT IN AERS
2.0000 | INHALATION_SPRAY | RESPIRATORY_TRACT | 1 refills | Status: DC | PRN
  Filled 2020-09-29: qty 18, 16d supply, fill #0

## 2020-09-27 MED ORDER — ALBUTEROL SULFATE (2.5 MG/3ML) 0.083% IN NEBU
5.0000 mg | INHALATION_SOLUTION | Freq: Once | RESPIRATORY_TRACT | Status: AC
  Administered 2020-09-27: 5 mg via RESPIRATORY_TRACT
  Filled 2020-09-27: qty 6

## 2020-09-27 MED ORDER — METHYLPREDNISOLONE SODIUM SUCC 125 MG IJ SOLR: 125.0000 mg | Freq: Once | INTRAMUSCULAR | Status: DC

## 2020-09-27 MED ORDER — AZITHROMYCIN 250 MG PO TABS
500.0000 mg | ORAL_TABLET | Freq: Once | ORAL | Status: AC
  Administered 2020-09-27: 500 mg via ORAL
  Filled 2020-09-27: qty 2

## 2020-09-27 MED ORDER — ALBUTEROL SULFATE (2.5 MG/3ML) 0.083% IN NEBU
5.0000 mg | INHALATION_SOLUTION | Freq: Once | RESPIRATORY_TRACT | Status: DC
  Filled 2020-09-27: qty 6

## 2020-09-27 NOTE — ED Triage Notes (Signed)
Ems brings pt in from home for shortness of breath. Pt has history of COPD. Pt recently seen for same but reports he is not getting better. Pt is deaf.

## 2020-09-27 NOTE — ED Provider Notes (Signed)
[facility] DEPT Provider Note   CSN: [id number] Arrival date & time: 09/27/20  1158     History Chief Complaint  Patient presents with   Shortness of Breath    Alan Mendoza is a 55 y.o. male.  Patient c/o sob in past 1-2 weeks. Symptoms gradual onset, constant, mod-severe, worsening. +non prod cough and wheezing. No sore throat or runny nose. No fever/chills. No known covid or flu exposure. Is vaccinated and boosted for covid. +smoker. Denies leg pain or swelling. No orthopnea or pnd. Did not get recent steroid rx filled. Denies chest pain or discomfort.   The history is provided by the patient, medical records and the EMS personnel. A language interpreter was used (AV sign language interpreter used.).  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    bilateral some slight hearing in left   Diabetes mellitus without complication ([place])    not on meds cks blood sugar weekly   Emphysema lung ([place]) 03/2014   on CT of chest   HLD (hyperlipidemia)    Myocardial infarction [facility])    2014   Pulmonary nodule 03/2014   on CT of chest   Stroke [facility])    denies   Tobacco abuse     Patient Active Problem List   Diagnosis Date Noted   Type 2 diabetes mellitus with other specified complication ([place]) 53/29/9242   Prediabetes 04/12/2020   Neck pain on right side 04/12/2020   Rhomboid muscle pain 04/12/2020   MDD (major depressive disorder), severe ([employer]) 01/08/2020   Chronic bronchitis ([place]) 10/24/2018   Emphysema lung ([place]) 07/12/2016   Tobacco use disorder 11/24/2014   CAD (coronary artery disease) 11/03/2014   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 10/07/2014    Neuropathy 03/24/2014   Degenerative arthritis of knee 03/17/2014    Past Surgical History:  Procedure Laterality Date   CARDIAC CATHETERIZATION  11/14   stent placement   EXAM UNDER ANESTHESIA WITH MANIPULATION OF KNEE Left 04/21/2014   Procedure: EXAM UNDER ANESTHESIA WITH MANIPULATION OF KNEE;  Surgeon: Meredith Pel, MD;  Location: [place];  Service: Orthopedics;  Laterality: Left;  LEFT KNEE MANIPULATION UNDER ANESTHESIA   KNEE ARTHROSCOPY Left 1984   NECK SURGERY  10/14   TOTAL KNEE ARTHROPLASTY Left 03/17/2014   Procedure: TOTAL KNEE ARTHROPLASTY;  Surgeon: Meredith Pel, MD;  Location: [place];  Service: Orthopedics;  Laterality: Left;       Family History  Problem Relation Age of Onset   Diabetes Mother    Osteoarthritis Mother    Kidney disease Other    Alcohol abuse Brother    Colon cancer Neg Hx    Esophageal cancer Neg Hx    Pancreatic cancer Neg Hx    Rectal cancer Neg Hx    Stomach cancer Neg Hx     Social History   Tobacco Use   Smoking status: Light Smoker    Packs/day: 0.50    Years: 33.00    Pack years: 16.50    Types: Cigarettes   Smokeless tobacco: Never  Vaping Use   Vaping Use: Never used  Substance Use Topics  Alcohol use: Not Currently    Alcohol/week: 0.0 standard drinks   Drug use: Not Currently    Types: Cocaine    Comment: no IV drug use    Home Medications Prior to Admission medications   Medication Sig Start Date End Date Taking? Authorizing Provider  albuterol (VENTOLIN HFA) 108 (90 Base) MCG/ACT inhaler Inhale 2 puffs into the lungs every 6 (six) hours as needed for wheezing or shortness of breath. 04/12/20   Lesleigh Noe, MD  atorvastatin (LIPITOR) 40 MG tablet Take 1 tablet (40 mg total) by mouth daily. 04/12/20   Lesleigh Noe, MD  metFORMIN (GLUCOPHAGE) 500 MG tablet Take 1 tablet (500 mg total) by mouth 2 (two) times daily with a meal. 07/08/20   Lesleigh Noe, MD  SYMBICORT 160-4.5 MCG/ACT inhaler INHALE 2 PUFFS  INTO THE LUNGS TWICE DAILY 07/02/20   Lesleigh Noe, MD    Allergies    Patient has no known allergies.  Review of Systems   Review of Systems  Constitutional:  Negative for chills, diaphoresis and fever.  HENT:  Negative for sore throat.   Eyes:  Negative for redness.  Respiratory:  Positive for cough, shortness of breath and wheezing.   Cardiovascular:  Negative for chest pain, palpitations and leg swelling.  Gastrointestinal:  Negative for abdominal pain and vomiting.  Genitourinary:  Negative for flank pain.  Musculoskeletal:  Negative for back pain and neck pain.  Skin:  Negative for rash.  Neurological:  Negative for headaches.  Hematological:  Does not bruise/bleed easily.  Psychiatric/Behavioral:  Negative for confusion.    Physical Exam Updated Vital Signs BP 138/83   Pulse 97   Temp 98.1 F (36.7 C) (Oral)   Resp 20   Ht 1.702 m (5\' 7" )   Wt 72.6 kg   SpO2 96%   BMI 25.06 kg/m   Physical Exam Vitals and nursing note reviewed.  Constitutional:      Appearance: Normal appearance. He is well-developed.  HENT:     Head: Atraumatic.     Nose: Nose normal.     Mouth/Throat:     Mouth: Mucous membranes are moist.     Pharynx: Oropharynx is clear.  Eyes:     General: No scleral icterus.    Conjunctiva/sclera: Conjunctivae normal.  Neck:     Trachea: No tracheal deviation.  Cardiovascular:     Rate and Rhythm: Normal rate and regular rhythm.     Pulses: Normal pulses.     Heart sounds: Normal heart sounds. No murmur heard.   No friction rub. No gallop.  Pulmonary:     Effort: Pulmonary effort is normal. No accessory muscle usage or respiratory distress.     Breath sounds: Wheezing present.  Abdominal:     General: Bowel sounds are normal. There is no distension.     Palpations: Abdomen is soft.     Tenderness: There is no abdominal tenderness.  Genitourinary:    Comments: No cva tenderness. Musculoskeletal:        General: No swelling or tenderness.      Cervical back: Normal range of motion and neck supple. No rigidity.     Right lower leg: No edema.     Left lower leg: No edema.  Skin:    General: Skin is warm and dry.     Findings: No rash.  Neurological:     Mental Status: He is alert.     Comments: Alert, communicated via sign language interpreter  Psychiatric:        Mood and Affect: Mood normal.    ED Results / Procedures / Treatments   Labs (all labs ordered are listed, but only abnormal results are displayed) Results for orders placed or performed during the hospital encounter of 09/27/20  Resp Panel by RT-PCR (Flu A&B, Covid) Nasopharyngeal Swab   Specimen: Nasopharyngeal Swab; Nasopharyngeal(NP) swabs in vial transport medium  Result Value Ref Range   SARS Coronavirus 2 by RT PCR NEGATIVE NEGATIVE   Influenza A by PCR NEGATIVE NEGATIVE   Influenza B by PCR NEGATIVE NEGATIVE  Basic metabolic panel  Result Value Ref Range   Sodium 140 135 - 145 mmol/L   Potassium 3.6 3.5 - 5.1 mmol/L   Chloride 108 98 - 111 mmol/L   CO2 25 22 - 32 mmol/L   Glucose, Bld 137 (H) 70 - 99 mg/dL   BUN 16 6 - 20 mg/dL   Creatinine, Ser 0.71 0.61 - 1.24 mg/dL   Calcium 9.0 8.9 - 10.3 mg/dL   GFR, Estimated >60 >60 mL/min   Anion gap 7 5 - 15  CBC  Result Value Ref Range   WBC 5.9 4.0 - 10.5 K/uL   RBC 5.07 4.22 - 5.81 MIL/uL   Hemoglobin 14.2 13.0 - 17.0 g/dL   HCT 44.6 39.0 - 52.0 %   MCV 88.0 80.0 - 100.0 fL   MCH 28.0 26.0 - 34.0 pg   MCHC 31.8 30.0 - 36.0 g/dL   RDW 12.7 11.5 - 15.5 %   Platelets 250 150 - 400 K/uL   nRBC 0.0 0.0 - 0.2 %   DG Chest 2 View  Result Date: 09/20/2020 CLINICAL DATA:  Shortness of breath, cough. EXAM: CHEST - 2 VIEW COMPARISON:  March 05, 2017. FINDINGS: The heart size and mediastinal contours are within normal limits. Left lung is clear. Right apical nodular density is noted. The visualized skeletal structures are unremarkable. IMPRESSION: Right apical nodular density is noted; CT scan of the chest  is recommended evaluate for possible pulmonary nodule. Electronically Signed   By: Marijo Conception M.D.   On: 09/20/2020 11:51   CT Chest W Contrast  Result Date: 09/20/2020 CLINICAL DATA:  Respiratory illness, nondiagnostic xray CT recommended to further characterize nodule versus infection versus other cause of cough and shortness of breath and chest tightness. EXAM: CT CHEST WITH CONTRAST TECHNIQUE: Multidetector CT imaging of the chest was performed during intravenous contrast administration. CONTRAST:  75mL OMNIPAQUE IOHEXOL 350 MG/ML SOLN COMPARISON:  Chest radiograph earlier today.  Chest CT 03/12/2014 FINDINGS: Cardiovascular: Thyroid is normal in size. There are coronary artery calcifications. Thoracic aorta is normal in caliber. No evidence of acute aortic findings or dissection. Conventional branching pattern from the aortic arch. Minimal aortic atherosclerosis. Can not assess for pulmonary embolus given phase of contrast timing. There is no pericardial effusion. Mediastinum/Nodes: Small hiatal hernia. Prominent right hilar nodes measuring 9 and 11 mm. Scattered mediastinal lymph nodes which are not enlarged by size criteria. No visualized thyroid nodule. No axillary adenopathy. Lungs/Pleura: Moderate emphysema. There is no right upper lobe pulmonary nodule or mass corresponding to that seen on radiograph earlier today. There is moderate central bronchial thickening. There is motion artifact in the right lower lobe in the area of previous 4 mm pulmonary nodule, no definite nodule is seen on the current exam. Allowing for motion, no new pulmonary nodule is seen. No pneumonia or focal airspace disease. Findings of pulmonary edema. No pleural effusion. Upper  Abdomen: No acute or unexpected finding. Musculoskeletal: Mild thoracic spondylosis. There are no acute or suspicious osseous abnormalities. IMPRESSION: 1. No right upper lobe pulmonary nodule or mass corresponding to that questioned on radiograph  earlier today. 2. There is motion artifact in the right lower lobe in the area of previous 4 mm pulmonary nodule, no definite nodule is seen on the current exam. 3. Moderate emphysema. Bronchial thickening. No evidence of pneumonia. 4. Prominent right hilar nodes are likely reactive. 5. Coronary artery calcifications. 6. Small hiatal hernia. Aortic Atherosclerosis (ICD10-I70.0) and Emphysema (ICD10-J43.9). Electronically Signed   By: Keith Rake M.D.   On: 09/20/2020 19:21    EKG None  Radiology DG Chest Port 1 View  Result Date: 09/27/2020 CLINICAL DATA:  Shortness of breath EXAM: PORTABLE CHEST 1 VIEW COMPARISON:  Chest x-ray dated September 20, 2020 FINDINGS: The heart size and mediastinal contours are within normal limits. Mild bibasilar opacities, likely due to atelectasis. No focal consolidation. The visualized skeletal structures are unremarkable. IMPRESSION: Mild bibasilar opacities, likely due to atelectasis. Electronically Signed   By: Yetta Glassman M.D.   On: 09/27/2020 13:34    Procedures Procedures   Medications Ordered in ED Medications  albuterol (PROVENTIL) (2.5 MG/3ML) 0.083% nebulizer solution 5 mg (has no administration in time range)  ipratropium (ATROVENT) nebulizer solution 0.5 mg (has no administration in time range)  methylPREDNISolone sodium succinate (SOLU-MEDROL) 125 mg/2 mL injection 125 mg (has no administration in time range)    ED Course  I have reviewed the triage vital signs and the nursing notes.  Pertinent labs & imaging results that were available during my care of the patient were reviewed by me and considered in my medical decision making (see chart for details).    MDM Rules/Calculators/A&P                           Iv ns. Continuous pulse ox and cardiac monitoring. Stat labs. Imaging.   Reviewed nursing notes and prior charts for additional history.   Albuterol and atrovent tx. Solumedrol iv.   CXR reviewed/interpreted by me - no  pna.  Labs reviewed/interpreted by me - chem normal.   Additional albuterol neb txs as continued wheezing.   Given recent cough, hx copd, will rx abx.   Zithromax po. Additional neb tx.   Recheck, pt breathing comfortably. Sats 94% room air. Improved air movement, dec wheezing, no distress.        Final Clinical Impression(s) / ED Diagnoses Final diagnoses:  None    Rx / DC Orders ED Discharge Orders     None        Lajean Saver, MD 09/27/20 [phone]

## 2020-09-27 NOTE — Discharge Planning (Signed)
RNCM consulted in regards to medication assistance.  Pt has insurance coverage and is not eligible for Medication Assistance Through [facility]) program.

## 2020-09-27 NOTE — Progress Notes (Signed)
CSW notified RN CM of consult for medication assistance.  Madilyn Fireman, MSW, LCSW Transitions of Care  Clinical Social Worker II [phone]

## 2020-09-27 NOTE — Discharge Instructions (Addendum)
It was our pleasure to provide your ER care today - we hope that you feel better.  Take prednisone as prescribed. Use albuterol treatment every 3-4 hours. Avoid any smoking. Take antibiotic as prescribed.   Follow up with primary care doctor in the coming week.  Return to ER if worse, new symptoms, high fevers, chest pain, increased trouble breathing, or other concern.

## 2020-09-29 ENCOUNTER — Other Ambulatory Visit (HOSPITAL_COMMUNITY): Payer: Self-pay

## 2020-10-04 ENCOUNTER — Other Ambulatory Visit: Payer: Medicare Other

## 2020-10-20 ENCOUNTER — Other Ambulatory Visit: Payer: Self-pay

## 2020-10-20 ENCOUNTER — Ambulatory Visit
Admission: RE | Admit: 2020-10-20 | Discharge: 2020-10-20 | Disposition: A | Discharge status: Home / Self Care | Payer: Medicare Other | Source: Ambulatory Visit | Attending: Family Medicine | Admitting: Family Medicine

## 2020-10-20 DIAGNOSIS — C3491 Malignant neoplasm of unspecified part of right bronchus or lung: Secondary | ICD-10-CM

## 2021-01-10 ENCOUNTER — Other Ambulatory Visit: Payer: Self-pay | Admitting: Family Medicine

## 2021-01-10 DIAGNOSIS — M79604 Pain in right leg: Secondary | ICD-10-CM

## 2021-01-14 ENCOUNTER — Emergency Department (HOSPITAL_COMMUNITY): Payer: Medicare Other

## 2021-01-14 ENCOUNTER — Encounter (HOSPITAL_COMMUNITY): Payer: Self-pay

## 2021-01-14 ENCOUNTER — Emergency Department (HOSPITAL_COMMUNITY)
Admission: EM | Admit: 2021-01-14 | Discharge: 2021-01-15 | Disposition: A | Discharge status: Home / Self Care | Payer: Medicare Other | Attending: Emergency Medicine | Admitting: Emergency Medicine

## 2021-01-14 ENCOUNTER — Other Ambulatory Visit: Payer: Self-pay

## 2021-01-14 DIAGNOSIS — Z7951 Long term (current) use of inhaled steroids: Secondary | ICD-10-CM | POA: Diagnosis not present

## 2021-01-14 DIAGNOSIS — J441 Chronic obstructive pulmonary disease with (acute) exacerbation: Secondary | ICD-10-CM | POA: Diagnosis not present

## 2021-01-14 DIAGNOSIS — Z20822 Contact with and (suspected) exposure to covid-19: Secondary | ICD-10-CM | POA: Diagnosis not present

## 2021-01-14 DIAGNOSIS — J069 Acute upper respiratory infection, unspecified: Secondary | ICD-10-CM | POA: Insufficient documentation

## 2021-01-14 DIAGNOSIS — R0602 Shortness of breath: Secondary | ICD-10-CM | POA: Diagnosis present

## 2021-01-14 LAB — RESP PANEL BY RT-PCR (FLU A&B, COVID) ARPGX2
Influenza A by PCR: NEGATIVE
Influenza B by PCR: NEGATIVE
SARS Coronavirus 2 by RT PCR: NEGATIVE

## 2021-01-14 MED ORDER — ALBUTEROL SULFATE (2.5 MG/3ML) 0.083% IN NEBU
2.5000 mg | INHALATION_SOLUTION | Freq: Once | RESPIRATORY_TRACT | Status: AC
  Administered 2021-01-14: 2.5 mg via RESPIRATORY_TRACT
  Filled 2021-01-14: qty 3

## 2021-01-14 MED ORDER — PREDNISONE 20 MG PO TABS
60.0000 mg | ORAL_TABLET | Freq: Once | ORAL | Status: AC
  Administered 2021-01-14: 60 mg via ORAL
  Filled 2021-01-14: qty 3

## 2021-01-14 NOTE — ED Triage Notes (Addendum)
Per EMS- Patient is from home. Patient lives in a home where everyone has been sick since December with flu -like symptoms.  Patient c/o cough and SOB x 3 weeks. Patient was given Albuterol 10 and Atrovent via neb due to wheezing in all lobes.   Patient added in triage that he has been having right arm pain x 2-3 months

## 2021-01-14 NOTE — ED Notes (Signed)
Unable to get blood work

## 2021-01-14 NOTE — ED Provider Triage Note (Signed)
Emergency Medicine Provider Triage Evaluation Note  Alan Mendoza , a 56 y.o. male  was evaluated in triage.  Pt complains of cough and runny nose x 3 weeks, trouble breathing, mucous production (yellow/white/clear). History of COPD- emphysema. Given duoneb by EMS, unable to get IV for steroids. O2 sat 96% with EMS. Neb helped, concerned inhalers are not strong enough. Took Mucinex today. Prior admission related to COPD last fall. No prior intubations.  + sick contacts at home, 1 was COVID positive.   Deaf- ASL interpreter used for triage.  Review of Systems  Positive: Cough, difficulty breathing Negative: fever  Physical Exam  There were no vitals taken for this visit. Gen:   Awake, no distress   Resp:  Normal effort  MSK:   Moves extremities without difficulty  Other:  Lung sounds diminished, mild wheezing  Medical Decision Making  Medically screening exam initiated at 5:13 PM.  Appropriate orders placed.  Alan Mendoza was informed that the remainder of the evaluation will be completed by another provider, this initial triage assessment does not replace that evaluation, and the importance of remaining in the ED until their evaluation is complete.     Tacy Learn, PA-C 01/14/21 2213

## 2021-01-15 LAB — CBC WITH DIFFERENTIAL/PLATELET
Abs Immature Granulocytes: 0.05 10*3/uL (ref 0.00–0.07)
Basophils Absolute: 0 10*3/uL (ref 0.0–0.1)
Basophils Relative: 0 %
Eosinophils Absolute: 0 10*3/uL (ref 0.0–0.5)
Eosinophils Relative: 0 %
HCT: 48.6 % (ref 39.0–52.0)
Hemoglobin: 14.8 g/dL (ref 13.0–17.0)
Immature Granulocytes: 1 %
Lymphocytes Relative: 8 %
Lymphs Abs: 0.7 10*3/uL (ref 0.7–4.0)
MCH: 27.8 pg (ref 26.0–34.0)
MCHC: 30.5 g/dL (ref 30.0–36.0)
MCV: 91.2 fL (ref 80.0–100.0)
Monocytes Absolute: 0.1 10*3/uL (ref 0.1–1.0)
Monocytes Relative: 1 %
Neutro Abs: 8.1 10*3/uL — ABNORMAL HIGH (ref 1.7–7.7)
Neutrophils Relative %: 90 %
Platelets: 296 10*3/uL (ref 150–400)
RBC: 5.33 MIL/uL (ref 4.22–5.81)
RDW: 12.7 % (ref 11.5–15.5)
WBC: 9 10*3/uL (ref 4.0–10.5)
nRBC: 0 % (ref 0.0–0.2)

## 2021-01-15 MED ORDER — PREDNISONE 20 MG PO TABS: ORAL_TABLET | ORAL | 0 refills | Status: DC

## 2021-01-15 MED ORDER — AEROCHAMBER PLUS FLO-VU MISC
1.0000 | Freq: Once | Status: AC
  Administered 2021-01-15: 1
  Filled 2021-01-15: qty 1

## 2021-01-15 MED ORDER — ALBUTEROL SULFATE HFA 108 (90 BASE) MCG/ACT IN AERS
2.0000 | INHALATION_SPRAY | Freq: Once | RESPIRATORY_TRACT | Status: AC
  Administered 2021-01-15: 2 via RESPIRATORY_TRACT
  Filled 2021-01-15: qty 6.7

## 2021-01-15 NOTE — ED Provider Notes (Signed)
[facility] DEPT Provider Note   CSN: [id number] Arrival date & time: 01/14/21  1653     History  Chief Complaint  Patient presents with   Cough   Shortness of Breath    Alan Mendoza is a 56 y.o. male.  56 yo M with a chief complaints of cough shortness of breath.  Going on for about 3 weeks now.  Has been using his inhailer for about 3 to 4 days but without much improvement.  Has a history of COPD.  No fevers or chills.  No chest pain or pressure.  No abdominal pain. In triage he mentioned that everyone where he lived was sick but since on my exam tells me that no one is sick where he lives.  The history is provided by the patient.  Cough Associated symptoms: shortness of breath   Associated symptoms: no chest pain, no chills, no eye discharge, no fever, no headaches, no myalgias and no rash   Shortness of Breath Associated symptoms: cough   Associated symptoms: no abdominal pain, no chest pain, no fever, no headaches, no rash and no vomiting   Illness Severity:  Moderate Onset quality:  Gradual Duration:  3 weeks Timing:  Constant Progression:  Worsening Chronicity:  New Associated symptoms: cough and shortness of breath   Associated symptoms: no abdominal pain, no chest pain, no congestion, no diarrhea, no fever, no headaches, no myalgias, no rash and no vomiting       Home Medications Prior to Admission medications   Medication Sig Start Date End Date Taking? Authorizing Provider  predniSONE (DELTASONE) 20 MG tablet 2 tabs po daily x 4 days 01/15/21  Yes Deno Etienne, DO  albuterol (VENTOLIN HFA) 108 (90 Base) MCG/ACT inhaler Inhale 2 puffs into the lungs every 6 (six) hours as needed for wheezing or shortness of breath. 04/12/20   Lesleigh Noe, MD  albuterol (VENTOLIN HFA) 108 (90 Base) MCG/ACT inhaler Inhale 2 puffs into the lungs every 4 (four) hours as needed for wheezing or shortness of breath. 09/27/20   Lajean Saver, MD   atorvastatin (LIPITOR) 40 MG tablet Take 1 tablet (40 mg total) by mouth daily. 04/12/20   Lesleigh Noe, MD  metFORMIN (GLUCOPHAGE) 500 MG tablet Take 1 tablet (500 mg total) by mouth 2 (two) times daily with a meal. 07/08/20   Lesleigh Noe, MD  SYMBICORT 160-4.5 MCG/ACT inhaler INHALE 2 PUFFS INTO THE LUNGS TWICE DAILY Patient not taking: No sig reported 07/02/20   Lesleigh Noe, MD      Allergies    Patient has no known allergies.    Review of Systems   Review of Systems  Constitutional:  Negative for chills and fever.  HENT:  Negative for congestion and facial swelling.   Eyes:  Negative for discharge and visual disturbance.  Respiratory:  Positive for cough and shortness of breath.   Cardiovascular:  Negative for chest pain and palpitations.  Gastrointestinal:  Negative for abdominal pain, diarrhea and vomiting.  Musculoskeletal:  Negative for arthralgias and myalgias.  Skin:  Negative for color change and rash.  Neurological:  Negative for tremors, syncope and headaches.  Psychiatric/Behavioral:  Negative for confusion and dysphoric mood.    Physical Exam Updated Vital Signs BP 131/87    Pulse 100    Temp 98.2 F (36.8 C) (Oral)    Resp 16    Ht 5\' 7"  (1.702 m)    Wt 73.9 kg    SpO2  96%    BMI 25.53 kg/m  Physical Exam Vitals and nursing note reviewed.  Constitutional:      Appearance: He is well-developed.  HENT:     Head: Normocephalic and atraumatic.  Eyes:     Pupils: Pupils are equal, round, and reactive to light.  Neck:     Vascular: No JVD.  Cardiovascular:     Rate and Rhythm: Normal rate and regular rhythm.     Heart sounds: No murmur heard.   No friction rub. No gallop.  Pulmonary:     Effort: No respiratory distress.     Breath sounds: Decreased breath sounds (mildly diminished) present. No wheezing.  Abdominal:     General: There is no distension.     Tenderness: There is no abdominal tenderness. There is no guarding or rebound.  Musculoskeletal:         General: Normal range of motion.     Cervical back: Normal range of motion and neck supple.  Skin:    Coloration: Skin is not pale.     Findings: No rash.  Neurological:     Mental Status: He is alert and oriented to person, place, and time.  Psychiatric:        Behavior: Behavior normal.    ED Results / Procedures / Treatments   Labs (all labs ordered are listed, but only abnormal results are displayed) Labs Reviewed  CBC WITH DIFFERENTIAL/PLATELET - Abnormal; Notable for the following components:      Result Value   Neutro Abs 8.1 (*)    All other components within normal limits  RESP PANEL BY RT-PCR (FLU A&B, COVID) [id number]  BASIC METABOLIC PANEL    EKG EKG Interpretation  Date/Time:  Friday January 14 2021 17:20:31 EST Ventricular Rate:  102 PR Interval:  141 QRS Duration: 86 QT Interval:  350 QTC Calculation: 456 R Axis:   63 Text Interpretation: Sinus tachycardia Minimal ST depression, diffuse leads Since last tracing rate faster Otherwise no significant change Confirmed by [name] [phone]) on 01/14/2021 5:24:18 PM  Radiology DG Chest 2 View  Result Date: 01/14/2021 CLINICAL DATA:  Cough, shortness of breath EXAM: CHEST - 2 VIEW COMPARISON:  09/27/2020 FINDINGS: Cardiac size is within normal limits. There are no signs of pulmonary edema or focal pulmonary consolidation. There is no pleural effusion or pneumothorax. IMPRESSION: No active cardiopulmonary disease. Electronically Signed   By: Elmer Picker M.D.   On: 01/14/2021 18:31    Procedures Procedures    Medications Ordered in ED Medications  aerochamber plus with mask device 1 each (has no administration in time range)  albuterol (VENTOLIN HFA) 108 (90 Base) MCG/ACT inhaler 2 puff (has no administration in time range)  albuterol (PROVENTIL) (2.5 MG/3ML) 0.083% nebulizer solution 2.5 mg (2.5 mg Nebulization Given 01/14/21 1737)  predniSONE (DELTASONE) tablet 60 mg (60 mg Oral Given 01/14/21  1734)    ED Course/ Medical Decision Making/ A&P                           Medical Decision Making  56 yo M with a chief complaint of a cough.  Going on for 3 weeks now.  Feels like he has had some trouble breathing with this.  No tachypnea clear lung sounds for me.  Received albuterol upon arrival and thinks it made him feel a bit better.  We will have him use his inhaler at home every 4 hours while awake.  Steroids.  He had lab work ordered in triage that he had difficulty obtaining a metabolic panel.  I do not feel that 1 is needed.  CBC without significant leukocytosis no significant anemia.  Chest x-ray viewed by me without focal infiltrate.  Will discharge home.  PCP follow-up.  5:55 AM:  I have discussed the diagnosis/risks/treatment options with the patient and believe the pt to be eligible for discharge home to follow-up with PCP. We also discussed returning to the ED immediately if new or worsening sx occur. We discussed the sx which are most concerning (e.g., sudden worsening sob, need to use inhaler more often than every 4 hours, fever, inability to tolerate by mouth) that necessitate immediate return. Medications administered to the patient during their visit and any new prescriptions provided to the patient are listed below.  Medications given during this visit Medications  aerochamber plus with mask device 1 each (has no administration in time range)  albuterol (VENTOLIN HFA) 108 (90 Base) MCG/ACT inhaler 2 puff (has no administration in time range)  albuterol (PROVENTIL) (2.5 MG/3ML) 0.083% nebulizer solution 2.5 mg (2.5 mg Nebulization Given 01/14/21 1737)  predniSONE (DELTASONE) tablet 60 mg (60 mg Oral Given 01/14/21 1734)     The patient appears reasonably screen and/or stabilized for discharge and I doubt any other medical condition or other [facility] requiring further screening, evaluation, or treatment in the ED at this time prior to discharge.          Final Clinical  Impression(s) / ED Diagnoses Final diagnoses:  Viral URI with cough  COPD exacerbation ([place])    Rx / DC Orders ED Discharge Orders          Ordered    predniSONE (DELTASONE) 20 MG tablet        01/15/21 [place], [place], DO 01/15/21 6767

## 2021-01-15 NOTE — Discharge Instructions (Signed)
Use your inhaler every 4 hours(6 puffs) while awake, return for sudden worsening shortness of breath, or if you need to use your inhaler more often.  ° °

## 2021-01-15 NOTE — ED Notes (Addendum)
Unsuccessful x2 attempts at [facility]. First draw was not enough when RN called the lab.

## 2021-01-20 ENCOUNTER — Ambulatory Visit
Admission: RE | Admit: 2021-01-20 | Discharge: 2021-01-20 | Disposition: A | Discharge status: Home / Self Care | Payer: Medicare Other | Source: Ambulatory Visit | Attending: Family Medicine | Admitting: Family Medicine

## 2021-01-20 DIAGNOSIS — M79604 Pain in right leg: Secondary | ICD-10-CM

## 2021-01-27 ENCOUNTER — Encounter (HOSPITAL_COMMUNITY): Payer: Self-pay

## 2021-01-27 ENCOUNTER — Other Ambulatory Visit: Payer: Self-pay

## 2021-01-27 ENCOUNTER — Emergency Department (HOSPITAL_COMMUNITY): Payer: Medicare Other

## 2021-01-27 ENCOUNTER — Inpatient Hospital Stay (HOSPITAL_COMMUNITY)
Admission: EM | Admit: 2021-01-27 | Discharge: 2021-01-30 | DRG: 192 | Disposition: A | Discharge status: Home / Self Care | Payer: Medicare Other | Attending: Internal Medicine | Admitting: Internal Medicine

## 2021-01-27 DIAGNOSIS — Z833 Family history of diabetes mellitus: Secondary | ICD-10-CM

## 2021-01-27 DIAGNOSIS — H9193 Unspecified hearing loss, bilateral: Secondary | ICD-10-CM | POA: Diagnosis present

## 2021-01-27 DIAGNOSIS — R0602 Shortness of breath: Secondary | ICD-10-CM

## 2021-01-27 DIAGNOSIS — G4700 Insomnia, unspecified: Secondary | ICD-10-CM | POA: Diagnosis present

## 2021-01-27 DIAGNOSIS — M1990 Unspecified osteoarthritis, unspecified site: Secondary | ICD-10-CM | POA: Diagnosis present

## 2021-01-27 DIAGNOSIS — Z955 Presence of coronary angioplasty implant and graft: Secondary | ICD-10-CM

## 2021-01-27 DIAGNOSIS — Z7951 Long term (current) use of inhaled steroids: Secondary | ICD-10-CM

## 2021-01-27 DIAGNOSIS — J449 Chronic obstructive pulmonary disease, unspecified: Secondary | ICD-10-CM | POA: Diagnosis present

## 2021-01-27 DIAGNOSIS — Z811 Family history of alcohol abuse and dependence: Secondary | ICD-10-CM

## 2021-01-27 DIAGNOSIS — Z20822 Contact with and (suspected) exposure to covid-19: Secondary | ICD-10-CM | POA: Diagnosis present

## 2021-01-27 DIAGNOSIS — I252 Old myocardial infarction: Secondary | ICD-10-CM

## 2021-01-27 DIAGNOSIS — E1169 Type 2 diabetes mellitus with other specified complication: Secondary | ICD-10-CM | POA: Diagnosis present

## 2021-01-27 DIAGNOSIS — B0689 Other rubella complications: Secondary | ICD-10-CM | POA: Diagnosis present

## 2021-01-27 DIAGNOSIS — Z7984 Long term (current) use of oral hypoglycemic drugs: Secondary | ICD-10-CM

## 2021-01-27 DIAGNOSIS — J441 Chronic obstructive pulmonary disease with (acute) exacerbation: Principal | ICD-10-CM | POA: Diagnosis present

## 2021-01-27 DIAGNOSIS — R000 Tachycardia, unspecified: Secondary | ICD-10-CM | POA: Diagnosis present

## 2021-01-27 DIAGNOSIS — I2510 Atherosclerotic heart disease of native coronary artery without angina pectoris: Secondary | ICD-10-CM | POA: Diagnosis present

## 2021-01-27 DIAGNOSIS — Z79899 Other long term (current) drug therapy: Secondary | ICD-10-CM

## 2021-01-27 DIAGNOSIS — F17210 Nicotine dependence, cigarettes, uncomplicated: Secondary | ICD-10-CM | POA: Diagnosis present

## 2021-01-27 DIAGNOSIS — E785 Hyperlipidemia, unspecified: Secondary | ICD-10-CM | POA: Diagnosis present

## 2021-01-27 DIAGNOSIS — K5900 Constipation, unspecified: Secondary | ICD-10-CM | POA: Diagnosis present

## 2021-01-27 LAB — CBG MONITORING, ED: Glucose-Capillary: 161 mg/dL — ABNORMAL HIGH (ref 70–99)

## 2021-01-27 LAB — GLUCOSE, CAPILLARY
Glucose-Capillary: 191 mg/dL — ABNORMAL HIGH (ref 70–99)
Glucose-Capillary: 145 mg/dL — ABNORMAL HIGH (ref 70–99)
Glucose-Capillary: 168 mg/dL — ABNORMAL HIGH (ref 70–99)

## 2021-01-27 LAB — BASIC METABOLIC PANEL
Anion gap: 8 (ref 5–15)
BUN: 18 mg/dL (ref 6–20)
CO2: 24 mmol/L (ref 22–32)
Calcium: 9.2 mg/dL (ref 8.9–10.3)
Chloride: 107 mmol/L (ref 98–111)
Creatinine, Ser: 0.68 mg/dL (ref 0.61–1.24)
GFR, Estimated: >60 mL/min (ref >60)
Glucose, Bld: 120 mg/dL — ABNORMAL HIGH (ref 70–99)
Potassium: 3.8 mmol/L (ref 3.5–5.1)
Sodium: 139 mmol/L (ref 135–145)

## 2021-01-27 LAB — CBC
HCT: 48.9 % (ref 39.0–52.0)
Hemoglobin: 15.1 g/dL (ref 13.0–17.0)
MCH: 27.5 pg (ref 26.0–34.0)
MCHC: 30.9 g/dL (ref 30.0–36.0)
MCV: 88.9 fL (ref 80.0–100.0)
Platelets: 263 10*3/uL (ref 150–400)
RBC: 5.5 MIL/uL (ref 4.22–5.81)
RDW: 12.8 % (ref 11.5–15.5)
WBC: 6.8 10*3/uL (ref 4.0–10.5)
nRBC: 0 % (ref 0.0–0.2)

## 2021-01-27 LAB — RESP PANEL BY RT-PCR (FLU A&B, COVID) ARPGX2
Influenza A by PCR: NEGATIVE
Influenza B by PCR: NEGATIVE
SARS Coronavirus 2 by RT PCR: NEGATIVE

## 2021-01-27 LAB — D-DIMER, QUANTITATIVE: D-Dimer, Quant: 0.27 ug/mL-FEU (ref 0.00–0.50)

## 2021-01-27 MED ORDER — SODIUM CHLORIDE 0.9 % IV SOLN
1.0000 g | INTRAVENOUS | Status: DC
  Administered 2021-01-27 – 2021-01-30 (×4): 1 g via INTRAVENOUS
  Filled 2021-01-27 (×4): qty 10

## 2021-01-27 MED ORDER — ACETAMINOPHEN 650 MG RE SUPP: 650.0000 mg | Freq: Four times a day (QID) | RECTAL | Status: DC | PRN

## 2021-01-27 MED ORDER — ACETAMINOPHEN 325 MG PO TABS: 650.0000 mg | ORAL_TABLET | Freq: Four times a day (QID) | ORAL | Status: DC | PRN

## 2021-01-27 MED ORDER — ASPIRIN EC 81 MG PO TBEC
81.0000 mg | DELAYED_RELEASE_TABLET | Freq: Every day | ORAL | Status: DC
  Administered 2021-01-27 – 2021-01-30 (×4): 81 mg via ORAL
  Filled 2021-01-27 (×4): qty 1

## 2021-01-27 MED ORDER — LEVALBUTEROL HCL 0.63 MG/3ML IN NEBU
0.6300 mg | INHALATION_SOLUTION | Freq: Four times a day (QID) | RESPIRATORY_TRACT | Status: DC | PRN
Start: 2021-01-27 — End: 2021-01-30
  Administered 2021-01-28 – 2021-01-30 (×5): 0.63 mg via RESPIRATORY_TRACT
  Filled 2021-01-27 (×5): qty 3

## 2021-01-27 MED ORDER — IPRATROPIUM BROMIDE 0.02 % IN SOLN
0.5000 mg | Freq: Once | RESPIRATORY_TRACT | Status: AC
  Administered 2021-01-27: 0.5 mg via RESPIRATORY_TRACT
  Filled 2021-01-27: qty 2.5

## 2021-01-27 MED ORDER — ATORVASTATIN CALCIUM 40 MG PO TABS
40.0000 mg | ORAL_TABLET | Freq: Every day | ORAL | Status: DC
  Administered 2021-01-27 – 2021-01-30 (×4): 40 mg via ORAL
  Filled 2021-01-27 (×4): qty 1

## 2021-01-27 MED ORDER — MAGNESIUM SULFATE 2 GM/50ML IV SOLN
2.0000 g | Freq: Once | INTRAVENOUS | Status: AC
  Administered 2021-01-27: 2 g via INTRAVENOUS
  Filled 2021-01-27: qty 50

## 2021-01-27 MED ORDER — ALBUTEROL SULFATE (2.5 MG/3ML) 0.083% IN NEBU
10.0000 mg/h | INHALATION_SOLUTION | RESPIRATORY_TRACT | Status: DC
  Administered 2021-01-27: 10 mg/h via RESPIRATORY_TRACT
  Filled 2021-01-27: qty 3

## 2021-01-27 MED ORDER — IPRATROPIUM-ALBUTEROL 0.5-2.5 (3) MG/3ML IN SOLN
3.0000 mL | Freq: Three times a day (TID) | RESPIRATORY_TRACT | Status: DC
  Administered 2021-01-28 – 2021-01-30 (×6): 3 mL via RESPIRATORY_TRACT
  Filled 2021-01-27 (×7): qty 3

## 2021-01-27 MED ORDER — INSULIN ASPART 100 UNIT/ML IJ SOLN
0.0000 [IU] | Freq: Every day | INTRAMUSCULAR | Status: DC
  Filled 2021-01-27: qty 0.05

## 2021-01-27 MED ORDER — IPRATROPIUM-ALBUTEROL 0.5-2.5 (3) MG/3ML IN SOLN
3.0000 mL | Freq: Four times a day (QID) | RESPIRATORY_TRACT | Status: DC
  Administered 2021-01-27 (×2): 3 mL via RESPIRATORY_TRACT
  Filled 2021-01-27 (×2): qty 3

## 2021-01-27 MED ORDER — ENOXAPARIN SODIUM 40 MG/0.4ML IJ SOSY
40.0000 mg | PREFILLED_SYRINGE | INTRAMUSCULAR | Status: DC
  Administered 2021-01-27 – 2021-01-30 (×4): 40 mg via SUBCUTANEOUS
  Filled 2021-01-27 (×4): qty 0.4

## 2021-01-27 MED ORDER — METHYLPREDNISOLONE SODIUM SUCC 125 MG IJ SOLR
125.0000 mg | Freq: Once | INTRAMUSCULAR | Status: AC
  Administered 2021-01-27: 125 mg via INTRAVENOUS
  Filled 2021-01-27: qty 2

## 2021-01-27 MED ORDER — METHYLPREDNISOLONE SODIUM SUCC 125 MG IJ SOLR
125.0000 mg | Freq: Two times a day (BID) | INTRAMUSCULAR | Status: AC
  Administered 2021-01-27 – 2021-01-28 (×2): 125 mg via INTRAVENOUS
  Filled 2021-01-27 (×2): qty 2

## 2021-01-27 MED ORDER — LEVALBUTEROL HCL 1.25 MG/0.5ML IN NEBU
1.2500 mg | INHALATION_SOLUTION | Freq: Once | RESPIRATORY_TRACT | Status: AC
  Administered 2021-01-27: 1.25 mg via RESPIRATORY_TRACT
  Filled 2021-01-27: qty 0.5

## 2021-01-27 MED ORDER — INSULIN ASPART 100 UNIT/ML IJ SOLN
0.0000 [IU] | Freq: Three times a day (TID) | INTRAMUSCULAR | Status: DC
  Administered 2021-01-27: 1 [IU] via SUBCUTANEOUS
  Administered 2021-01-27: 2 [IU] via SUBCUTANEOUS
  Administered 2021-01-28: 1 [IU] via SUBCUTANEOUS
  Administered 2021-01-28: 2 [IU] via SUBCUTANEOUS
  Filled 2021-01-27: qty 0.09

## 2021-01-27 MED ORDER — PREDNISONE 20 MG PO TABS
40.0000 mg | ORAL_TABLET | Freq: Every day | ORAL | Status: DC
  Administered 2021-01-28 – 2021-01-30 (×3): 40 mg via ORAL
  Filled 2021-01-27 (×3): qty 2

## 2021-01-27 MED ORDER — SENNOSIDES-DOCUSATE SODIUM 8.6-50 MG PO TABS: 1.0000 | ORAL_TABLET | Freq: Every evening | ORAL | Status: DC | PRN

## 2021-01-27 NOTE — TOC Initial Note (Signed)
Transition of Care [facility]) - Initial/Assessment Note   Patient Details  Name: Alan Mendoza MRN: [id number] Date of Birth: [birth year]  Transition of Care [facility]) CM/SW Contact:    Sherie Don, LCSW Phone Number: 01/27/2021, 2:55 PM  Clinical Narrative: [facility] consulted for PCP assistance, but patient has insurance. CSW added [place] clinic information to AVS for patient to schedule a new patient appointment.  Expected Discharge Plan: Home/Self Care Barriers to Discharge: No Barriers Identified  Patient Goals and CMS Choice Choice offered to / list presented to : NA  Expected Discharge Plan and Services Expected Discharge Plan: Home/Self Care In-house Referral: Clinical Social Work Post Acute Care Choice: NA Living arrangements for the past 2 months: Apartment           DME Arranged: N/A DME Agency: NA  Prior Living Arrangements/Services Living arrangements for the past 2 months: Apartment  Emotional Assessment Alcohol / Substance Use: Tobacco Use  Admission diagnosis:  SOB (shortness of breath) [R06.02] COPD exacerbation ([place]) [J44.1] COPD with acute exacerbation ([place]) [J44.1] Patient Active Problem List   Diagnosis Date Noted   COPD with acute exacerbation ([place]) 01/27/2021   Type 2 diabetes mellitus with other specified complication ([place]) 31/49/7026   Prediabetes 04/12/2020   Neck pain on right side 04/12/2020   Rhomboid muscle pain 04/12/2020   MDD (major depressive disorder), severe ([place]) 01/08/2020   Chronic bronchitis ([place]) 10/24/2018   Emphysema lung ([place]) 07/12/2016   Tobacco use disorder 11/24/2014   CAD (coronary artery disease) 11/03/2014   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

## 2021-01-27 NOTE — ED Triage Notes (Addendum)
Patient arrives from home with c/o SOB and cough that began 3-4 days ago. Pt states he has to cough really hard to get mucous up, states he feels like there is some sort of blockage.  Pt also reports headache and right shoulder pain.

## 2021-01-27 NOTE — H&P (Signed)
History and Physical    Jama Krichbaum KZS:[id number] DOB: 09-18-1965 DOA: 01/27/2021  PCP: Pcp, No   Patient coming from: Home   Chief Complaint: SOB, cough   HPI: Quadir Muns is a pleasant 56 y.o. male with medical history significant for COPD, CAD, tobacco abuse, deafness, and type 2 diabetes mellitus, now presenting to the emergency department for evaluation of shortness of breath and cough.  Patient reports that his chronic dyspnea and cough has been worse than usual for close to 1 month, improved for several days with a short course of prednisone, but then worsened again over the past 3 to 4 days.  He has increased cough with sputum production.  Denies fevers, chills, or chest pain.  He has had cramping pain in the lower right leg recently and had a negative venous Doppler study.  He has cut back significantly on his tobacco use, and down to only a couple cigarettes per day.  ED Course: Upon arrival to the ED, patient is found to be afebrile, saturating mid 90s on room air, mildly tachypneic and tachycardic, and with stable blood pressure.  EKG features sinus tachycardia with rate 105.  Chest x-ray is negative for acute cardiopulmonary disease.  Patient was treated with IV steroids, magnesium, albuterol, Atrovent, and leave albuterol in the ED.  Review of Systems:  All other systems reviewed and apart from HPI, are negative.  Past Medical History:  Diagnosis Date   Arthritis    Coronary artery disease    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    bilateral some slight hearing in left   Diabetes mellitus without complication ([place])    not on meds cks blood sugar weekly   Emphysema lung ([place]) 03/2014   on CT of chest   HLD (hyperlipidemia)    Myocardial infarction [facility])    2014   Pulmonary nodule 03/2014   on CT of chest   Stroke [facility])    denies   Tobacco abuse     Past Surgical History:  Procedure Laterality Date   CARDIAC CATHETERIZATION  11/14   stent placement   EXAM  UNDER ANESTHESIA WITH MANIPULATION OF KNEE Left 04/21/2014   Procedure: EXAM UNDER ANESTHESIA WITH MANIPULATION OF KNEE;  Surgeon: Meredith Pel, MD;  Location: [place] [facility];  Service: Orthopedics;  Laterality: Left;  LEFT KNEE MANIPULATION UNDER ANESTHESIA   KNEE ARTHROSCOPY Left 1984   NECK SURGERY  10/14   TOTAL KNEE ARTHROPLASTY Left 03/17/2014   Procedure: TOTAL KNEE ARTHROPLASTY;  Surgeon: Meredith Pel, MD;  Location: [place];  Service: Orthopedics;  Laterality: Left;    Social History:   reports that he has been smoking cigarettes. He has a 16.50 pack-year smoking history. He has never used smokeless tobacco. He reports that he does not currently use alcohol. He reports that he does not currently use drugs after having used the following drugs: Cocaine.  No Known Allergies  Family History  Problem Relation Age of Onset   Diabetes Mother    Osteoarthritis Mother    Alcohol abuse Brother    Kidney disease Other    Colon cancer Neg Hx    Esophageal cancer Neg Hx    Pancreatic cancer Neg Hx    Rectal cancer Neg Hx    Stomach cancer Neg Hx      Prior to Admission medications   Medication Sig Start Date End Date Taking? Authorizing Provider  albuterol (VENTOLIN HFA) 108 (90 Base) MCG/ACT inhaler Inhale 2 puffs into  the lungs every 6 (six) hours as needed for wheezing or shortness of breath. 04/12/20   Lesleigh Noe, MD  albuterol (VENTOLIN HFA) 108 (90 Base) MCG/ACT inhaler Inhale 2 puffs into the lungs every 4 (four) hours as needed for wheezing or shortness of breath. 09/27/20   Lajean Saver, MD  atorvastatin (LIPITOR) 40 MG tablet Take 1 tablet (40 mg total) by mouth daily. 04/12/20   Lesleigh Noe, MD  metFORMIN (GLUCOPHAGE) 500 MG tablet Take 1 tablet (500 mg total) by mouth 2 (two) times daily with a meal. 07/08/20   Lesleigh Noe, MD  predniSONE (DELTASONE) 20 MG tablet 2 tabs po daily x 4 days 01/15/21   Deno Etienne, DO  SYMBICORT 160-4.5 MCG/ACT inhaler INHALE 2 PUFFS INTO  THE LUNGS TWICE DAILY Patient not taking: No sig reported 07/02/20   Lesleigh Noe, MD    Physical Exam: Vitals:   01/27/21 0130 01/27/21 0300 01/27/21 0309 01/27/21 0400  BP: 136/83  (!) 146/88 108/68  Pulse: (!) 105  95 100  Resp: 20  20 19   Temp:      TempSrc:      SpO2: 95% 94% 100% 97%  Weight:      Height:        Constitutional: NAD, calm  Eyes: PERTLA, lids and conjunctivae normal ENMT: Mucous membranes are moist. Posterior pharynx clear of any exudate or lesions.   Neck: supple, no masses  Respiratory:  Diminished breath sounds bilaterally. Prolonged expiratory phase, wheezing. Increased WOB.  Cardiovascular: S1 & S2 heard, regular rate and rhythm. No extremity edema.   Abdomen: No distension, no tenderness, soft. Bowel sounds active.  Musculoskeletal: no clubbing / cyanosis. No joint deformity upper and lower extremities.   Skin: no significant rashes, lesions, ulcers. Warm, dry, well-perfused. Neurologic: Hearing deficit, CN II-XII grossly intact otherwise. Moving all extremities. Alert and oriented.  Psychiatric: Pleasant. Cooperative.    Labs and Imaging on Admission: I have personally reviewed following labs and imaging studies  CBC: Recent Labs  Lab 01/27/21 0139  WBC 6.8  HGB 15.1  HCT 48.9  MCV 88.9  PLT 034   Basic Metabolic Panel: Recent Labs  Lab 01/27/21 0139  NA 139  K 3.8  CL 107  CO2 24  GLUCOSE 120*  BUN 18  CREATININE 0.68  CALCIUM 9.2   GFR: Estimated Creatinine Clearance: 95.2 mL/min (by C-G formula based on SCr of 0.68 mg/dL). Liver Function Tests: No results for input(s): AST, ALT, ALKPHOS, BILITOT, PROT, ALBUMIN in the last 168 hours. No results for input(s): LIPASE, AMYLASE in the last 168 hours. No results for input(s): AMMONIA in the last 168 hours. Coagulation Profile: No results for input(s): INR, PROTIME in the last 168 hours. Cardiac Enzymes: No results for input(s): CKTOTAL, CKMB, CKMBINDEX, TROPONINI in the last  168 hours. BNP (last 3 results) No results for input(s): PROBNP in the last 8760 hours. HbA1C: No results for input(s): HGBA1C in the last 72 hours. CBG: No results for input(s): GLUCAP in the last 168 hours. Lipid Profile: No results for input(s): CHOL, HDL, LDLCALC, TRIG, CHOLHDL, LDLDIRECT in the last 72 hours. Thyroid Function Tests: No results for input(s): TSH, T4TOTAL, FREET4, T3FREE, THYROIDAB in the last 72 hours. Anemia Panel: No results for input(s): VITAMINB12, FOLATE, FERRITIN, TIBC, IRON, RETICCTPCT in the last 72 hours. Urine analysis:    Component Value Date/Time   COLORURINE YELLOW 03/05/2014 1009   APPEARANCEUR CLOUDY (A) 03/05/2014 1009   LABSPEC 1.033 (H)  03/05/2014 1009   PHURINE 5.0 03/05/2014 1009   GLUCOSEU NEGATIVE 03/05/2014 1009   HGBUR NEGATIVE 03/05/2014 1009   BILIRUBINUR n 11/19/2014 0902   KETONESUR 15 (A) 03/05/2014 1009   PROTEINUR n 11/19/2014 0902   PROTEINUR NEGATIVE 03/05/2014 1009   UROBILINOGEN 1.0 11/19/2014 0902   UROBILINOGEN 0.2 03/05/2014 1009   NITRITE n 11/19/2014 0902   NITRITE NEGATIVE 03/05/2014 1009   LEUKOCYTESUR Negative 11/19/2014 0902   Sepsis Labs: @LABRCNTIP (procalcitonin:4,lacticidven:4) ) Recent Results (from the past 240 hour(s))  Resp Panel by RT-PCR (Flu A&B, Covid) Nasopharyngeal Swab     Status: None   Collection Time: 01/27/21  1:25 AM   Specimen: Nasopharyngeal Swab; Nasopharyngeal(NP) swabs in vial transport medium  Result Value Ref Range Status   SARS Coronavirus 2 by RT PCR NEGATIVE NEGATIVE Final    Comment: (NOTE) SARS-CoV-2 target nucleic acids are NOT DETECTED.  The SARS-CoV-2 RNA is generally detectable in upper respiratory specimens during the acute phase of infection. The lowest concentration of SARS-CoV-2 viral copies this assay can detect is 138 copies/mL. A negative result does not preclude SARS-Cov-2 infection and should not be used as the sole basis for treatment or other patient  management decisions. A negative result may occur with  improper specimen collection/handling, submission of specimen other than nasopharyngeal swab, presence of viral mutation(s) within the areas targeted by this assay, and inadequate number of viral copies(<138 copies/mL). A negative result must be combined with clinical observations, patient history, and epidemiological information. The expected result is Negative.  Fact Sheet for Patients:  EntrepreneurPulse.com.au  Fact Sheet for Healthcare Providers:  IncredibleEmployment.be  This test is no t yet approved or cleared by the [place] FDA and  has been authorized for detection and/or diagnosis of SARS-CoV-2 by FDA under an Emergency Use Authorization (EUA). This EUA will remain  in effect (meaning this test can be used) for the duration of the COVID-19 declaration under Section 564(b)(1) of the Act, 21 U.S.C.section 360bbb-3(b)(1), unless the authorization is terminated  or revoked sooner.       Influenza A by PCR NEGATIVE NEGATIVE Final   Influenza B by PCR NEGATIVE NEGATIVE Final    Comment: (NOTE) The Xpert Xpress SARS-CoV-2/FLU/RSV plus assay is intended as an aid in the diagnosis of influenza from Nasopharyngeal swab specimens and should not be used as a sole basis for treatment. Nasal washings and aspirates are unacceptable for Xpert Xpress SARS-CoV-2/FLU/RSV testing.  Fact Sheet for Patients: EntrepreneurPulse.com.au  Fact Sheet for Healthcare Providers: IncredibleEmployment.be  This test is not yet approved or cleared by the [place] FDA and has been authorized for detection and/or diagnosis of SARS-CoV-2 by FDA under an Emergency Use Authorization (EUA). This EUA will remain in effect (meaning this test can be used) for the duration of the COVID-19 declaration under Section 564(b)(1) of the Act, 21 U.S.C. section 360bbb-3(b)(1),  unless the authorization is terminated or revoked.  Performed at [facility], [place] [address], [place], [place] [address]      Radiological Exams on Admission: DG Chest Port 1 View  Result Date: 01/27/2021 CLINICAL DATA:  Shortness of breath and cough. EXAM: PORTABLE CHEST 1 VIEW COMPARISON:  Chest radiograph dated 01/14/2021 FINDINGS: No focal consolidation, pleural effusion or pneumothorax. The cardiac silhouette is within normal limits. No acute osseous pathology. IMPRESSION: No active disease. Electronically Signed   By: Anner Crete M.D.   On: 01/27/2021 01:46    EKG: Independently reviewed. Sinus tachycardia, rate 105, artifact.  Assessment/Plan   1. COPD exacerbation  - Presents with 3-4 days of increased cough and SOB, improved with steroids and SABA/SAMA in ED but continues to be dyspneic at rest  - Culture sputum, continue systemic steroid, start antibiotic, continue SABA/SAMA scheduled with additional prn SABA   2. Type II DM  - A1c was 6.8% in June 2022  - Check CBGs and use SSI for now    3. CAD - Hx of stent in 2014  - No anginal complaint, continue atorvastatin and aspirin     DVT prophylaxis: Lovenox  Code Status: Full  Level of Care: Level of care: Med-Surg Family Communication: None present  Disposition Plan:  Patient is from: home  Anticipated d/c is to: Home  Anticipated d/c date is: 1/27 or 01/29/21  Patient currently: Pending improvement in respiratory status  Consults called: none  Admission status: Observation     Vianne Bulls, MD [facility]  01/27/2021, 4:36 AM

## 2021-01-27 NOTE — ED Provider Notes (Signed)
[facility] DEPT Provider Note  CSN: [id number] Arrival date & time: 01/27/21 0114  Chief Complaint(s) Shortness of Breath and Cough  HPI Alan Mendoza is a 56 y.o. male with a past medical history listed below including COPD not requiring oxygen at home here for 3 to 4 days of gradually worsening shortness of breath.  Patient has tried home rescue inhaler without much relief.  He denies any fevers.  Endorses chronic cough and states that it is hard for him to cough up any mucus.  He is endorsing muscular pain from all the coughing.  No nausea or vomiting.  No other physical complaints.  The history is provided by the patient.   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    bilateral some slight hearing in left   Diabetes mellitus without complication ([place])    not on meds cks blood sugar weekly   Emphysema lung ([place]) 03/2014   on CT of chest   HLD (hyperlipidemia)    Myocardial infarction [facility])    2014   Pulmonary nodule 03/2014   on CT of chest   Stroke [facility])    denies   Tobacco abuse    Patient Active Problem List   Diagnosis Date Noted   Type 2 diabetes mellitus with other specified complication ([place]) 66/29/4765   Prediabetes 04/12/2020   Neck pain on right side 04/12/2020   Rhomboid muscle pain 04/12/2020   MDD (major depressive disorder), severe ([place]) 01/08/2020   Chronic bronchitis ([place]) 10/24/2018   Emphysema lung ([place]) 07/12/2016   Tobacco use disorder 11/24/2014   CAD (coronary artery disease) 11/03/2014   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 Authorizing Provider  albuterol (VENTOLIN HFA) 108 (90 Base) MCG/ACT inhaler Inhale 2 puffs into the lungs every 6 (six)  hours as needed for wheezing or shortness of breath. 04/12/20   Lesleigh Noe, MD  albuterol (VENTOLIN HFA) 108 (90 Base) MCG/ACT inhaler Inhale 2 puffs into the lungs every 4 (four) hours as needed for wheezing or shortness of breath. 09/27/20   Lajean Saver, MD  atorvastatin (LIPITOR) 40 MG tablet Take 1 tablet (40 mg total) by mouth daily. 04/12/20   Lesleigh Noe, MD  metFORMIN (GLUCOPHAGE) 500 MG tablet Take 1 tablet (500 mg total) by mouth 2 (two) times daily with a meal. 07/08/20   Lesleigh Noe, MD  predniSONE (DELTASONE) 20 MG tablet 2 tabs po daily x 4 days 01/15/21   Deno Etienne, DO  SYMBICORT 160-4.5 MCG/ACT inhaler INHALE 2 PUFFS INTO THE LUNGS TWICE DAILY Patient not taking: No sig reported 07/02/20   Lesleigh Noe, MD  Allergies Patient has no known allergies.  Review of Systems Review of Systems As noted in HPI  Physical Exam Vital Signs  I have reviewed the triage vital signs BP (!) 146/88    Pulse 95    Temp 98 F (36.7 C) (Oral)    Resp 20    Ht 5\' 7"  (1.702 m)    Wt 74.4 kg    SpO2 100%    BMI 25.69 kg/m   Physical Exam Vitals reviewed.  Constitutional:      General: He is not in acute distress.    Appearance: He is well-developed. He is not diaphoretic.  HENT:     Head: Normocephalic and atraumatic.     Nose: Nose normal.  Eyes:     General: No scleral icterus.       Right eye: No discharge.        Left eye: No discharge.     Conjunctiva/sclera: Conjunctivae normal.     Pupils: Pupils are equal, round, and reactive to light.  Cardiovascular:     Rate and Rhythm: Normal rate and regular rhythm.     Heart sounds: No murmur heard.   No friction rub. No gallop.  Pulmonary:     Effort: Pulmonary effort is normal. Tachypnea present. No respiratory distress.     Breath sounds: Decreased air movement present. No stridor. Wheezing  (faint) present. No rales.  Abdominal:     General: There is no distension.     Palpations: Abdomen is soft.     Tenderness: There is no abdominal tenderness.  Musculoskeletal:        General: No tenderness.     Cervical back: Normal range of motion and neck supple.     Right lower leg: No edema.     Left lower leg: No edema.  Skin:    General: Skin is warm and dry.     Findings: No erythema or rash.  Neurological:     Mental Status: He is alert and oriented to person, place, and time.    ED Results and Treatments Labs (all labs ordered are listed, but only abnormal results are displayed) Labs Reviewed  BASIC METABOLIC PANEL - Abnormal; Notable for the following components:      Result Value   Glucose, Bld 120 (*)    All other components within normal limits  RESP PANEL BY RT-PCR (FLU A&B, COVID) ARPGX2  CBC  D-DIMER, QUANTITATIVE                                                                                                                         EKG  EKG Interpretation  Date/Time:    Ventricular Rate:    PR Interval:    QRS Duration:   QT Interval:    QTC Calculation:   R Axis:     Text Interpretation:         Radiology DG Chest Port 1 View  Result Date: 01/27/2021 CLINICAL DATA:  Shortness  of breath and cough. EXAM: PORTABLE CHEST 1 VIEW COMPARISON:  Chest radiograph dated 01/14/2021 FINDINGS: No focal consolidation, pleural effusion or pneumothorax. The cardiac silhouette is within normal limits. No acute osseous pathology. IMPRESSION: No active disease. Electronically Signed   By: Anner Crete M.D.   On: 01/27/2021 01:46    Pertinent labs & imaging results that were available during my care of the patient were reviewed by me and considered in my medical decision making (see MDM for details).  Medications Ordered in ED Medications  albuterol (PROVENTIL) (2.5 MG/3ML) 0.083% nebulizer solution (0 mg/hr Nebulization Stopped 01/27/21 0350)  magnesium sulfate  IVPB 2 g 50 mL (has no administration in time range)  ipratropium (ATROVENT) nebulizer solution 0.5 mg (0.5 mg Nebulization Given 01/27/21 0300)  methylPREDNISolone sodium succinate (SOLU-MEDROL) 125 mg/2 mL injection 125 mg (125 mg Intravenous Given 01/27/21 0304)  levalbuterol (XOPENEX) nebulizer solution 1.25 mg (1.25 mg Nebulization Given 01/27/21 0412)                                                                                                                                     Procedures .1-3 Lead EKG Interpretation Performed by: Fatima Blank, MD Authorized by: Fatima Blank, MD     Interpretation: normal     ECG rate:  98   ECG rate assessment: normal     Rhythm: sinus rhythm     Ectopy: none     Conduction: normal   .Critical Care Performed by: Fatima Blank, MD Authorized by: Fatima Blank, MD   Critical care provider statement:    Critical care time (minutes):  45   Critical care time was exclusive of:  Separately billable procedures and treating other patients   Critical care was necessary to treat or prevent imminent or life-threatening deterioration of the following conditions:  Respiratory failure   Critical care was time spent personally by me on the following activities:  Development of treatment plan with patient or surrogate, discussions with consultants, evaluation of patient's response to treatment, examination of patient, obtaining history from patient or surrogate, review of old charts, re-evaluation of patient's condition, pulse oximetry, ordering and review of radiographic studies, ordering and review of laboratory studies and ordering and performing treatments and interventions   Care discussed with: admitting provider    (including critical care time)  Medical Decision Making / ED Course        Shortness of breath Clinically is most consistent with COPD exacerbation, acute on chronic Low suspicion for pulmonary  embolism or heart failure. Will get labs and x-ray to rule out pneumonia.  Work-up ordered to assess concerns above.  Labs and imaging independently interpreted by me and noted below: CBC without leukocytosis or anemia No significant electrolyte derangement or renal sufficiency COVID/influenza negative Chest x-ray without evidence of pneumonia or pneumothorax.  Management: Solu-Medrol Continuous neb.  Reassessment: Patient is having better air movement  and now has more prominent wheezing but still has increased work of breathing. Patient requires additional nebulizers Will order magnesium. He will require admission for continued management    Final Clinical Impression(s) / ED Diagnoses Final diagnoses:  COPD exacerbation ([place])           This chart was dictated using voice recognition software.  Despite best efforts to proofread,  errors can occur which can change the documentation meaning.    Fatima Blank, MD 01/27/21 [phone]

## 2021-01-27 NOTE — Assessment & Plan Note (Addendum)
Glucoses controlled so far on steroids -Continue sliding scale corrections -Continue aspirin, statin

## 2021-01-27 NOTE — Assessment & Plan Note (Signed)
-  Continue Rocephin -Continue steroids -Continue bronchodilators

## 2021-01-27 NOTE — Plan of Care (Signed)
°  Problem: Education: Goal: Knowledge of disease or condition will improve Outcome: Progressing   Problem: Activity: Goal: Ability to tolerate increased activity will improve Outcome: Progressing   Problem: Respiratory: Goal: Ability to maintain a clear airway will improve Outcome: [place], RN 01/27/21 8:50 PM

## 2021-01-27 NOTE — Progress Notes (Signed)
°  Progress Note   Patient: Alan Mendoza ZOX:[id number] DOB: 01-29-1965 DOA: 01/27/2021     0 DOS: the patient was seen and examined on 01/27/2021   Brief hospital course: Alan Mendoza is a 56 y.o. M with deafness, COPD, CAD, tobacco abuse, and DM p/w 1 month increased respiratory symptoms then few days severe shortness of breath and cough.     In the ER, afebrile, saturating mid 90s on room air, tachypneic.  CXR clear.  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 Myna Hidalgo from earlier today.   Author: Edwin Dada, MD 01/27/2021 4:08 PM  For on call review [email or web address].

## 2021-01-27 NOTE — ED Notes (Signed)
ED TO INPATIENT HANDOFF REPORT  Name/Age/Gender Alan Mendoza 56 y.o. male  Code Status    Code Status Orders  (From admission, onward)           Start     Ordered   01/27/21 0436  Full code  Continuous        01/27/21 0436           Code Status History     Date Active Date Inactive Code Status Order ID Comments User Context   03/17/2014 1947 03/20/2014 1822 Full Code [id number]  Meredith Pel, MD Inpatient      Advance Directive Documentation    Flowsheet Row Most Recent Value  Type of Advance Directive Healthcare Power of Attorney, Living will  Pre-existing out of facility DNR order (yellow form or pink MOST form) --  "MOST" Form in Place? --       Home/SNF/Other Home  Chief Complaint COPD with acute exacerbation ([place]) [J44.1]  Level of Care/Admitting Diagnosis ED Disposition     ED Disposition  Admit   Condition  --   [place]: [place] [[id number]]  Level of Care: Med-Surg [16]  May place patient in observation at [facility] or [place] if equivalent level of care is available:: Yes  Covid Evaluation: Confirmed COVID Negative  Diagnosis: COPD with acute exacerbation [facility]) [[id number]]  Admitting Physician: Vianne Bulls [[id number]]  Attending Physician: Vianne Bulls [[id number]]          Medical History Past Medical History:  Diagnosis Date   Arthritis    Coronary artery disease    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    bilateral some slight hearing in left   Diabetes mellitus without complication ([place])    not on meds cks blood sugar weekly   Emphysema lung ([place]) 03/2014   on CT of chest   HLD (hyperlipidemia)    Myocardial infarction [facility])    2014   Pulmonary nodule 03/2014   on CT of chest   Stroke [facility])    denies   Tobacco abuse     Allergies No Known Allergies  IV Location/Drains/Wounds Patient Lines/Drains/Airways Status     Active Line/Drains/Airways     Name Placement date  Placement time Site Days   Peripheral IV 01/27/21 20 G Right Antecubital 01/27/21  0300  Antecubital  less than 1   Incision (Closed) 03/17/14 Knee Left 03/17/14  1527  -- 2508   Incision (Closed) 04/21/14 Knee Left 04/21/14  1644  -- 2473            Labs/Imaging Results for orders placed or performed during the hospital encounter of 01/27/21 (from the past 48 hour(s))  Resp Panel by RT-PCR (Flu A&B, Covid) Nasopharyngeal Swab     Status: None   Collection Time: 01/27/21  1:25 AM   Specimen: Nasopharyngeal Swab; Nasopharyngeal(NP) swabs in vial transport medium  Result Value Ref Range   SARS Coronavirus 2 by RT PCR NEGATIVE NEGATIVE    Comment: (NOTE) SARS-CoV-2 target nucleic acids are NOT DETECTED.  The SARS-CoV-2 RNA is generally detectable in upper respiratory specimens during the acute phase of infection. The lowest concentration of SARS-CoV-2 viral copies this assay can detect is 138 copies/mL. A negative result does not preclude SARS-Cov-2 infection and should not be used as the sole basis for treatment or other patient management decisions. A negative result may occur with  improper specimen collection/handling, submission of specimen other than nasopharyngeal  swab, presence of viral mutation(s) within the areas targeted by this assay, and inadequate number of viral copies(<138 copies/mL). A negative result must be combined with clinical observations, patient history, and epidemiological information. The expected result is Negative.  Fact Sheet for Patients:  EntrepreneurPulse.com.au  Fact Sheet for Healthcare Providers:  IncredibleEmployment.be  This test is no t yet approved or cleared by the [place] FDA and  has been authorized for detection and/or diagnosis of SARS-CoV-2 by FDA under an Emergency Use Authorization (EUA). This EUA will remain  in effect (meaning this test can be used) for the duration of the COVID-19  declaration under Section 564(b)(1) of the Act, 21 U.S.C.section 360bbb-3(b)(1), unless the authorization is terminated  or revoked sooner.       Influenza A by PCR NEGATIVE NEGATIVE   Influenza B by PCR NEGATIVE NEGATIVE    Comment: (NOTE) The Xpert Xpress SARS-CoV-2/FLU/RSV plus assay is intended as an aid in the diagnosis of influenza from Nasopharyngeal swab specimens and should not be used as a sole basis for treatment. Nasal washings and aspirates are unacceptable for Xpert Xpress SARS-CoV-2/FLU/RSV testing.  Fact Sheet for Patients: EntrepreneurPulse.com.au  Fact Sheet for Healthcare Providers: IncredibleEmployment.be  This test is not yet approved or cleared by the [place] FDA and has been authorized for detection and/or diagnosis of SARS-CoV-2 by FDA under an Emergency Use Authorization (EUA). This EUA will remain in effect (meaning this test can be used) for the duration of the COVID-19 declaration under Section 564(b)(1) of the Act, 21 U.S.C. section 360bbb-3(b)(1), unless the authorization is terminated or revoked.  Performed at [facility], [place] [address], [place], [place] [address]   CBC     Status: None   Collection Time: 01/27/21  1:39 AM  Result Value Ref Range   WBC 6.8 4.0 - 10.5 K/uL   RBC 5.50 4.22 - 5.81 MIL/uL   Hemoglobin 15.1 13.0 - 17.0 g/dL   HCT 48.9 39.0 - 52.0 %   MCV 88.9 80.0 - 100.0 fL   MCH 27.5 26.0 - 34.0 pg   MCHC 30.9 30.0 - 36.0 g/dL   RDW 12.8 11.5 - 15.5 %   Platelets 263 150 - 400 K/uL   nRBC 0.0 0.0 - 0.2 %    Comment: Performed at [facility], [place] [address], [place], [place] [address]  Basic metabolic panel     Status: Abnormal   Collection Time: 01/27/21  1:39 AM  Result Value Ref Range   Sodium 139 135 - 145 mmol/L   Potassium 3.8 3.5 - 5.1 mmol/L   Chloride 107 98 - 111 mmol/L   CO2 24 22 - 32 mmol/L   Glucose, Bld 120 (H) 70 - 99 mg/dL     Comment: Glucose reference range applies only to samples taken after fasting for at least 8 hours.   BUN 18 6 - 20 mg/dL   Creatinine, Ser 0.68 0.61 - 1.24 mg/dL   Calcium 9.2 8.9 - 10.3 mg/dL   GFR, Estimated >60 >60 mL/min    Comment: (NOTE) Calculated using the CKD-EPI Creatinine Equation (2021)    Anion gap 8 5 - 15    Comment: Performed at [facility], [place] [address], [place], [place] [address]  D-dimer, quantitative     Status: None   Collection Time: 01/27/21  1:39 AM  Result Value Ref Range   D-Dimer, Quant 0.27 0.00 - 0.50 ug/mL-FEU    Comment: (NOTE) At the  manufacturer cut-off value of 0.5 g/mL FEU, this assay has a negative predictive value of 95-100%.This assay is intended for use in conjunction with a clinical pretest probability (PTP) assessment model to exclude pulmonary embolism (PE) and deep venous thrombosis (DVT) in outpatients suspected of PE or DVT. Results should be correlated with clinical presentation. Performed at [facility], [place] [address] [place], [place] [address]   CBG monitoring, ED     Status: Abnormal   Collection Time: 01/27/21  7:40 AM  Result Value Ref Range   Glucose-Capillary 161 (H) 70 - 99 mg/dL    Comment: Glucose reference range applies only to samples taken after fasting for at least 8 hours.   Comment 1 Notify RN    Comment 2 Document in Chart    DG Chest Port 1 View  Result Date: 01/27/2021 CLINICAL DATA:  Shortness of breath and cough. EXAM: PORTABLE CHEST 1 VIEW COMPARISON:  Chest radiograph dated 01/14/2021 FINDINGS: No focal consolidation, pleural effusion or pneumothorax. The cardiac silhouette is within normal limits. No acute osseous pathology. IMPRESSION: No active disease. Electronically Signed   By: Anner Crete M.D.   On: 01/27/2021 01:46    Pending Labs Unresulted Labs (From admission, onward)     Start     Ordered   02/03/21 0500  Creatinine, serum  (enoxaparin  (LOVENOX)    CrCl >/= 30 ml/min)  Weekly,   R     Comments: while on enoxaparin therapy    01/27/21 0436   01/28/21 0500  HIV Antibody (routine testing w rflx)  (HIV Antibody (Routine testing w reflex) panel)  Tomorrow morning,   R        01/27/21 0436   01/28/21 5361  Basic metabolic panel  Tomorrow morning,   R        01/27/21 0436   01/28/21 0500  CBC  Tomorrow morning,   R        01/27/21 0436   01/27/21 0434  Expectorated Sputum Assessment w Gram Stain, Rflx to Resp Cult  (COPD / Pneumonia / Cellulitis / Lower Extremity Wound)  Once,   R        01/27/21 0436            Vitals/Pain Today's Vitals   01/27/21 0300 01/27/21 0309 01/27/21 0400 01/27/21 0733  BP:  (!) 146/88 108/68 115/64  Pulse:  95 100 92  Resp:  20 19 18   Temp:      TempSrc:      SpO2: 94% 100% 97% 95%  Weight:      Height:      PainSc:        Isolation Precautions No active isolations  Medications Medications  insulin aspart (novoLOG) injection 0-9 Units (has no administration in time range)  insulin aspart (novoLOG) injection 0-5 Units (has no administration in time range)  cefTRIAXone (ROCEPHIN) 1 g in sodium chloride 0.9 % 100 mL IVPB (0 g Intravenous Stopped 01/27/21 0553)  methylPREDNISolone sodium succinate (SOLU-MEDROL) 125 mg/2 mL injection 125 mg (has no administration in time range)    Followed by  predniSONE (DELTASONE) tablet 40 mg (has no administration in time range)  levalbuterol (XOPENEX) nebulizer solution 0.63 mg (has no administration in time range)  ipratropium-albuterol (DUONEB) 0.5-2.5 (3) MG/3ML nebulizer solution 3 mL (has no administration in time range)  enoxaparin (LOVENOX) injection 40 mg (has no administration in time range)  acetaminophen (TYLENOL) tablet 650 mg (has no administration in time range)  Or  acetaminophen (TYLENOL) suppository 650 mg (has no administration in time range)  senna-docusate (Senokot-S) tablet 1 tablet (has no administration in time range)   atorvastatin (LIPITOR) tablet 40 mg (has no administration in time range)  aspirin EC tablet 81 mg (has no administration in time range)  ipratropium (ATROVENT) nebulizer solution 0.5 mg (0.5 mg Nebulization Given 01/27/21 0300)  methylPREDNISolone sodium succinate (SOLU-MEDROL) 125 mg/2 mL injection 125 mg (125 mg Intravenous Given 01/27/21 0304)  magnesium sulfate IVPB 2 g 50 mL (0 g Intravenous Stopped 01/27/21 0516)  levalbuterol (XOPENEX) nebulizer solution 1.25 mg (1.25 mg Nebulization Given 01/27/21 0412)    Mobility walks with person assist

## 2021-01-27 NOTE — Hospital Course (Signed)
Alan Mendoza is a 56 y.o. M with deafness, COPD, CAD, tobacco abuse, and DM p/w 1 month increased respiratory symptoms then few days severe shortness of breath and cough.     In the ER, afebrile, saturating mid 90s on room air, tachypneic.  CXR clear.  Given IV steroids, magnesium, albuterol, Atrovent

## 2021-01-28 DIAGNOSIS — M1990 Unspecified osteoarthritis, unspecified site: Secondary | ICD-10-CM | POA: Diagnosis present

## 2021-01-28 DIAGNOSIS — R000 Tachycardia, unspecified: Secondary | ICD-10-CM | POA: Diagnosis present

## 2021-01-28 DIAGNOSIS — G4700 Insomnia, unspecified: Secondary | ICD-10-CM | POA: Diagnosis present

## 2021-01-28 DIAGNOSIS — Z7951 Long term (current) use of inhaled steroids: Secondary | ICD-10-CM | POA: Diagnosis not present

## 2021-01-28 DIAGNOSIS — E1169 Type 2 diabetes mellitus with other specified complication: Secondary | ICD-10-CM | POA: Diagnosis present

## 2021-01-28 DIAGNOSIS — Z955 Presence of coronary angioplasty implant and graft: Secondary | ICD-10-CM | POA: Diagnosis not present

## 2021-01-28 DIAGNOSIS — Z833 Family history of diabetes mellitus: Secondary | ICD-10-CM | POA: Diagnosis not present

## 2021-01-28 DIAGNOSIS — Z811 Family history of alcohol abuse and dependence: Secondary | ICD-10-CM | POA: Diagnosis not present

## 2021-01-28 DIAGNOSIS — K5900 Constipation, unspecified: Secondary | ICD-10-CM | POA: Diagnosis present

## 2021-01-28 DIAGNOSIS — Z79899 Other long term (current) drug therapy: Secondary | ICD-10-CM | POA: Diagnosis not present

## 2021-01-28 DIAGNOSIS — B0689 Other rubella complications: Secondary | ICD-10-CM | POA: Diagnosis not present

## 2021-01-28 DIAGNOSIS — I252 Old myocardial infarction: Secondary | ICD-10-CM | POA: Diagnosis not present

## 2021-01-28 DIAGNOSIS — J441 Chronic obstructive pulmonary disease with (acute) exacerbation: Secondary | ICD-10-CM | POA: Diagnosis present

## 2021-01-28 DIAGNOSIS — Z7984 Long term (current) use of oral hypoglycemic drugs: Secondary | ICD-10-CM | POA: Diagnosis not present

## 2021-01-28 DIAGNOSIS — I2510 Atherosclerotic heart disease of native coronary artery without angina pectoris: Secondary | ICD-10-CM | POA: Diagnosis present

## 2021-01-28 DIAGNOSIS — E785 Hyperlipidemia, unspecified: Secondary | ICD-10-CM | POA: Diagnosis present

## 2021-01-28 DIAGNOSIS — Z20822 Contact with and (suspected) exposure to covid-19: Secondary | ICD-10-CM | POA: Diagnosis present

## 2021-01-28 DIAGNOSIS — J449 Chronic obstructive pulmonary disease, unspecified: Secondary | ICD-10-CM | POA: Diagnosis present

## 2021-01-28 DIAGNOSIS — F17210 Nicotine dependence, cigarettes, uncomplicated: Secondary | ICD-10-CM | POA: Diagnosis present

## 2021-01-28 DIAGNOSIS — H9193 Unspecified hearing loss, bilateral: Secondary | ICD-10-CM | POA: Diagnosis present

## 2021-01-28 LAB — BASIC METABOLIC PANEL
Anion gap: 9 (ref 5–15)
BUN: 26 mg/dL — ABNORMAL HIGH (ref 6–20)
CO2: 24 mmol/L (ref 22–32)
Calcium: 9.3 mg/dL (ref 8.9–10.3)
Chloride: 106 mmol/L (ref 98–111)
Creatinine, Ser: 0.75 mg/dL (ref 0.61–1.24)
GFR, Estimated: >60 mL/min (ref >60)
Glucose, Bld: 162 mg/dL — ABNORMAL HIGH (ref 70–99)
Potassium: 4.2 mmol/L (ref 3.5–5.1)
Sodium: 139 mmol/L (ref 135–145)

## 2021-01-28 LAB — CBC
HCT: 44.2 % (ref 39.0–52.0)
Hemoglobin: 13.8 g/dL (ref 13.0–17.0)
MCH: 27.8 pg (ref 26.0–34.0)
MCHC: 31.2 g/dL (ref 30.0–36.0)
MCV: 88.9 fL (ref 80.0–100.0)
Platelets: 290 10*3/uL (ref 150–400)
RBC: 4.97 MIL/uL (ref 4.22–5.81)
RDW: 12.8 % (ref 11.5–15.5)
WBC: 10.6 10*3/uL — ABNORMAL HIGH (ref 4.0–10.5)
nRBC: 0 % (ref 0.0–0.2)

## 2021-01-28 LAB — GLUCOSE, CAPILLARY
Glucose-Capillary: 160 mg/dL — ABNORMAL HIGH (ref 70–99)
Glucose-Capillary: 170 mg/dL — ABNORMAL HIGH (ref 70–99)
Glucose-Capillary: 144 mg/dL — ABNORMAL HIGH (ref 70–99)
Glucose-Capillary: 145 mg/dL — ABNORMAL HIGH (ref 70–99)

## 2021-01-28 LAB — STREP PNEUMONIAE URINARY ANTIGEN: Strep Pneumo Urinary Antigen: NEGATIVE

## 2021-01-28 LAB — HIV ANTIBODY (ROUTINE TESTING W REFLEX): HIV Screen 4th Generation wRfx: NONREACTIVE

## 2021-01-28 MED ORDER — GUAIFENESIN ER 600 MG PO TB12
600.0000 mg | ORAL_TABLET | Freq: Two times a day (BID) | ORAL | Status: DC
  Administered 2021-01-28 – 2021-01-30 (×5): 600 mg via ORAL
  Filled 2021-01-28 (×5): qty 1

## 2021-01-28 MED ORDER — MELATONIN 3 MG PO TABS
3.0000 mg | ORAL_TABLET | Freq: Every day | ORAL | Status: DC
  Administered 2021-01-28 – 2021-01-29 (×2): 3 mg via ORAL
  Filled 2021-01-28 (×2): qty 1

## 2021-01-28 MED ORDER — AZITHROMYCIN 250 MG PO TABS
500.0000 mg | ORAL_TABLET | Freq: Every day | ORAL | Status: DC
  Administered 2021-01-28 – 2021-01-30 (×3): 500 mg via ORAL
  Filled 2021-01-28 (×3): qty 2

## 2021-01-28 NOTE — Progress Notes (Incomplete)
SATURATION QUALIFICATIONS: (This note is used to comply with regulatory documentation for home oxygen)  Patient Saturations on Room Air at Rest = 97%  Patient Saturations on Room Air while Ambulating = 94%  Patient Saturations on *** Liters of oxygen while Ambulating = ***%  Please briefly explain why patient needs home oxygen:

## 2021-01-28 NOTE — Progress Notes (Signed)
PROGRESS NOTE    Alan Mendoza  LKG:[id number] DOB: 04-14-1965 DOA: 01/27/2021 PCP: Pcp, No    Chief Complaint  Patient presents with   Shortness of Breath   Cough    Brief Narrative:  Mr. Alan Mendoza is a 56 y.o. M with deafness, COPD, CAD, tobacco abuse, and DM p/w 1 month increased respiratory symptoms then few days severe shortness of breath and cough.    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   CAD (coronary artery disease)   Type 2 diabetes mellitus with other specified complication (HCC)   COPD (chronic obstructive pulmonary disease) ([place])  COPD exacerbation -Report has been sick since December with flulike symptoms -Was seen in the ED recently did not improve  on short steroid taper -Continue cough, lung exam diminished with intermittent mild wheezing -Cxr no acute findings -Check urine strep -Continue steroid, nebs , continue Rocephin, add on Zithromax, add Mucinex -Home O2 eval -Smoking cessation education provided  Noninsulin-dependent type 2 diabetes Home medication metformin held On SSI while on steroid Continue statin  CAD s/p stent in [place] Denies chest pain, continue aspirin statin  Insomnia Report not able to sleep well at home either Start melatonin May benefit from outpatient sleep study, need to follow-up with PCP  Deafness both years due to rubella Use sign language  Can communicate by writing   Body mass index is 25.69 kg/m.[place] Kitchen      Unresulted Labs (From admission, onward)     Start     Ordered   02/03/21 0500  Creatinine, serum  (enoxaparin (LOVENOX)    CrCl >/= 30 ml/min)  Weekly,   R     Comments: while on enoxaparin therapy    01/27/21  0436   01/29/21 6644  Basic metabolic panel  Tomorrow morning,   R        01/28/21 1839   01/29/21 0500  Magnesium  Tomorrow morning,   R        01/28/21 1839   01/27/21 0434  Expectorated Sputum Assessment w Gram Stain, Rflx to Resp Cult  (COPD / Pneumonia / Cellulitis / Lower Extremity Wound)  Once,   R        01/27/21 0436              DVT prophylaxis: enoxaparin (LOVENOX) injection 40 mg Start: 01/27/21 1000   Code Status: Full Family Communication: Patient Disposition:   Status is: Inpatient  Dispo: The patient is from: Home              Anticipated d/c is to: Home              Anticipated d/c date is: Likely on 1/28                Consultants:  None  Procedures:  None  Antimicrobials:   Anti-infectives (From admission, onward)    Start     Dose/Rate Route Frequency Ordered Stop   01/28/21 1300  azithromycin (ZITHROMAX) tablet 500 mg        500 mg Oral Daily 01/28/21 1209     01/27/21 0445  cefTRIAXone (ROCEPHIN) 1 g in sodium chloride 0.9 %  100 mL IVPB        1 g 200 mL/hr over 30 Minutes Intravenous Every 24 hours 01/27/21 0436 02/01/21 0444           Objective: Vitals:   01/28/21 0822 01/28/21 1311 01/28/21 1416 01/28/21 1813  BP:   131/77   Pulse:   88   Resp:      Temp:   98 F (36.7 C)   TempSrc:      SpO2: 97% 97% 94% 97%  Weight:      Height:        Intake/Output Summary (Last 24 hours) at 01/28/2021 1852 Last data filed at 01/28/2021 1800 Gross per 24 hour  Intake 240 ml  Output --  Net 240 ml   Filed Weights   01/27/21 0126  Weight: 74.4 kg    Examination:  General exam: alert, awake, communicative,calm, NAD, can write to communicate Respiratory system: diminished with intermittent faint wheezing. Respiratory effort normal. Cardiovascular system:  RRR.  Gastrointestinal system: Abdomen is nondistended, soft and nontender.  Normal bowel sounds heard. Central nervous system: Alert and oriented. deafness Extremities:  no  edema Skin: No rashes, lesions or ulcers Psychiatry: Judgement and insight appear normal. Mood & affect appropriate.     Data Reviewed: I have personally reviewed following labs and imaging studies  CBC: Recent Labs  Lab 01/27/21 0139 01/28/21 0317  WBC 6.8 10.6*  HGB 15.1 13.8  HCT 48.9 44.2  MCV 88.9 88.9  PLT 263 161    Basic Metabolic Panel: Recent Labs  Lab 01/27/21 0139 01/28/21 0317  NA 139 139  K 3.8 4.2  CL 107 106  CO2 24 24  GLUCOSE 120* 162*  BUN 18 26*  CREATININE 0.68 0.75  CALCIUM 9.2 9.3    GFR: Estimated Creatinine Clearance: 95.2 mL/min (by C-G formula based on SCr of 0.75 mg/dL).  Liver Function Tests: No results for input(s): AST, ALT, ALKPHOS, BILITOT, PROT, ALBUMIN in the last 168 hours.  CBG: Recent Labs  Lab 01/27/21 1520 01/27/21 2111 01/28/21 0745 01/28/21 1153 01/28/21 1723  GLUCAP 145* 168* 160* 170* 144*     Recent Results (from the past 240 hour(s))  Resp Panel by RT-PCR (Flu A&B, Covid) Nasopharyngeal Swab     Status: None   Collection Time: 01/27/21  1:25 AM   Specimen: Nasopharyngeal Swab; Nasopharyngeal(NP) swabs in vial transport medium  Result Value Ref Range Status   SARS Coronavirus 2 by RT PCR NEGATIVE NEGATIVE Final    Comment: (NOTE) SARS-CoV-2 target nucleic acids are NOT DETECTED.  The SARS-CoV-2 RNA is generally detectable in upper respiratory specimens during the acute phase of infection. The lowest concentration of SARS-CoV-2 viral copies this assay can detect is 138 copies/mL. A negative result does not preclude SARS-Cov-2 infection and should not be used as the sole basis for treatment or other patient management decisions. A negative result may occur with  improper specimen collection/handling, submission of specimen other than nasopharyngeal swab, presence of viral mutation(s) within the areas targeted by this assay, and inadequate number of viral copies(<138 copies/mL). A negative result must be  combined with clinical observations, patient history, and epidemiological information. The expected result is Negative.  Fact Sheet for Patients:  EntrepreneurPulse.com.au  Fact Sheet for Healthcare Providers:  IncredibleEmployment.be  This test is no t yet approved or cleared by the [place] FDA and  has been authorized for detection and/or diagnosis of SARS-CoV-2 by FDA under an Emergency Use Authorization (EUA). This EUA  will remain  in effect (meaning this test can be used) for the duration of the COVID-19 declaration under Section 564(b)(1) of the Act, 21 U.S.C.section 360bbb-3(b)(1), unless the authorization is terminated  or revoked sooner.       Influenza A by PCR NEGATIVE NEGATIVE Final   Influenza B by PCR NEGATIVE NEGATIVE Final    Comment: (NOTE) The Xpert Xpress SARS-CoV-2/FLU/RSV plus assay is intended as an aid in the diagnosis of influenza from Nasopharyngeal swab specimens and should not be used as a sole basis for treatment. Nasal washings and aspirates are unacceptable for Xpert Xpress SARS-CoV-2/FLU/RSV testing.  Fact Sheet for Patients: EntrepreneurPulse.com.au  Fact Sheet for Healthcare Providers: IncredibleEmployment.be  This test is not yet approved or cleared by the [place] FDA and has been authorized for detection and/or diagnosis of SARS-CoV-2 by FDA under an Emergency Use Authorization (EUA). This EUA will remain in effect (meaning this test can be used) for the duration of the COVID-19 declaration under Section 564(b)(1) of the Act, 21 U.S.C. section 360bbb-3(b)(1), unless the authorization is terminated or revoked.  Performed at [facility], [place] [address], [place], [place] [address]          Radiology Studies: [facility] Chest Port 1 View  Result Date: 01/27/2021 CLINICAL DATA:  Shortness of breath and cough. EXAM: PORTABLE CHEST 1 VIEW  COMPARISON:  Chest radiograph dated 01/14/2021 FINDINGS: No focal consolidation, pleural effusion or pneumothorax. The cardiac silhouette is within normal limits. No acute osseous pathology. IMPRESSION: No active disease. Electronically Signed   By: Anner Crete M.D.   On: 01/27/2021 01:46        Scheduled Meds:  aspirin EC  81 mg Oral Daily   atorvastatin  40 mg Oral Daily   azithromycin  500 mg Oral Daily   enoxaparin (LOVENOX) injection  40 mg Subcutaneous Q24H   guaiFENesin  600 mg Oral BID   insulin aspart  0-5 Units Subcutaneous QHS   insulin aspart  0-9 Units Subcutaneous TID WC   ipratropium-albuterol  3 mL Nebulization TID   melatonin  3 mg Oral QHS   predniSONE  40 mg Oral Q breakfast   Continuous Infusions:  cefTRIAXone (ROCEPHIN)  IV 1 g (01/28/21 0455)     LOS: 0 days    Greater than 50% of this time was spent in counseling, explanation of diagnosis, planning of further management, and coordination of care.   Voice Recognition Viviann Spare dictation system was used to create this note, attempts have been made to correct errors. Please contact the author with questions and/or clarifications.   Florencia Reasons, MD PhD FACP [facility]  Available via Epic secure chat 7am-7pm for nonurgent issues Please page for urgent issues To page the attending provider between 7A-7P or the covering provider during after hours 7P-7A, please log into the web site [email or web address] and access using universal [place] password for that web site. If you do not have the password, please call the hospital operator.    01/28/2021, 6:52 PM

## 2021-01-29 LAB — GLUCOSE, CAPILLARY
Glucose-Capillary: 119 mg/dL — ABNORMAL HIGH (ref 70–99)
Glucose-Capillary: 112 mg/dL — ABNORMAL HIGH (ref 70–99)
Glucose-Capillary: 148 mg/dL — ABNORMAL HIGH (ref 70–99)
Glucose-Capillary: 156 mg/dL — ABNORMAL HIGH (ref 70–99)

## 2021-01-29 LAB — BASIC METABOLIC PANEL
Anion gap: 7 (ref 5–15)
BUN: 26 mg/dL — ABNORMAL HIGH (ref 6–20)
CO2: 24 mmol/L (ref 22–32)
Calcium: 8.6 mg/dL — ABNORMAL LOW (ref 8.9–10.3)
Chloride: 103 mmol/L (ref 98–111)
Creatinine, Ser: 0.78 mg/dL (ref 0.61–1.24)
GFR, Estimated: >60 mL/min (ref >60)
Glucose, Bld: 145 mg/dL — ABNORMAL HIGH (ref 70–99)
Potassium: 3.7 mmol/L (ref 3.5–5.1)
Sodium: 134 mmol/L — ABNORMAL LOW (ref 135–145)

## 2021-01-29 LAB — MAGNESIUM: Magnesium: 2.2 mg/dL (ref 1.7–2.4)

## 2021-01-29 MED ORDER — SENNOSIDES-DOCUSATE SODIUM 8.6-50 MG PO TABS
2.0000 | ORAL_TABLET | Freq: Two times a day (BID) | ORAL | Status: DC
  Administered 2021-01-29 – 2021-01-30 (×3): 2 via ORAL
  Filled 2021-01-29 (×3): qty 2

## 2021-01-29 MED ORDER — BUDESONIDE 0.25 MG/2ML IN SUSP
0.2500 mg | Freq: Two times a day (BID) | RESPIRATORY_TRACT | Status: DC
  Administered 2021-01-29 – 2021-01-30 (×2): 0.25 mg via RESPIRATORY_TRACT
  Filled 2021-01-29 (×2): qty 2

## 2021-01-29 MED ORDER — MILK AND MOLASSES ENEMA
1.0000 | Freq: Once | RECTAL | Status: AC
  Administered 2021-01-29: 240 mL via RECTAL
  Filled 2021-01-29: qty 240

## 2021-01-29 NOTE — Progress Notes (Signed)
PROGRESS NOTE  Alan Mendoza JQB:[id number] DOB: [birth year] DOA: 01/27/2021 PCP: Pcp, No   LOS: 1 day   Brief Narrative / Interim history: 56 year old male with deafness, COPD, CAD, ongoing tobacco use, diabetes mellitus, comes to the hospital with several days of shortness of breath.  Chest x-ray on admission was clear.  He was admitted for COPD exacerbation  Subjective / 24h Interval events: Doing well this morning, feels okay at rest but gets very short of breath with minimal activity  Assessment & Plan: Principal problem COPD exacerbation-continues to be symptomatic today, continue antibiotics, steroids, nebulizers.  Supportive treatment  Active problems Type 2 diabetes mellitus-continue sliding scale, he is on steroids  CBG (last 3)  Recent Labs    01/28/21 1723 01/28/21 2134 01/29/21 0734  GLUCAP 144* 145* 119*    CAD-no chest pain, this appears stable  Tobacco use-counseled for cessation.  Smokes about 5 cigarettes/day, tells me he has cut down significantly  Constipation-Senokot, enema.  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  Scheduled Meds:  aspirin EC  81 mg Oral Daily   atorvastatin  40 mg Oral Daily   azithromycin  500 mg Oral Daily   budesonide (PULMICORT) nebulizer solution  0.25 mg Nebulization BID   enoxaparin (LOVENOX) injection  40 mg Subcutaneous Q24H   guaiFENesin  600 mg Oral BID   insulin aspart  0-5 Units Subcutaneous QHS   insulin aspart  0-9 Units Subcutaneous TID WC   ipratropium-albuterol  3 mL Nebulization TID   melatonin  3 mg Oral QHS   milk and molasses  1 enema Rectal Once   predniSONE  40 mg Oral Q breakfast   senna-docusate  2 tablet Oral BID   Continuous Infusions:  cefTRIAXone (ROCEPHIN)  IV Stopped (01/29/21 0436)   PRN Meds:.acetaminophen **OR** acetaminophen, levalbuterol, senna-docusate  Diet Orders (From admission, onward)     Start     Ordered   01/27/21 0436  Diet heart  healthy/carb modified Room service appropriate? Yes; Fluid consistency: Thin  Diet effective now       Question Answer Comment  Diet-HS Snack? Nothing   Room service appropriate? Yes   Fluid consistency: Thin      01/27/21 0436            DVT prophylaxis: enoxaparin (LOVENOX) injection 40 mg Start: 01/27/21 1000   Lab Results  Component Value Date   PLT 290 01/28/2021      Code Status: Full Code  Family Communication: No family at bedside  Status is: Inpatient  Remains inpatient appropriate because: Persistent symptoms  Level of care: Med-Surg  Consultants:  None  Procedures:  none  Microbiology  none  Antimicrobials: Ceftriaxone / Azithromycin    Objective: Vitals:   01/28/21 1944 01/28/21 2138 01/29/21 0716 01/29/21 0750  BP:  117/75 116/75   Pulse:  (!) 102 88   Resp:  20 18   Temp:  98.5 F (36.9 C) (!) 97.5 F (36.4 C)   TempSrc:  Oral Oral   SpO2: 98% 95% 93% 94%  Weight:      Height:        Intake/Output Summary (Last 24 hours) at 01/29/2021 0956 Last data filed at 01/29/2021 4097 Gross per 24 hour  Intake 1740 ml  Output --  Net 1740 ml   Wt Readings from Last 3 Encounters:  01/27/21 74.4 kg  01/14/21 73.9 kg  09/27/20 72.6 kg    Examination:  Constitutional: NAD Eyes: no scleral icterus ENMT:  Mucous membranes are moist.  Neck: normal, supple Respiratory: Faint end expiratory wheezing, no crackles. Cardiovascular: Regular rate and rhythm, no murmurs / rubs / gallops. No LE edema. Abdomen: non distended, no tenderness. Bowel sounds positive.  Musculoskeletal: no clubbing / cyanosis.  Skin: no rashes Neurologic: Nonfocal   Data Reviewed: I have independently reviewed following labs and imaging studies  CBC Recent Labs  Lab 01/27/21 0139 01/28/21 0317  WBC 6.8 10.6*  HGB 15.1 13.8  HCT 48.9 44.2  PLT 263 290  MCV 88.9 88.9  MCH 27.5 27.8  MCHC 30.9 31.2  RDW 12.8 12.8    Recent Labs  Lab 01/27/21 0139  01/28/21 0317 01/29/21 0308  NA 139 139 134*  K 3.8 4.2 3.7  CL 107 106 103  CO2 24 24 24   GLUCOSE 120* 162* 145*  BUN 18 26* 26*  CREATININE 0.68 0.75 0.78  CALCIUM 9.2 9.3 8.6*  MG  --   --  2.2  DDIMER 0.27  --   --     ------------------------------------------------------------------------------------------------------------------ No results for input(s): CHOL, HDL, LDLCALC, TRIG, CHOLHDL, LDLDIRECT in the last 72 hours.  Lab Results  Component Value Date   HGBA1C 6.8 (H) 06/07/2020   ------------------------------------------------------------------------------------------------------------------ No results for input(s): TSH, T4TOTAL, T3FREE, THYROIDAB in the last 72 hours.  Invalid input(s): FREET3  Cardiac Enzymes No results for input(s): CKMB, TROPONINI, MYOGLOBIN in the last 168 hours.  Invalid input(s): CK ------------------------------------------------------------------------------------------------------------------    Component Value Date/Time   BNP 14.2 09/20/2020 1713    CBG: Recent Labs  Lab 01/28/21 0745 01/28/21 1153 01/28/21 1723 01/28/21 2134 01/29/21 0734  GLUCAP 160* 170* 144* 145* 119*    Recent Results (from the past 240 hour(s))  Resp Panel by RT-PCR (Flu A&B, Covid) Nasopharyngeal Swab     Status: None   Collection Time: 01/27/21  1:25 AM   Specimen: Nasopharyngeal Swab; Nasopharyngeal(NP) swabs in vial transport medium  Result Value Ref Range Status   SARS Coronavirus 2 by RT PCR NEGATIVE NEGATIVE Final    Comment: (NOTE) SARS-CoV-2 target nucleic acids are NOT DETECTED.  The SARS-CoV-2 RNA is generally detectable in upper respiratory specimens during the acute phase of infection. The lowest concentration of SARS-CoV-2 viral copies this assay can detect is 138 copies/mL. A negative result does not preclude SARS-Cov-2 infection and should not be used as the sole basis for treatment or other patient management decisions. A  negative result may occur with  improper specimen collection/handling, submission of specimen other than nasopharyngeal swab, presence of viral mutation(s) within the areas targeted by this assay, and inadequate number of viral copies(<138 copies/mL). A negative result must be combined with clinical observations, patient history, and epidemiological information. The expected result is Negative.  Fact Sheet for Patients:  EntrepreneurPulse.com.au  Fact Sheet for Healthcare Providers:  IncredibleEmployment.be  This test is no t yet approved or cleared by the [place] FDA and  has been authorized for detection and/or diagnosis of SARS-CoV-2 by FDA under an Emergency Use Authorization (EUA). This EUA will remain  in effect (meaning this test can be used) for the duration of the COVID-19 declaration under Section 564(b)(1) of the Act, 21 U.S.C.section 360bbb-3(b)(1), unless the authorization is terminated  or revoked sooner.       Influenza A by PCR NEGATIVE NEGATIVE Final   Influenza B by PCR NEGATIVE NEGATIVE Final    Comment: (NOTE) The Xpert Xpress SARS-CoV-2/FLU/RSV plus assay is intended as an aid in the diagnosis of influenza from  Nasopharyngeal swab specimens and should not be used as a sole basis for treatment. Nasal washings and aspirates are unacceptable for Xpert Xpress SARS-CoV-2/FLU/RSV testing.  Fact Sheet for Patients: EntrepreneurPulse.com.au  Fact Sheet for Healthcare Providers: IncredibleEmployment.be  This test is not yet approved or cleared by the [place] FDA and has been authorized for detection and/or diagnosis of SARS-CoV-2 by FDA under an Emergency Use Authorization (EUA). This EUA will remain in effect (meaning this test can be used) for the duration of the COVID-19 declaration under Section 564(b)(1) of the Act, 21 U.S.C. section 360bbb-3(b)(1), unless the authorization  is terminated or revoked.  Performed at [facility], [place] [address], [place], [place] [address]      Radiology Studies: No results found.   Marzetta Board, MD, PhD [facility]  Between 7 am - 7 pm I am available, please contact me via Amion (for emergencies) or Securechat (non urgent messages)  Between 7 pm - 7 am I am not available, please contact night coverage MD/APP via Amion

## 2021-01-29 NOTE — Plan of Care (Signed)
°  Problem: Activity: Goal: Ability to tolerate increased activity will improve Outcome: Progressing   Problem: Respiratory: Goal: Ability to maintain a clear airway will improve Outcome: Progressing   Problem: Education: Goal: Knowledge of General Education information will improve Description: Including pain rating scale, medication(s)/side effects and non-pharmacologic comfort measures Outcome: Progressing   Problem: Health Behavior/Discharge Planning: Goal: Ability to manage health-related needs will improve Outcome: Progressing

## 2021-01-30 DIAGNOSIS — H9193 Unspecified hearing loss, bilateral: Secondary | ICD-10-CM

## 2021-01-30 DIAGNOSIS — B0689 Other rubella complications: Secondary | ICD-10-CM

## 2021-01-30 LAB — GLUCOSE, CAPILLARY: Glucose-Capillary: 110 mg/dL — ABNORMAL HIGH (ref 70–99)

## 2021-01-30 MED ORDER — DOXYCYCLINE HYCLATE 100 MG PO CAPS: 100.0000 mg | ORAL_CAPSULE | Freq: Two times a day (BID) | ORAL | 0 refills | Status: AC

## 2021-01-30 MED ORDER — PREDNISONE 20 MG PO TABS: 20.0000 mg | ORAL_TABLET | Freq: Every day | ORAL | 0 refills | Status: AC

## 2021-01-30 NOTE — TOC Transition Note (Signed)
Transition of Care [facility]) - CM/SW Discharge Note   Patient Details  Name: Alan Mendoza MRN: [id number] Date of Birth: [birth year]  [facility] CM/SW Contact:  Ross Ludwig, LCSW Phone Number: 01/30/2021, 11:01 AM   Clinical Narrative:     CSW was informed that patient will need a ride home.  Patient says he cannot afford a ride and does not have anyone else who can pick him up.  CSW offered a bus pass, however patient is deaf, CSW was able to provide a cab voucher for patient to return back home.  CSW signing off, please reconsult if any other social work needs.     Barriers to Discharge: No Barriers Identified   Patient Goals and CMS Choice     Choice offered to / list presented to : NA  Discharge Placement                       Discharge Plan and Services In-house Referral: Clinical Social Work   Post Acute Care Choice: NA          DME Arranged: N/A DME Agency: NA                  Social Determinants of Health (SDOH) Interventions     Readmission Risk Interventions No flowsheet data found.

## 2021-01-30 NOTE — Discharge Summary (Signed)
Physician Discharge Summary  Alan Mendoza UXL:[id number] DOB: [birth year] DOA: 01/27/2021  PCP: Pcp, No  Admit date: 01/27/2021 Discharge date: 01/30/2021  Admitted From: home Disposition:  home  Recommendations for Outpatient Follow-up:  Follow up with PCP in 1-2 weeks  Home Health: none Equipment/Devices: none  Discharge Condition: stable CODE STATUS: Full code Diet recommendation: regular  HPI: Per admitting MD, Alan Mendoza is a pleasant 56 y.o. male with medical history significant for COPD, CAD, tobacco abuse, deafness, and type 2 diabetes mellitus, now presenting to the emergency department for evaluation of shortness of breath and cough.  Patient reports that his chronic dyspnea and cough has been worse than usual for close to 1 month, improved for several days with a short course of prednisone, but then worsened again over the past 3 to 4 days.  He has increased cough with sputum production.  Denies fevers, chills, or chest pain.  He has had cramping pain in the lower right leg recently and had a negative venous Doppler study.  He has cut back significantly on his tobacco use, and down to only a couple cigarettes per day.  Hospital Course / Discharge diagnoses: Principal problem COPD exacerbation-patient was admitted to the hospital with dyspnea, wheezing, and treated for COPD exacerbation. He was placed on steroids, antibiotics with significant improvement in his respiratory status. Wheezing has resolved, will be discharged home in stable condition. He is to finish 2 more days of antibiotics and steroids upon discharge  Active problems Type 2 diabetes mellitus-resume home medications CAD-no chest pain, this appears stable Tobacco use-counseled for cessation.  Smokes about 5 cigarettes/day, tells me he has cut down significantly Constipation-Senokot, enema. 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 What changed: Another medication with the same name was removed. Continue taking this medication, and follow the directions you see here.   atorvastatin 40 MG tablet Commonly known as: LIPITOR Take 1 tablet (40 mg total) by mouth daily.   doxycycline 100 MG capsule Commonly known as: Vibramycin Take 1 capsule (100 mg total) by mouth 2 (two) times daily for 2 days. Start taking on: January 31, 2021   metFORMIN 500 MG tablet Commonly known as: GLUCOPHAGE Take 1 tablet (500 mg total) by mouth 2 (two) times daily with a meal.   predniSONE 20 MG tablet Commonly known as: DELTASONE Take 1 tablet (20 mg total) by mouth daily with breakfast for 3 days. Start taking on: January 31, 2021 What changed:  how much to take how to take this when to take this additional instructions   Trelegy Ellipta 100-62.5-25 MCG/ACT Aepb Generic drug: Fluticasone-Umeclidin-Vilant Take 1 puff by mouth daily.        Follow-up [facility] up.   Why: Call to schedule a new patient appointment in order to be set up with a PCP. Contact information: [address] [place] [address] [phone]  Consultations: none  Procedures/Studies:  DG Chest 2 View  Result Date: 01/14/2021 CLINICAL DATA:  Cough, shortness of breath EXAM: CHEST - 2 VIEW COMPARISON:  09/27/2020 FINDINGS: Cardiac size is within normal limits. There are no signs of pulmonary edema or focal pulmonary consolidation. There is no pleural  effusion or pneumothorax. IMPRESSION: No active cardiopulmonary disease. Electronically Signed   By: Elmer Picker M.D.   On: 01/14/2021 18:31   US Venous Img Lower Unilateral Right (DVT)  Result Date: 01/20/2021 CLINICAL DATA:  56 year old male with right leg pain. EXAM: RIGHT LOWER EXTREMITY VENOUS DOPPLER ULTRASOUND TECHNIQUE: Gray-scale sonography with graded compression, as well as color Doppler and duplex ultrasound were performed to evaluate the right lower extremity deep venous systems from the level of the common femoral vein and including the common femoral, femoral, profunda femoral, popliteal and calf veins including the posterior tibial, peroneal and gastrocnemius veins when visible. Spectral Doppler was utilized to evaluate flow at rest and with distal augmentation maneuvers in the common femoral, femoral and popliteal veins. The contralateral common femoral vein was also evaluated for comparison. COMPARISON:  None. FINDINGS: RIGHT LOWER EXTREMITY Common Femoral Vein: No evidence of thrombus. Normal compressibility, respiratory phasicity and response to augmentation. Central Greater Saphenous Vein: No evidence of thrombus. Normal compressibility and flow on color Doppler imaging. Central Profunda Femoral Vein: No evidence of thrombus. Normal compressibility and flow on color Doppler imaging. Femoral Vein: No evidence of thrombus. Normal compressibility, respiratory phasicity and response to augmentation. Popliteal Vein: No evidence of thrombus. Normal compressibility, respiratory phasicity and response to augmentation. Calf Veins: Limited visualization of the peroneal vein. No evidence of thrombus. Normal compressibility and flow on color Doppler imaging. Other Findings:  None. LEFT LOWER EXTREMITY Common Femoral Vein: No evidence of thrombus. Normal compressibility, respiratory phasicity and response to augmentation. IMPRESSION: No evidence of right lower extremity deep venous thrombosis.  Ruthann Cancer, MD [facility] Radiology Electronically Signed   By: Ruthann Cancer M.D.   On: 01/20/2021 16:13   DG Chest Port 1 View  Result Date: 01/27/2021 CLINICAL DATA:  Shortness of breath and cough. EXAM: PORTABLE CHEST 1 VIEW COMPARISON:  Chest radiograph dated 01/14/2021 FINDINGS: No focal consolidation, pleural effusion or pneumothorax. The cardiac silhouette is within normal limits. No acute osseous pathology. IMPRESSION: No active disease. Electronically Signed   By: Anner Crete M.D.   On: 01/27/2021 01:46     Subjective: - no chest pain, shortness of breath, no abdominal pain, nausea or vomiting.   Discharge Exam: BP 122/88 (BP Location: Left Arm)    Pulse 64    Temp 97.6 F (36.4 C)    Resp 16    Ht 5\' 7"  (1.702 m)    Wt 74.4 kg    SpO2 94%    BMI 25.69 kg/m   General: Pt is alert, awake, not in acute distress Cardiovascular: RRR, S1/S2 +, no rubs, no gallops Respiratory: CTA bilaterally, no wheezing, no rhonchi Abdominal: Soft, NT, ND, bowel sounds + Extremities: no edema, no cyanosis   The results of significant diagnostics from this hospitalization (including imaging, microbiology, ancillary and laboratory) are listed below for reference.     Microbiology: Recent Results (from the past 240 hour(s))  Resp Panel by RT-PCR (Flu A&B, Covid) Nasopharyngeal Swab     Status: None   Collection Time: 01/27/21  1:25 AM   Specimen: Nasopharyngeal Swab; Nasopharyngeal(NP) swabs in vial transport medium  Result Value Ref Range Status  SARS Coronavirus 2 by RT PCR NEGATIVE NEGATIVE Final    Comment: (NOTE) SARS-CoV-2 target nucleic acids are NOT DETECTED.  The SARS-CoV-2 RNA is generally detectable in upper respiratory specimens during the acute phase of infection. The lowest concentration of SARS-CoV-2 viral copies this assay can detect is 138 copies/mL. A negative result does not preclude SARS-Cov-2 infection and should  not be used as the sole basis for treatment or other patient management decisions. A negative result may occur with  improper specimen collection/handling, submission of specimen other than nasopharyngeal swab, presence of viral mutation(s) within the areas targeted by this assay, and inadequate number of viral copies(<138 copies/mL). A negative result must be combined with clinical observations, patient history, and epidemiological information. The expected result is Negative.  Fact Sheet for Patients:  EntrepreneurPulse.com.au  Fact Sheet for Healthcare Providers:  IncredibleEmployment.be  This test is no t yet approved or cleared by the [place] FDA and  has been authorized for detection and/or diagnosis of SARS-CoV-2 by FDA under an Emergency Use Authorization (EUA). This EUA will remain  in effect (meaning this test can be used) for the duration of the COVID-19 declaration under Section 564(b)(1) of the Act, 21 U.S.C.section 360bbb-3(b)(1), unless the authorization is terminated  or revoked sooner.       Influenza A by PCR NEGATIVE NEGATIVE Final   Influenza B by PCR NEGATIVE NEGATIVE Final    Comment: (NOTE) The Xpert Xpress SARS-CoV-2/FLU/RSV plus assay is intended as an aid in the diagnosis of influenza from Nasopharyngeal swab specimens and should not be used as a sole basis for treatment. Nasal washings and aspirates are unacceptable for Xpert Xpress SARS-CoV-2/FLU/RSV testing.  Fact Sheet for Patients: EntrepreneurPulse.com.au  Fact Sheet for Healthcare Providers: IncredibleEmployment.be  This test is not yet approved or cleared by the [place] FDA and has been authorized for detection and/or diagnosis of SARS-CoV-2 by FDA under an Emergency Use Authorization (EUA). This EUA will remain in effect (meaning this test can be used) for the duration of the COVID-19 declaration under  Section 564(b)(1) of the Act, 21 U.S.C. section 360bbb-3(b)(1), unless the authorization is terminated or revoked.  Performed at [facility], [place] [address], [place], [place] [address]      Labs: Basic Metabolic Panel: Recent Labs  Lab 01/27/21 0139 01/28/21 0317 01/29/21 0308  NA 139 139 134*  K 3.8 4.2 3.7  CL 107 106 103  CO2 24 24 24   GLUCOSE 120* 162* 145*  BUN 18 26* 26*  CREATININE 0.68 0.75 0.78  CALCIUM 9.2 9.3 8.6*  MG  --   --  2.2   Liver Function Tests: No results for input(s): AST, ALT, ALKPHOS, BILITOT, PROT, ALBUMIN in the last 168 hours. CBC: Recent Labs  Lab 01/27/21 0139 01/28/21 0317  WBC 6.8 10.6*  HGB 15.1 13.8  HCT 48.9 44.2  MCV 88.9 88.9  PLT 263 290   CBG: Recent Labs  Lab 01/29/21 0734 01/29/21 1124 01/29/21 1712 01/29/21 2126 01/30/21 0806  GLUCAP 119* 112* 148* 156* 110*   Hgb A1c No results for input(s): HGBA1C in the last 72 hours. Lipid Profile No results for input(s): CHOL, HDL, LDLCALC, TRIG, CHOLHDL, LDLDIRECT in the last 72 hours. Thyroid function studies No results for input(s): TSH, T4TOTAL, T3FREE, THYROIDAB in the last 72 hours.  Invalid input(s): FREET3 Urinalysis    Component Value Date/Time   COLORURINE YELLOW 03/05/2014 1009   APPEARANCEUR CLOUDY (A) 03/05/2014 1009   LABSPEC 1.033 (H)  03/05/2014 1009   PHURINE 5.0 03/05/2014 1009   GLUCOSEU NEGATIVE 03/05/2014 1009   HGBUR NEGATIVE 03/05/2014 1009   BILIRUBINUR n 11/19/2014 0902   KETONESUR 15 (A) 03/05/2014 1009   PROTEINUR n 11/19/2014 0902   PROTEINUR NEGATIVE 03/05/2014 1009   UROBILINOGEN 1.0 11/19/2014 0902   UROBILINOGEN 0.2 03/05/2014 1009   NITRITE n 11/19/2014 0902   NITRITE NEGATIVE 03/05/2014 1009   LEUKOCYTESUR Negative 11/19/2014 0902    FURTHER DISCHARGE INSTRUCTIONS:   Get Medicines reviewed and adjusted: Please take all your medications with you for your next visit with your Primary MD    Laboratory/radiological data: Please request your Primary MD to go over all hospital tests and procedure/radiological results at the follow up, please ask your Primary MD to get all Hospital records sent to his/her office.   In some cases, they will be blood work, cultures and biopsy results pending at the time of your discharge. Please request that your primary care M.D. goes through all the records of your hospital data and follows up on these results.   Also Note the following: If you experience worsening of your admission symptoms, develop shortness of breath, life threatening emergency, suicidal or homicidal thoughts you must seek medical attention immediately by calling 911 or calling your MD immediately  if symptoms less severe.   You must read complete instructions/literature along with all the possible adverse reactions/side effects for all the Medicines you take and that have been prescribed to you. Take any new Medicines after you have completely understood and accpet all the possible adverse reactions/side effects.    Do not drive when taking Pain medications or sleeping medications (Benzodaizepines)   Do not take more than prescribed Pain, Sleep and Anxiety Medications. It is not advisable to combine anxiety,sleep and pain medications without talking with your primary care practitioner   Special Instructions: If you have smoked or chewed Tobacco  in the last 2 yrs please stop smoking, stop any regular Alcohol  and or any Recreational drug use.   Wear Seat belts while driving.   Please note: You were cared for by a hospitalist during your hospital stay. Once you are discharged, your primary care physician will handle any further medical issues. Please note that NO REFILLS for any discharge medications will be authorized once you are discharged, as it is imperative that you return to your primary care physician (or establish a relationship with a primary care physician if you do not  have one) for your post hospital discharge needs so that they can reassess your need for medications and monitor your lab values.  Time coordinating discharge: 40 minutes  SIGNED:  Marzetta Board, MD, PhD 01/30/2021, 8:30 AM

## 2021-03-31 ENCOUNTER — Other Ambulatory Visit: Payer: Self-pay | Admitting: Family Medicine

## 2021-05-20 ENCOUNTER — Emergency Department (HOSPITAL_COMMUNITY): Payer: Medicare Other

## 2021-05-20 ENCOUNTER — Other Ambulatory Visit: Payer: Self-pay

## 2021-05-20 ENCOUNTER — Encounter (HOSPITAL_COMMUNITY): Payer: Self-pay | Admitting: Emergency Medicine

## 2021-05-20 ENCOUNTER — Emergency Department (HOSPITAL_COMMUNITY)
Admission: EM | Admit: 2021-05-20 | Discharge: 2021-05-20 | Disposition: A | Discharge status: Home / Self Care | Payer: Medicare Other | Attending: Emergency Medicine | Admitting: Emergency Medicine

## 2021-05-20 DIAGNOSIS — R0602 Shortness of breath: Secondary | ICD-10-CM | POA: Diagnosis present

## 2021-05-20 DIAGNOSIS — E119 Type 2 diabetes mellitus without complications: Secondary | ICD-10-CM | POA: Diagnosis not present

## 2021-05-20 DIAGNOSIS — Z7984 Long term (current) use of oral hypoglycemic drugs: Secondary | ICD-10-CM | POA: Insufficient documentation

## 2021-05-20 DIAGNOSIS — H9193 Unspecified hearing loss, bilateral: Secondary | ICD-10-CM

## 2021-05-20 DIAGNOSIS — R000 Tachycardia, unspecified: Secondary | ICD-10-CM | POA: Insufficient documentation

## 2021-05-20 DIAGNOSIS — J441 Chronic obstructive pulmonary disease with (acute) exacerbation: Secondary | ICD-10-CM

## 2021-05-20 DIAGNOSIS — Z720 Tobacco use: Secondary | ICD-10-CM

## 2021-05-20 DIAGNOSIS — I2510 Atherosclerotic heart disease of native coronary artery without angina pectoris: Secondary | ICD-10-CM | POA: Diagnosis not present

## 2021-05-20 LAB — CBC
HCT: 45.4 % (ref 39.0–52.0)
Hemoglobin: 14.1 g/dL (ref 13.0–17.0)
MCH: 28.2 pg (ref 26.0–34.0)
MCHC: 31.1 g/dL (ref 30.0–36.0)
MCV: 90.8 fL (ref 80.0–100.0)
Platelets: 297 10*3/uL (ref 150–400)
RBC: 5 MIL/uL (ref 4.22–5.81)
RDW: 12.7 % (ref 11.5–15.5)
WBC: 8.6 10*3/uL (ref 4.0–10.5)
nRBC: 0 % (ref 0.0–0.2)

## 2021-05-20 LAB — BASIC METABOLIC PANEL
Anion gap: 7 (ref 5–15)
BUN: 19 mg/dL (ref 6–20)
CO2: 25 mmol/L (ref 22–32)
Calcium: 8.9 mg/dL (ref 8.9–10.3)
Chloride: 110 mmol/L (ref 98–111)
Creatinine, Ser: 0.73 mg/dL (ref 0.61–1.24)
GFR, Estimated: >60 mL/min (ref >60)
Glucose, Bld: 136 mg/dL — ABNORMAL HIGH (ref 70–99)
Potassium: 4 mmol/L (ref 3.5–5.1)
Sodium: 142 mmol/L (ref 135–145)

## 2021-05-20 LAB — BRAIN NATRIURETIC PEPTIDE: B Natriuretic Peptide: 30.7 pg/mL (ref 0.0–100.0)

## 2021-05-20 MED ORDER — AEROCHAMBER Z-STAT PLUS/MEDIUM MISC
1.0000 | Freq: Once | Status: AC
  Administered 2021-05-20: 1
  Filled 2021-05-20: qty 1

## 2021-05-20 MED ORDER — METHYLPREDNISOLONE SODIUM SUCC 125 MG IJ SOLR: 125.0000 mg | Freq: Once | INTRAMUSCULAR | Status: DC

## 2021-05-20 MED ORDER — MAGNESIUM SULFATE 2 GM/50ML IV SOLN
2.0000 g | Freq: Once | INTRAVENOUS | Status: AC
  Administered 2021-05-20: 2 g via INTRAVENOUS
  Filled 2021-05-20: qty 50

## 2021-05-20 MED ORDER — ALBUTEROL SULFATE (2.5 MG/3ML) 0.083% IN NEBU: 5.0000 mg | INHALATION_SOLUTION | RESPIRATORY_TRACT | Status: DC | PRN

## 2021-05-20 MED ORDER — PREDNISONE 10 MG (21) PO TBPK: ORAL_TABLET | Freq: Every day | ORAL | 0 refills | Status: DC

## 2021-05-20 MED ORDER — ALBUTEROL SULFATE HFA 108 (90 BASE) MCG/ACT IN AERS: 2.0000 | INHALATION_SPRAY | Freq: Four times a day (QID) | RESPIRATORY_TRACT | 2 refills | Status: DC | PRN

## 2021-05-20 MED ORDER — ALBUTEROL SULFATE HFA 108 (90 BASE) MCG/ACT IN AERS
2.0000 | INHALATION_SPRAY | RESPIRATORY_TRACT | Status: DC | PRN
  Administered 2021-05-20: 2 via RESPIRATORY_TRACT
  Filled 2021-05-20: qty 6.7

## 2021-05-20 MED ORDER — IPRATROPIUM BROMIDE 0.02 % IN SOLN
0.5000 mg | Freq: Once | RESPIRATORY_TRACT | Status: AC
  Administered 2021-05-20: 0.5 mg via RESPIRATORY_TRACT
  Filled 2021-05-20: qty 2.5

## 2021-05-20 NOTE — Discharge Instructions (Addendum)
Try to stop smoking. °

## 2021-05-20 NOTE — ED Triage Notes (Signed)
Patient presents with shortness of breath which started this am and has worsened throughout the day. He reports being out of his inhaler. EMS noted he was cold clammy and in respiratory distress. They administered Duoneb and 125 mg  solumedrol.   EMS vitals: 144/94 BP 108 HR 100 % SPO2 on room air 97.3 Temp 24 RR

## 2021-05-20 NOTE — ED Notes (Signed)
Patient refuses Resp Panel.

## 2021-05-20 NOTE — ED Provider Notes (Signed)
Pt singed out by Dr. Tomi Bamberger pending improvement in sx.  Pt is feeling much better and is back to nl.  Pt said he can't get his Trelegy inhaler until next month.  He is given an albuterol inhaler + spacer in the ED.  He is d/c with albuterol and prednisone.  He is encouraged to stop smoking.  Pt is stable for d/c.  He is to f/u with his pcp.  Due to language barrier, an interpreter was present during the history-taking and subsequent discussion (and for part of the physical exam) with this patient.    Isla Pence, MD 05/20/21 [phone]

## 2021-05-20 NOTE — ED Provider Notes (Signed)
[facility] DEPT Provider Note   CSN: [id number] Arrival date & time: 05/20/21  1355     History  Chief Complaint  Patient presents with   Shortness of Breath    Alan Mendoza is a 56 y.o. male.   Shortness of Breath Associated symptoms: no fever    Patient has a history of deafness due to [place], coronary artery disease, emphysema, chronic tobacco use, major depressive disorder, diabetes who presents to the ED with complaints of shortness of breath.  Patient states symptoms started this morning.  The symptoms have been worsening throughout the day.  Patient ran out of his inhaler.  Patient was feeling much worse until EMS administered a DuoNeb and Solu-Medrol.  Patient denies any chest pain or abdominal pain.  No leg swelling.  This feels similar to his previous COPD exacerbations  Home Medications Prior to Admission medications   Medication Sig Start Date End Date Taking? Authorizing Provider  albuterol (VENTOLIN HFA) 108 (90 Base) MCG/ACT inhaler Inhale 2 puffs into the lungs every 6 (six) hours as needed for wheezing or shortness of breath. 04/12/20   Lesleigh Noe, MD  atorvastatin (LIPITOR) 40 MG tablet Take 1 tablet (40 mg total) by mouth daily. 04/12/20   Lesleigh Noe, MD  metFORMIN (GLUCOPHAGE) 500 MG tablet Take 1 tablet (500 mg total) by mouth 2 (two) times daily with a meal. 07/08/20   Lesleigh Noe, MD  TRELEGY ELLIPTA 100-62.5-25 MCG/ACT AEPB Take 1 puff by mouth daily. 11/17/20   [provider]      Allergies    Patient has no known allergies.    Review of Systems   Review of Systems  Constitutional:  Negative for fever.  Respiratory:  Positive for shortness of breath.    Physical Exam Updated Vital Signs BP (!) 143/90 (BP Location: Right Arm)   Pulse (!) 113   Temp 98.3 F (36.8 C) (Oral)   Resp 17   SpO2 100%  Physical Exam Vitals and nursing note reviewed.  Constitutional:      Appearance: He is  well-developed.  HENT:     Head: Normocephalic and atraumatic.     Right Ear: External ear normal.     Left Ear: External ear normal.  Eyes:     General: No scleral icterus.       Right eye: No discharge.        Left eye: No discharge.     Conjunctiva/sclera: Conjunctivae normal.  Neck:     Trachea: No tracheal deviation.  Cardiovascular:     Rate and Rhythm: Regular rhythm. Tachycardia present.  Pulmonary:     Effort: Accessory muscle usage present. No respiratory distress.     Breath sounds: No stridor. Decreased breath sounds, wheezing and rhonchi present. No rales.  Abdominal:     General: Bowel sounds are normal. There is no distension.     Palpations: Abdomen is soft.     Tenderness: There is no abdominal tenderness. There is no guarding or rebound.  Musculoskeletal:        General: No tenderness or deformity.     Cervical back: Neck supple.  Skin:    General: Skin is warm and dry.     Findings: No rash.  Neurological:     General: No focal deficit present.     Mental Status: He is alert.     Cranial Nerves: No cranial nerve deficit (no facial droop, extraocular movements intact, no slurred speech).  Sensory: No sensory deficit.     Motor: No abnormal muscle tone or seizure activity.     Coordination: Coordination normal.  Psychiatric:        Mood and Affect: Mood normal.    ED Results / Procedures / Treatments   Labs (all labs ordered are listed, but only abnormal results are displayed) Labs Reviewed  RESP PANEL BY RT-PCR (FLU A&B, COVID) ARPGX2  CBC  BASIC METABOLIC PANEL  BRAIN NATRIURETIC PEPTIDE    EKG None  Radiology No results found.  Procedures Procedures    Medications Ordered in ED Medications  methylPREDNISolone sodium succinate (SOLU-MEDROL) 125 mg/2 mL injection 125 mg (has no administration in time range)  ipratropium (ATROVENT) nebulizer solution 0.5 mg (has no administration in time range)  albuterol (PROVENTIL) (2.5 MG/3ML)  0.083% nebulizer solution 5 mg (has no administration in time range)  magnesium sulfate IVPB 2 g 50 mL (has no administration in time range)    ED Course/ Medical Decision Making/ A&P                           Medical Decision Making DDX includes but not limited to PNA, COPD exacerbation, CHF exacerbation  Problems Addressed: Bilateral deafness: chronic illness or injury COPD exacerbation ([place]): acute illness or injury that poses a threat to life or bodily functions Tobacco abuse: chronic illness or injury  Amount and/or Complexity of Data Reviewed Labs: ordered. Radiology: ordered.  Risk Prescription drug management.   Pt presented with shortness of breath, hx of copd.  Breathing treatments ordered, already given tx by EMS with some improvements.  Labs xray pending at shift changed.  Care turned over to Dr Colvin Caroli          Final Clinical Impression(s) / ED Diagnoses Final diagnoses:  COPD exacerbation ([place])  Tobacco abuse  Bilateral deafness    Rx / DC Orders ED Discharge Orders     None         Dorie Rank, MD 05/23/21 [phone]

## 2021-07-29 ENCOUNTER — Emergency Department (HOSPITAL_COMMUNITY)
Admission: EM | Admit: 2021-07-29 | Discharge: 2021-07-29 | Disposition: A | Discharge status: Home / Self Care | Payer: Medicare Other | Attending: Emergency Medicine | Admitting: Emergency Medicine

## 2021-07-29 ENCOUNTER — Other Ambulatory Visit: Payer: Self-pay

## 2021-07-29 ENCOUNTER — Emergency Department (HOSPITAL_COMMUNITY): Payer: Medicare Other

## 2021-07-29 ENCOUNTER — Encounter (HOSPITAL_COMMUNITY): Payer: Self-pay | Admitting: Emergency Medicine

## 2021-07-29 DIAGNOSIS — M25512 Pain in left shoulder: Secondary | ICD-10-CM | POA: Insufficient documentation

## 2021-07-29 DIAGNOSIS — E119 Type 2 diabetes mellitus without complications: Secondary | ICD-10-CM | POA: Diagnosis not present

## 2021-07-29 DIAGNOSIS — I2510 Atherosclerotic heart disease of native coronary artery without angina pectoris: Secondary | ICD-10-CM | POA: Diagnosis not present

## 2021-07-29 DIAGNOSIS — Z7984 Long term (current) use of oral hypoglycemic drugs: Secondary | ICD-10-CM | POA: Diagnosis not present

## 2021-07-29 DIAGNOSIS — Z720 Tobacco use: Secondary | ICD-10-CM | POA: Diagnosis not present

## 2021-07-29 MED ORDER — LIDOCAINE 5 % EX PTCH
1.0000 | MEDICATED_PATCH | CUTANEOUS | Status: DC
  Administered 2021-07-29: 1 via TRANSDERMAL
  Filled 2021-07-29: qty 1

## 2021-07-29 MED ORDER — IBUPROFEN 200 MG PO TABS
600.0000 mg | ORAL_TABLET | Freq: Once | ORAL | Status: AC
  Administered 2021-07-29: 600 mg via ORAL
  Filled 2021-07-29: qty 3

## 2021-07-29 MED ORDER — IBUPROFEN 800 MG PO TABS: 800.0000 mg | ORAL_TABLET | Freq: Three times a day (TID) | ORAL | 0 refills | Status: DC

## 2021-07-29 MED ORDER — LIDOCAINE 5 % EX PTCH: 1.0000 | MEDICATED_PATCH | CUTANEOUS | 0 refills | Status: DC

## 2021-07-29 NOTE — ED Provider Triage Note (Signed)
Emergency Medicine Provider Triage Evaluation Note  Alan Mendoza , a 56 y.o. male  was evaluated in triage.  Pt complains of left arm pain.. Professional ASL interpreter used.  Patient states that about 2 weeks ago he was taking his trash out when he heaved it into the trash can and began having pain shortly after and his left arm.  He describes the pain as being in the left forearm and left shoulder.  Since then he has had ongoing pain and difficulty sleeping.  He has been unable to lift his arm without pain and states that when he "lets the arm hanging down" it also hurts then.  He has not had previous surgeries on the arm Review of Systems  Positive:  Negative:   Physical Exam  BP (!) 128/96   Pulse 91   Temp 98.1 F (36.7 C) (Oral)   Resp 18   SpO2 97%  Gen:   Awake, no distress   Resp:  Normal effort  MSK:   No deformity to the left arm.  There is no swelling of the joints.  Pulse intact.  Sensation intact.  Compartments soft.  He does have pain with extension and pain with abduction and extension of the shoulder Other:    Medical Decision Making  Medically screening exam initiated at 1:18 PM.  Appropriate orders placed.  Alan Mendoza was informed that the remainder of the evaluation will be completed by another provider, this initial triage assessment does not replace that evaluation, and the importance of remaining in the ED until their evaluation is complete.     Mickie Hillier, PA-C 07/29/21 1320

## 2021-07-29 NOTE — ED Provider Notes (Signed)
[facility] DEPT Provider Note   CSN: [id number] Arrival date & time: 07/29/21  1300     History  Chief Complaint  Patient presents with  . Arm Pain    Alan Mendoza is a 56 y.o. male.  The history is provided by the patient and medical records. The history is limited by a language barrier. A language interpreter was used.  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  Pain started 2 weeks ago while he is taking out trash.  He states daily he did heavy trash bag into the can start having pain shortly afterwards in his shoulder and into the forearm.  The pain is worse with movement and lifting his arm up, he also feels it at night at the end of the day.  No previous surgeries to the arm, denies any chest pain, shortness of breath, numbness or tingling, lateralized weakness, vision changes, headaches.  Patient has not tried any analgesics prior to arrival.    Home Medications Prior to Admission medications   Medication Sig Start Date End Date Taking? Authorizing Provider  albuterol (VENTOLIN HFA) 108 (90 Base) MCG/ACT inhaler Inhale 2 puffs into the lungs every 6 (six) hours as needed for wheezing or shortness of breath. 05/20/21   Isla Pence, MD  atorvastatin (LIPITOR) 40 MG tablet Take 1 tablet (40 mg total) by mouth daily. 04/12/20   Lesleigh Noe, MD  metFORMIN (GLUCOPHAGE) 500 MG tablet Take 1 tablet (500 mg total) by mouth 2 (two) times daily with a meal. 07/08/20   Lesleigh Noe, MD  predniSONE (STERAPRED UNI-PAK 21 TAB) 10 MG (21) TBPK tablet Take by mouth daily. Take 6 tabs by mouth daily  for 2 days, then 5 tabs for 2 days, then 4 tabs for 2 days, then 3 tabs for 2 days, 2 tabs for 2 days, then 1 tab by mouth daily for 2 days 05/20/21   Isla Pence, MD  TRELEGY ELLIPTA  100-62.5-25 MCG/ACT AEPB Take 1 puff by mouth daily. 11/17/20   [provider]      Allergies    Patient has no known allergies.    Review of Systems   Review of Systems  Physical Exam Updated Vital Signs BP 107/79 (BP Location: Left Arm)   Pulse 84   Temp 98.1 F (36.7 C) (Oral)   Resp 18   SpO2 95%  Physical Exam Vitals and nursing note reviewed. Exam conducted with a chaperone present.  Constitutional:      General: He is not in acute distress.    Appearance: Normal appearance.  HENT:     Head: Normocephalic and atraumatic.  Eyes:     General: No scleral icterus.    Extraocular Movements: Extraocular movements intact.     Pupils: Pupils are equal, round, and reactive to light.  Cardiovascular:     Rate and Rhythm: Normal rate and regular rhythm.     Pulses: Normal pulses.  Pulmonary:     Effort: Pulmonary effort is normal.  Musculoskeletal:        General: Tenderness present.     Comments: Tenderness over the [facility] joint to the left.  Decreased ROM with shoulder abduction and abduction.  No point tenderness over the forearm or olecranon, full ROM to elbow and wrist.  Skin:    Capillary Refill: Capillary refill takes less  than 2 seconds.     Coloration: Skin is not jaundiced.  Neurological:     Mental Status: He is alert. Mental status is at baseline.     Coordination: Coordination normal.    ED Results / Procedures / Treatments   Labs (all labs ordered are listed, but only abnormal results are displayed) Labs Reviewed - No data to display  EKG None  Radiology DG Forearm Left  Result Date: 07/29/2021 CLINICAL DATA:  Injury, pain EXAM: LEFT FOREARM - 2 VIEW COMPARISON:  07/29/2021 FINDINGS: Intact left radius and ulna. Normal alignment. No focal soft tissue abnormality. No significant arthropathy. IMPRESSION: Negative. Electronically Signed   By: Jerilynn Mages.  Shick M.D.   On: 07/29/2021 13:59   DG Shoulder Left  Result Date: 07/29/2021 CLINICAL DATA:  Intra,  left shoulder pain EXAM: LEFT SHOULDER - 2+ VIEW COMPARISON:  05/20/2021 FINDINGS: Normal alignment without acute osseous finding or fracture. No subluxation or dislocation. Mild AC joint degenerative change without separation. Stable soft tissue calcification along the proximal left humerus. Included left chest unremarkable. IMPRESSION: Degenerative changes as above.  No acute abnormality Electronically Signed   By: Jerilynn Mages.  Shick M.D.   On: 07/29/2021 13:58    Procedures Procedures    Medications Ordered in ED Medications  lidocaine (LIDODERM) 5 % 1 patch (has no administration in time range)  ibuprofen (ADVIL) tablet 600 mg (has no administration in time range)    ED Course/ Medical Decision Making/ A&P                           Medical Decision Making Risk OTC drugs. Prescription drug management.   Patient presents due to left shoulder pain.  Differential includes not limited to fracture, dislocation, muscle strain, atypical ACS.  Patient is neurovascular intact with brisk cap refill and radial pulse 2+.  Able to tolerate ROM, some tenderness over the [facility] joint.  Regular rhythm, lungs are clear to auscultation bilaterally vitals are stable.  I did review previous history, he had a previous MI and has ST depressions.  I repeated EKG today, no new changes compared to previous.  Do not think this is atypical ACS especially in the setting reproducible shoulder pain.  I ordered and viewed plain films of shoulder and forearm.  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    I ordered Lidoderm patch and ibuprofen.    Patient's renal function was normal when checked 2 months ago by his PCP.  Will discharge home with anti-inflammatories, Ortho follow-up and topical patches.  We discussed return precautions including numbness, tingling, weakness to one side of his body, chest pain, shortness of breath, back pain or vision  changes.  Virtual ASL translator used during visit.         Final Clinical Impression(s) / ED Diagnoses Final diagnoses:  None    Rx / DC Orders ED Discharge Orders     None         Sherrill Raring, [place] 07/29/21 1516    Tegeler, Gwenyth Allegra, MD 07/29/21 [phone]

## 2021-07-29 NOTE — Discharge Instructions (Addendum)
You have signs of osteoarthritis in your shoulder.  There is no signs of fracture or dislocation in the shoulder or your forearm.  Can try the Lidoderm patches over the shoulder to help with pain.  Take ibuprofen 800 mg 3 times daily for 1 week.  Take with food and water.  Stop if you develop any abdominal pain.  Follow-up with orthopedics, information provided above.  Call and schedule an appointment.  Return to the emergency department if you have any chest pain, back pain, loss of consciousness, numbness on 1 side of your body, vision changes, numbness or weakness to 1 side of your body.

## 2021-07-29 NOTE — ED Triage Notes (Signed)
Pt reports that 2 weeks ago he was taking the trash out when he injured his left arm. Pt reports pain in that left arm. Pt reports pain in both forearm and shoulder.

## 2021-08-23 ENCOUNTER — Encounter (HOSPITAL_COMMUNITY): Payer: Self-pay

## 2021-08-23 ENCOUNTER — Emergency Department (HOSPITAL_COMMUNITY)
Admission: EM | Admit: 2021-08-23 | Discharge: 2021-08-23 | Disposition: A | Discharge status: Home / Self Care | Payer: Medicare Other | Attending: Emergency Medicine | Admitting: Emergency Medicine

## 2021-08-23 ENCOUNTER — Other Ambulatory Visit: Payer: Self-pay

## 2021-08-23 DIAGNOSIS — Z7984 Long term (current) use of oral hypoglycemic drugs: Secondary | ICD-10-CM | POA: Diagnosis not present

## 2021-08-23 DIAGNOSIS — S91209A Unspecified open wound of unspecified toe(s) with damage to nail, initial encounter: Secondary | ICD-10-CM

## 2021-08-23 DIAGNOSIS — W19XXXA Unspecified fall, initial encounter: Secondary | ICD-10-CM | POA: Insufficient documentation

## 2021-08-23 DIAGNOSIS — S99921A Unspecified injury of right foot, initial encounter: Secondary | ICD-10-CM | POA: Diagnosis present

## 2021-08-23 DIAGNOSIS — S91201A Unspecified open wound of right great toe with damage to nail, initial encounter: Secondary | ICD-10-CM | POA: Insufficient documentation

## 2021-08-23 MED ORDER — OXYCODONE-ACETAMINOPHEN 5-325 MG PO TABS
1.0000 | ORAL_TABLET | Freq: Once | ORAL | Status: AC
  Administered 2021-08-23: 1 via ORAL
  Filled 2021-08-23: qty 1

## 2021-08-23 NOTE — ED Notes (Signed)
Used ASL interpreter for discharge instructions. Pt had no further questions or needs from me/provider.

## 2021-08-23 NOTE — ED Provider Triage Note (Signed)
Emergency Medicine Provider Triage Evaluation Note  Shahzaib Azevedo , a 56 y.o. male  was evaluated in triage.  Pt complains of right toenail about to fall off and has been painful for the past week. Denies any trauma, althought reports he did hit it today. Patient is diabetic.  Review of Systems  Positive:  Negative:   Physical Exam  BP 117/79 (BP Location: Right Arm)   Pulse 97   Temp 98.3 F (36.8 C) (Oral)   Resp 16   Ht 5\' 7"  (1.702 m)   Wt 68.9 kg   SpO2 95%   BMI 23.81 kg/m  Gen:   Awake, no distress   Resp:  Normal effort  MSK:   Moves extremities without difficulty  Other:  Toe discolored and looks be barely on the right great toe. Palpable pulses.   Medical Decision Making  Medically screening exam initiated at 6:54 PM.  Appropriate orders placed.  Torrence Hammack was informed that the remainder of the evaluation will be completed by another provider, this initial triage assessment does not replace that evaluation, and the importance of remaining in the ED until their evaluation is complete.  Interpreter used. Will order XR.   Sherrell Puller, [place] 08/23/21 1856

## 2021-08-23 NOTE — ED Triage Notes (Signed)
ASL interpreter used: Pt states his nail on his right great toe is falling off. Denies injury.

## 2021-08-23 NOTE — ED Provider Notes (Signed)
[facility] DEPT Provider Note   CSN: [id number] Arrival date & time: 08/23/21  1761     History  Chief Complaint  Patient presents with   Nail Problem    Alan Mendoza is a 56 y.o. 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  Patient reports that it has been falling off for the last week, reports that he hit it on something today and then nearly ripped all the way off.  Patient has not taken anything for pain prior to arrival.  He does not think that he hit it hard enough to break the toe.  HPI     Home Medications Prior to Admission medications   Medication Sig Start Date End Date Taking? Authorizing Provider  albuterol (VENTOLIN HFA) 108 (90 Base) MCG/ACT inhaler Inhale 2 puffs into the lungs every 6 (six) hours as needed for wheezing or shortness of breath. 05/20/21   Isla Pence, MD  atorvastatin (LIPITOR) 40 MG tablet Take 1 tablet (40 mg total) by mouth daily. 04/12/20   Lesleigh Noe, MD  ibuprofen (ADVIL) 800 MG tablet Take 1 tablet (800 mg total) by mouth 3 (three) times daily. 07/29/21   Sherrill Raring, PA-C  lidocaine (LIDODERM) 5 % Place 1 patch onto the skin daily. Remove & Discard patch within 12 hours or as directed by MD 07/29/21   Sherrill Raring, PA-C  metFORMIN (GLUCOPHAGE) 500 MG tablet Take 1 tablet (500 mg total) by mouth 2 (two) times daily with a meal. 07/08/20   Lesleigh Noe, MD  predniSONE (STERAPRED UNI-PAK 21 TAB) 10 MG (21) TBPK tablet Take by mouth daily. Take 6 tabs by mouth daily  for 2 days, then 5 tabs for 2 days, then 4 tabs for 2 days, then 3 tabs for 2 days, 2 tabs for 2 days, then 1 tab by mouth daily for 2 days 05/20/21   Isla Pence, MD  TRELEGY ELLIPTA 100-62.5-25 MCG/ACT AEPB Take 1 puff by mouth daily. 11/17/20    [provider]      Allergies    Patient has no known allergies.    Review of Systems   Review of Systems  All other systems reviewed and are negative.   Physical Exam Updated Vital Signs BP 117/79 (BP Location: Right Arm)   Pulse 97   Temp 98.3 F (36.8 C) (Oral)   Resp 16   Ht 5\' 7"  (1.702 m)   Wt 68.9 kg   SpO2 95%   BMI 23.81 kg/m  Physical Exam Vitals and nursing note reviewed.  Constitutional:      General: He is not in acute distress.    Appearance: Normal appearance.  HENT:     Head: Normocephalic and atraumatic.  Eyes:     General:        Right eye: No discharge.        Left eye: No discharge.  Cardiovascular:     Rate and Rhythm: Normal rate and regular rhythm.  Pulmonary:     Effort: Pulmonary effort is normal. No respiratory distress.  Musculoskeletal:        General: No deformity.     Comments: Intact flexion, extension of the toe  Skin:    General: Skin is warm and dry.     Capillary Refill: Capillary refill takes less than  2 seconds.     Comments: Patient with near total nail avulsion of right toe with some subungual blood noted at lateral inner corner of toenail. Patient with intact skin, no evidence of cellulitis.  Neurological:     Mental Status: He is alert and oriented to person, place, and time.  Psychiatric:        Mood and Affect: Mood normal.        Behavior: Behavior normal.     ED Results / Procedures / Treatments   Labs (all labs ordered are listed, but only abnormal results are displayed) Labs Reviewed - No data to display  EKG None  Radiology No results found.  Procedures Procedures    Medications Ordered in ED Medications  oxyCODONE-acetaminophen (PERCOCET/ROXICET) 5-325 MG per tablet 1 tablet (1 tablet Oral Given 08/23/21 2121)    ED Course/ Medical Decision Making/ A&P                           Medical Decision Making Risk Prescription drug management.   ASL interpreter used for the duration of  visit today  Overall well-appearing 56 year old male who presents with concern for partial nail avulsion of the right great toe after injury earlier today.  Patient reports it was starting to come off last week, and then he caught it on something today with lead to near total removal today.  Patient reports significant pain, no active bleeding at this time.  He does not take anything for pain  Patient with intact capillary refill, no evidence of cellulitis.  Discussed that if he hit his toe hard enough for the nails, I would recommend radiographs to ensure that there is no fracture of the affected toe.  Patient declines at this time.  Discussed that I recommend that he follow-up with podiatry, especially when his nail falls off to ensure that he has plan for putting something in place for proper nail regrowth.  In the meantime I recommend keeping the normal nail in place, wearing something protective to help prevent further injury.  We will help control his pain in the emergency department today.  Encouraged ibuprofen, Tylenol, and keeping the wound clean and dry.  Patient discharged in stable condition at this time, return precautions given. Final Clinical Impression(s) / ED Diagnoses Final diagnoses:  Avulsion of toenail, initial encounter    Rx / DC Orders ED Discharge Orders     None         Dorien Chihuahua 08/23/21 2125    Blanchie Dessert, MD 08/23/21 [phone]

## 2021-08-23 NOTE — Discharge Instructions (Addendum)
Please use Tylenol or ibuprofen for pain.  You may use 600 mg ibuprofen every 6 hours or 1000 mg of Tylenol every 6 hours.  You may choose to alternate between the 2.  This would be most effective.  Not to exceed 4 g of Tylenol within 24 hours.  Not to exceed 3200 mg ibuprofen 24 hours.  I would wear a hard shoe, and wrap the toe to prevent you from hitting the toenail on anything else.  I recommend following up with podiatry for further evaluation, continued healing, and if you are having any signs of worsening redness, infection, or you are concerned that pain is not improving despite time, medications as above.

## 2021-09-20 ENCOUNTER — Other Ambulatory Visit: Payer: Self-pay | Admitting: Family Medicine

## 2021-09-20 DIAGNOSIS — M19012 Primary osteoarthritis, left shoulder: Secondary | ICD-10-CM

## 2021-09-25 ENCOUNTER — Emergency Department (HOSPITAL_COMMUNITY)
Admission: EM | Admit: 2021-09-25 | Discharge: 2021-09-25 | Disposition: A | Discharge status: Home / Self Care | Payer: Medicare Other | Attending: Emergency Medicine | Admitting: Emergency Medicine

## 2021-09-25 ENCOUNTER — Other Ambulatory Visit: Payer: Self-pay

## 2021-09-25 ENCOUNTER — Emergency Department (HOSPITAL_COMMUNITY): Payer: Medicare Other

## 2021-09-25 ENCOUNTER — Encounter (HOSPITAL_COMMUNITY): Payer: Self-pay

## 2021-09-25 DIAGNOSIS — J069 Acute upper respiratory infection, unspecified: Secondary | ICD-10-CM | POA: Diagnosis not present

## 2021-09-25 DIAGNOSIS — R059 Cough, unspecified: Secondary | ICD-10-CM | POA: Diagnosis present

## 2021-09-25 DIAGNOSIS — R0602 Shortness of breath: Secondary | ICD-10-CM | POA: Insufficient documentation

## 2021-09-25 DIAGNOSIS — Z20822 Contact with and (suspected) exposure to covid-19: Secondary | ICD-10-CM | POA: Diagnosis not present

## 2021-09-25 DIAGNOSIS — M791 Myalgia, unspecified site: Secondary | ICD-10-CM | POA: Diagnosis not present

## 2021-09-25 LAB — RESP PANEL BY RT-PCR (FLU A&B, COVID) ARPGX2
Influenza A by PCR: NEGATIVE
Influenza B by PCR: NEGATIVE
SARS Coronavirus 2 by RT PCR: NEGATIVE

## 2021-09-25 LAB — BASIC METABOLIC PANEL
Anion gap: 7 (ref 5–15)
BUN: 10 mg/dL (ref 6–20)
CO2: 23 mmol/L (ref 22–32)
Calcium: 8.9 mg/dL (ref 8.9–10.3)
Chloride: 110 mmol/L (ref 98–111)
Creatinine, Ser: 0.59 mg/dL — ABNORMAL LOW (ref 0.61–1.24)
GFR, Estimated: >60 mL/min (ref >60)
Glucose, Bld: 97 mg/dL (ref 70–99)
Potassium: 3.6 mmol/L (ref 3.5–5.1)
Sodium: 140 mmol/L (ref 135–145)

## 2021-09-25 LAB — BRAIN NATRIURETIC PEPTIDE: B Natriuretic Peptide: 9.5 pg/mL (ref 0.0–100.0)

## 2021-09-25 MED ORDER — PREDNISONE 20 MG PO TABS
40.0000 mg | ORAL_TABLET | Freq: Once | ORAL | Status: AC
  Administered 2021-09-25: 40 mg via ORAL
  Filled 2021-09-25: qty 2

## 2021-09-25 MED ORDER — AZITHROMYCIN 250 MG PO TABS: 250.0000 mg | ORAL_TABLET | Freq: Every day | ORAL | 0 refills | Status: DC

## 2021-09-25 MED ORDER — ALBUTEROL SULFATE HFA 108 (90 BASE) MCG/ACT IN AERS
2.0000 | INHALATION_SPRAY | Freq: Four times a day (QID) | RESPIRATORY_TRACT | Status: DC
  Administered 2021-09-25: 2 via RESPIRATORY_TRACT
  Filled 2021-09-25: qty 6.7

## 2021-09-25 MED ORDER — IPRATROPIUM-ALBUTEROL 0.5-2.5 (3) MG/3ML IN SOLN
3.0000 mL | Freq: Once | RESPIRATORY_TRACT | Status: AC
Start: 2021-09-25 — End: 2021-09-25
  Administered 2021-09-25: 3 mL via RESPIRATORY_TRACT
  Filled 2021-09-25: qty 3

## 2021-09-25 MED ORDER — AZITHROMYCIN 250 MG PO TABS
500.0000 mg | ORAL_TABLET | Freq: Once | ORAL | Status: AC
  Administered 2021-09-25: 500 mg via ORAL
  Filled 2021-09-25: qty 2

## 2021-09-25 MED ORDER — PREDNISONE 20 MG PO TABS: 40.0000 mg | ORAL_TABLET | Freq: Every day | ORAL | 0 refills | Status: DC

## 2021-09-25 MED ORDER — ALBUTEROL SULFATE HFA 108 (90 BASE) MCG/ACT IN AERS: 2.0000 | INHALATION_SPRAY | RESPIRATORY_TRACT | 0 refills | Status: AC | PRN

## 2021-09-25 MED ORDER — IPRATROPIUM-ALBUTEROL 0.5-2.5 (3) MG/3ML IN SOLN
3.0000 mL | Freq: Once | RESPIRATORY_TRACT | Status: AC
  Administered 2021-09-25: 3 mL via RESPIRATORY_TRACT
  Filled 2021-09-25: qty 3

## 2021-09-25 NOTE — ED Provider Triage Note (Signed)
Emergency Medicine Provider Triage Evaluation Note  Dominik Yordy , a 56 y.o. male  was evaluated in triage.  Pt complains of history of COPD.  Woke up yesterday with increasing cough with yellow sputum production, shortness of breath, chest tightness.  Has had nasal congestion and rhinorrhea during that time.  Patient is deaf, needs sign language interpreter  Review of Systems  Positive: As above Negative: Fevers, chills  Physical Exam  BP 122/87   Pulse 80   Temp 98.4 F (36.9 C) (Oral)   Resp 16   SpO2 96%  Gen:   Awake, no distress   Resp:  Normal effort, expiratory wheezing MSK:   Moves extremities without difficulty  Other:    Medical Decision Making  Medically screening exam initiated at 9:36 PM.  Appropriate orders placed.  Kaidyn Javid was informed that the remainder of the evaluation will be completed by another provider, this initial triage assessment does not replace that evaluation, and the importance of remaining in the ED until their evaluation is complete.  ED shortness of breath work-up   Roylene Reason, Hershal Coria 09/25/21 2137

## 2021-09-25 NOTE — ED Provider Notes (Signed)
[facility] Emergency Department Provider Note MRN:  [id number]  Arrival date & time: 09/25/21     Chief Complaint   Cough, Nasal Congestion, Headache, and Generalized Body Aches   History of Present Illness   Alan Mendoza is a 56 y.o. year-old male presents to the ED with chief complaint of cough, congestions, rhinorrhea.  Had worsening SOB, cough, and congestion tonight.  Had to leave work and called EMS to come to the hospital.  He denies fever.  He tried taking some aleve without relief.  History provided by patient. Professional ASL interpreter used through [employer].  Review of Systems  Pertinent positive and negative review of systems noted in HPI.    Physical Exam   Vitals:   09/25/21 1946 09/25/21 2202  BP: 122/87 (!) 147/91  Pulse: 80 79  Resp: 16 17  Temp: 98.4 F (36.9 C)   SpO2: 96% 100%    CONSTITUTIONAL:  non toxic-appearing, NAD NEURO:  Alert and oriented x 3, CN 3-12 grossly intact EYES:  eyes equal and reactive ENT/NECK:  Supple, no stridor  CARDIO:  normal rate, regular rhythm, appears well-perfused  PULM:  No respiratory distress, CTAB GI/GU:  non-distended,  MSK/SPINE:  No gross deformities, no edema, moves all extremities  SKIN:  no rash, atraumatic   *Additional and/or pertinent findings included in MDM below  Diagnostic and Interventional Summary     Labs Reviewed  BASIC METABOLIC PANEL - Abnormal; Notable for the following components:      Result Value   Creatinine, Ser 0.59 (*)    All other components within normal limits  RESP PANEL BY RT-PCR (FLU A&B, COVID) [id number]  BRAIN NATRIURETIC PEPTIDE    DG Chest Port 1 View  Final Result      Medications  albuterol (VENTOLIN HFA) 108 (90 Base) MCG/ACT inhaler 2 puff (2 puffs Inhalation Given 09/25/21 2159)  ipratropium-albuterol (DUONEB) 0.5-2.5 (3) MG/3ML nebulizer solution 3 mL (3 mLs Nebulization Given 09/25/21 2159)  ipratropium-albuterol (DUONEB)  0.5-2.5 (3) MG/3ML nebulizer solution 3 mL (3 mLs Nebulization Given 09/25/21 2247)  predniSONE (DELTASONE) tablet 40 mg (40 mg Oral Given 09/25/21 2245)  azithromycin (ZITHROMAX) tablet 500 mg (500 mg Oral Given 09/25/21 2246)     Procedures  /  Critical Care Procedures  ED Course and Medical Decision Making  I have reviewed the triage vital signs, the nursing notes, and pertinent available records from the EMR.  Social Determinants Affecting Complexity of Care: Patient has no clinically significant social determinants affecting this chief complaint..   ED Course:    Medical Decision Making Patient here with cough and congestion.  Hx of COPD.  Afebrile.  CXR negative.  Lung sounds are clear after neb.  COVID negative.  Well appearing.  Plan for outpatient treatment.   Problems Addressed: Upper respiratory tract infection, unspecified type: acute illness or injury  Amount and/or Complexity of Data Reviewed Labs: ordered.    Details: Covid negative Radiology: ordered and independent interpretation performed.    Details: No obvious infiltrate  Risk Prescription drug management.     Consultants: No consultations were needed in caring for this patient.   Treatment and Plan: Emergency department workup does not suggest an emergent condition requiring admission or immediate intervention beyond  what has been performed at this time. The patient is safe for discharge and has  been instructed to return immediately for worsening symptoms, change in  symptoms or any other concerns    Final Clinical Impressions(s) /  ED Diagnoses     ICD-10-CM   1. Upper respiratory tract infection, unspecified type  J06.9       ED Discharge Orders          Ordered    azithromycin (ZITHROMAX) 250 MG tablet  Daily        09/25/21 2301    predniSONE (DELTASONE) 20 MG tablet  Daily        09/25/21 2301    albuterol (VENTOLIN HFA) 108 (90 Base) MCG/ACT inhaler  Every 4 hours PRN        09/25/21  2301              Discharge Instructions Discussed with and Provided to Patient:   Discharge Instructions   None      Montine Circle, PA-C 09/25/21 2308    Quintella Reichert, MD 09/26/21 [phone]

## 2021-09-25 NOTE — ED Triage Notes (Addendum)
Pt BIB EMS and states that he has been having a runny nose, headache, chills, and cough since yesterday. Pt is deaf.

## 2021-09-27 ENCOUNTER — Other Ambulatory Visit: Payer: Self-pay | Admitting: Family Medicine

## 2021-09-27 DIAGNOSIS — E1169 Type 2 diabetes mellitus with other specified complication: Secondary | ICD-10-CM

## 2021-10-11 ENCOUNTER — Other Ambulatory Visit: Payer: Medicare Other

## 2021-10-23 ENCOUNTER — Ambulatory Visit
Admission: RE | Admit: 2021-10-23 | Discharge: 2021-10-23 | Disposition: A | Discharge status: Home / Self Care | Payer: Medicare Other | Source: Ambulatory Visit | Attending: Family Medicine | Admitting: Family Medicine

## 2021-10-23 DIAGNOSIS — M19012 Primary osteoarthritis, left shoulder: Secondary | ICD-10-CM

## 2022-02-19 NOTE — Progress Notes (Unsigned)
Cardiology Office Note:   Date:  02/21/2022  NAME:  Alan Mendoza    MRN: [id number] DOB:  02/04/1965   PCP:  Roselee Nova, MD  Cardiologist:  None  Electrophysiologist:  None   Referring MD: Netta Cedars, MD   Chief Complaint  Patient presents with   Coronary Artery Disease    History of Present Illness:   Alan Mendoza is a 57 y.o. male with a hx of CAD, COPD, tobacco abuse, DM who is being seen today for the evaluation of CAD at the request of Netta Cedars, MD. he reports he will have shoulder surgery.  He needs preoperative assessment.  He reports he had a heart stent in 2014.  Reports he had a blockage.  No blockages since that time.  He is EKG shows normal sinus rhythm with no acute ischemic changes or evidence of infarction.  He reports no chest pain.  No trouble breathing.  He is doing quite well since his heart procedure in 2014.  He has no symptoms of congestive heart failure.  He is diabetic but not on insulin.  Most recent A1c 6.8.  Cholesterol could be lower.  We discussed increasing his Lipitor.  He is not on an aspirin.  We recommended this.  He is smoking 1/2 pack/day.  Has smoked for 30 years.  We discussed quitting.  He is single.  He has 3 children.  He can count a flight of stairs without limitations.  No major symptoms reported in office today.  Problem List DM -A1c 6.8 COPD HLD -T chol 151, HDL 51, LDL 82, TG 90 4. 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    bilateral some slight hearing in left   Diabetes mellitus without complication ([place])    not on meds cks blood sugar weekly   Emphysema lung ([place]) 03/2014   on CT of chest   HLD (hyperlipidemia)    Myocardial infarction [facility])    2014   Pulmonary nodule 03/2014   on CT of chest   Stroke [facility])    denies   Tobacco abuse     Past Surgical History: Past Surgical History:   Procedure Laterality Date   CARDIAC CATHETERIZATION  11/14   stent placement   EXAM UNDER ANESTHESIA WITH MANIPULATION OF KNEE Left 04/21/2014   Procedure: EXAM UNDER ANESTHESIA WITH MANIPULATION OF KNEE;  Surgeon: Meredith Pel, MD;  Location: [facility];  Service: Orthopedics;  Laterality: Left;  LEFT KNEE MANIPULATION UNDER ANESTHESIA   KNEE ARTHROSCOPY Left 1984   NECK SURGERY  10/14   TOTAL KNEE ARTHROPLASTY Left 03/17/2014   Procedure: TOTAL KNEE ARTHROPLASTY;  Surgeon: Meredith Pel, MD;  Location: [facility];  Service: Orthopedics;  Laterality: Left;    Current Medications: Current Meds  Medication Sig   albuterol (VENTOLIN HFA) 108 (90 Base) MCG/ACT inhaler Inhale 2 puffs into the lungs every 4 (four) hours as needed for wheezing or shortness of breath.   atorvastatin (LIPITOR) 40 MG tablet Take 1 tablet (40 mg total) by mouth daily.   azithromycin (ZITHROMAX) 250 MG tablet Take 1 tablet (250 mg total) by mouth daily. Take first 2 tablets together, then 1 every day until finished.   ibuprofen (ADVIL) 800 MG tablet Take 1 tablet (800 mg total) by mouth 3 (three) times daily.   lidocaine (LIDODERM) 5 %  Place 1 patch onto the skin daily. Remove & Discard patch within 12 hours or as directed by MD   metFORMIN (GLUCOPHAGE) 500 MG tablet Take 1 tablet (500 mg total) by mouth 2 (two) times daily with a meal.   predniSONE (DELTASONE) 20 MG tablet Take 2 tablets (40 mg total) by mouth daily.   TRELEGY ELLIPTA 100-62.5-25 MCG/ACT AEPB Take 1 puff by mouth daily.     Allergies:    Patient has no known allergies.   Social History: Social History   Socioeconomic History   Marital status: Single    Spouse name: Not on file   Number of children: 3   Years of education: Not on file   Highest education level: Not on file  Occupational History   Occupation: Disabled  Tobacco Use   Smoking status: Light Smoker    Packs/day: 0.50    Years: 33.00    Total pack years: 16.50    Types:  Cigarettes   Smokeless tobacco: Never  Vaping Use   Vaping Use: Never used  Substance and Sexual Activity   Alcohol use: Not Currently    Alcohol/week: 0.0 standard drinks of alcohol   Drug use: Not Currently    Comment: no IV drug use   Sexual activity: Not Currently  Other Topics Concern   Not on file  Social History Narrative   Does not work    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       04/12/20   From: [place]   Living: with dad's family   Work: not currently, disability - SSI      Family: OK relationship with family, they are supportive      Enjoys: walking, reading       Exercise: walking a few times a week   Diet: healthy diet      Safety   Seat belts: Yes    Guns: No   Safe in relationships: Yes    Social Determinants of Radio broadcast assistant Strain: Not on file  Food Insecurity: Not on file  Transportation Needs: Not on file  Physical Activity: Not on file  Stress: Not on file  Social Connections: Not on file     Family History: The patient's family history includes Alcohol abuse in his brother; Diabetes in his mother; Kidney disease in an other family member; Osteoarthritis in his mother. There is no history of Colon cancer, Esophageal cancer, Pancreatic cancer, Rectal cancer, or Stomach cancer.  ROS:   All other ROS reviewed and negative. Pertinent positives noted in the HPI.     EKGs/Labs/Other Studies Reviewed:   The following studies were personally reviewed by me today:  EKG:  EKG is ordered today.  The ekg ordered today demonstrates normal sinus rhythm heart rate 76, no acute ischemic changes or evidence of infarction, and was personally reviewed by me.   Recent Labs: 05/20/2021: Hemoglobin 14.1; Platelets 297 09/25/2021: B Natriuretic Peptide 9.5; BUN 10; Creatinine, Ser 0.59; Potassium 3.6; Sodium 140   Recent Lipid Panel    Component Value Date/Time   CHOL 150 06/07/2020 1508   CHOL 193 09/27/2017 1041   TRIG 135.0 06/07/2020 1508    HDL 53.90 06/07/2020 1508   HDL 38 (L) 09/27/2017 1041   CHOLHDL 3 06/07/2020 1508   VLDL 27.0 06/07/2020 1508   LDLCALC 70 06/07/2020 1508   LDLCALC 124 (H) 09/27/2017 1041    Physical Exam:   VS:  BP (!) 142/80 (BP Location: Left Arm, Patient  Position: Sitting, Cuff Size: Normal)   Pulse 76   Ht 5\' 7"  (1.702 m)   Wt 151 lb 12.8 oz (68.9 kg)   SpO2 97%   BMI 23.78 kg/m    Wt Readings from Last 3 Encounters:  02/21/22 151 lb 12.8 oz (68.9 kg)  08/23/21 152 lb (68.9 kg)  01/27/21 164 lb (74.4 kg)    General: Well nourished, well developed, in no acute distress Head: Atraumatic, normal size  Eyes: PEERLA, EOMI  Neck: Supple, no JVD Endocrine: No thryomegaly Cardiac: Normal S1, S2; RRR; no murmurs, rubs, or gallops Lungs: Clear to auscultation bilaterally, no wheezing, rhonchi or rales  Abd: Soft, nontender, no hepatomegaly  Ext: No edema, pulses 2+ Musculoskeletal: No deformities, BUE and BLE strength normal and equal Skin: Warm and dry, no rashes   Neuro: Alert and oriented to person, place, time, and situation, CNII-XII grossly intact, no focal deficits  Psych: Normal mood and affect   ASSESSMENT:   Alan Mendoza is a 57 y.o. male who presents for the following: 1. Preoperative cardiovascular examination   2. Coronary artery disease involving native coronary artery of native heart without angina pectoris   3. Mixed hyperlipidemia   4. Tobacco abuse     PLAN:   1. Preoperative cardiovascular examination -The Revised Cardiac Risk Index = 1, which equates to 0.9 estimated risk of perioperative myocardial infarction, pulmonary edema, ventricular fibrillation, cardiac arrest, or complete heart block.  -EKG is normal.  Prior history of PCI.  Denies symptoms of angina.  Can complete greater than 4 METS without limitations. -No further cardiac testing is recommended prior to surgery.  -Our service is available as needed in the peri-operative period.    2. Coronary artery  disease involving native coronary artery of native heart without angina pectoris 3. Mixed hyperlipidemia -History of PCI in 2014.  No recurrent episodes.  EKG is normal sinus rhythm.  No symptoms of angina.  Would recommend he get back on aspirin.  His CV examination is normal.  Would also recommend LDL less than 55.  Increase Lipitor to 80.  We will send his primary care physician his LDL goal.  He will see [place] yearly.  4. Tobacco abuse -Still smoking.  30+ years.  3 minutes suspect cessation counseling was provided in office today.  Disposition: Return in about 1 year (around 02/22/2023).  Medication Adjustments/Labs and Tests Ordered: Current medicines are reviewed at length with the patient today.  Concerns regarding medicines are outlined above.  No orders of the defined types were placed in this encounter.  No orders of the defined types were placed in this encounter.   There are no Patient Instructions on file for this visit.    Signed, Addison Naegeli. Audie Box, MD, [facility]  [address], [place], [place] [address] [phone]  02/21/2022 1:53 PM

## 2022-02-21 ENCOUNTER — Encounter: Payer: Self-pay | Admitting: Cardiovascular Disease

## 2022-02-21 ENCOUNTER — Ambulatory Visit: Payer: 59 | Attending: Cardiovascular Disease | Admitting: Cardiovascular Disease

## 2022-02-21 VITALS — BP 142/80 | HR 76 | Ht 67.0 in | Wt 151.8 lb

## 2022-02-21 DIAGNOSIS — I2510 Atherosclerotic heart disease of native coronary artery without angina pectoris: Secondary | ICD-10-CM | POA: Diagnosis not present

## 2022-02-21 DIAGNOSIS — Z01810 Encounter for preprocedural cardiovascular examination: Secondary | ICD-10-CM

## 2022-02-21 DIAGNOSIS — F17210 Nicotine dependence, cigarettes, uncomplicated: Secondary | ICD-10-CM | POA: Diagnosis not present

## 2022-02-21 DIAGNOSIS — E782 Mixed hyperlipidemia: Secondary | ICD-10-CM | POA: Diagnosis not present

## 2022-02-21 DIAGNOSIS — Z720 Tobacco use: Secondary | ICD-10-CM

## 2022-02-21 MED ORDER — ASPIRIN 81 MG PO TBEC: 81.0000 mg | DELAYED_RELEASE_TABLET | Freq: Every day | ORAL | 3 refills | Status: DC

## 2022-02-21 MED ORDER — ATORVASTATIN CALCIUM 80 MG PO TABS: 80.0000 mg | ORAL_TABLET | Freq: Every day | ORAL | 3 refills | Status: AC

## 2022-02-21 NOTE — Patient Instructions (Addendum)
Medication Instructions:  START Aspirin 81 mg daily  INCREASE Lipitor to 80 mg daily   *If you need a refill on your cardiac medications before your next appointment, please call your pharmacy*  Follow-Up: At [facility], you and your health needs are our priority.  As part of our continuing mission to provide you with exceptional heart care, we have created designated Provider Care Teams.  These Care Teams include your primary Cardiologist (physician) and Advanced Practice Providers (APPs -  Physician Assistants and Nurse Practitioners) who all work together to provide you with the care you need, when you need it.  We recommend signing up for the patient portal called "MyChart".  Sign up information is provided on this After Visit Summary.  MyChart is used to connect with patients for Virtual Visits (Telemedicine).  Patients are able to view lab/test results, encounter notes, upcoming appointments, etc.  Non-urgent messages can be sent to your provider as well.   To learn more about what you can do with MyChart, go to [email or web address].    Your next appointment:   12 month(s)  Provider:   Eleonore Chiquito, MD or Sande Rives, PA-C, or Almyra Deforest, [place]

## 2023-01-08 ENCOUNTER — Other Ambulatory Visit: Payer: Self-pay | Admitting: Cardiovascular Disease

## 2023-08-17 ENCOUNTER — Ambulatory Visit: Admitting: Internal Medicine

## 2023-08-17 NOTE — Progress Notes (Deleted)
 Name: Alan Mendoza  MRN/ DOB: [id number], 1965/06/21   Age/ Sex: 58 y.o., male    PCP: Maree Leni Edyth DELENA, MD   Reason for Endocrinology Evaluation: Type 2 Diabetes Mellitus     Date of Initial Endocrinology Visit: 08/17/2023     PATIENT IDENTIFIER: Alan Mendoza is a 58 y.o. male with a past medical history of DM, COPD, CAD, Hx of cocaine abuse.The patient presented for initial endocrinology clinic visit on 08/17/2023 for consultative assistance with his diabetes management.    HPI: Alan Mendoza was    Diagnosed with DM 2018 Prior Medications tried/Intolerance: *** Currently checking blood sugars *** x / day,  before breakfast and ***.  Hypoglycemia episodes : ***               Symptoms: ***                 Frequency: ***/  Hemoglobin A1c has ranged from 6.2% in 2019, peaking at 7.0% in 2025.   In terms of diet, the patient ***   HOME DIABETES REGIMEN: Metformin 500 mg twice daily  Statin: Yes ACE-I/ARB: {YES/NO:17245} Prior Diabetic Education: {Yes/No:11203}   METER DOWNLOAD SUMMARY: Date range evaluated: *** Fingerstick Blood Glucose Tests = *** Average Number Tests/Day = *** Overall Mean FS Glucose = *** Standard Deviation = ***  BG Ranges: Low = *** High = ***   Hypoglycemic Events/30 Days: BG < 50 = *** Episodes of symptomatic severe hypoglycemia = ***   DIABETIC COMPLICATIONS: Microvascular complications:  *** Denies: *** Last eye exam: Completed   Macrovascular complications:  CAD(s/p PCI in 2014), PVD Denies:   CVA   PAST HISTORY: Past Medical History:  Past Medical History:  Diagnosis Date   Arthritis    Coronary artery disease    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    bilateral some slight hearing in left   Diabetes mellitus without complication ([facility])    not on meds cks blood sugar weekly   Emphysema lung ([facility]) 03/2014   on CT of chest   HLD (hyperlipidemia)    Myocardial infarction [facility])    2014   Pulmonary nodule 03/2014   on  CT of chest   Stroke [facility])    denies   Tobacco abuse    Past Surgical History:  Past Surgical History:  Procedure Laterality Date   CARDIAC CATHETERIZATION  11/14   stent placement   EXAM UNDER ANESTHESIA WITH MANIPULATION OF KNEE Left 04/21/2014   Procedure: EXAM UNDER ANESTHESIA WITH MANIPULATION OF KNEE;  Surgeon: Glendia Cordella Hutchinson, MD;  [facility];  Service: Orthopedics;  Laterality: Left;  LEFT KNEE MANIPULATION UNDER ANESTHESIA   KNEE ARTHROSCOPY Left 1984   NECK SURGERY  10/14   TOTAL KNEE ARTHROPLASTY Left 03/17/2014   Procedure: TOTAL KNEE ARTHROPLASTY;  Surgeon: Glendia Cordella Hutchinson, MD;  Location: [facility];  Service: Orthopedics;  Laterality: Left;    Social History:  reports that he has been smoking cigarettes. He has a 16.5 pack-year smoking history. He has never used smokeless tobacco. He reports that he does not currently use alcohol. He reports that he does not currently use drugs. Family History:  Family History  Problem Relation Age of Onset   Diabetes Mother    Osteoarthritis Mother    Alcohol abuse Brother    Kidney disease Other    Colon cancer Neg Hx    Esophageal cancer Neg Hx    Pancreatic cancer Neg Hx  Rectal cancer Neg Hx    Stomach cancer Neg Hx      HOME MEDICATIONS: Allergies as of 08/17/2023   No Known Allergies      Medication List        Accurate as of August 17, 2023  6:59 AM. If you have any questions, ask your nurse or doctor.          albuterol 108 (90 Base) MCG/ACT inhaler Commonly known as: VENTOLIN HFA Inhale 2 puffs into the lungs every 4 (four) hours as needed for wheezing or shortness of breath.   aspirin EC 81 MG tablet Take 1 tablet (81 mg total) by mouth daily. Swallow whole.   atorvastatin 80 MG tablet Commonly known as: LIPITOR Take 1 tablet (80 mg total) by mouth daily.   azithromycin 250 MG tablet Commonly known as: ZITHROMAX Take 1 tablet (250 mg total) by mouth daily. Take first 2 tablets together, then  1 every day until finished.   ibuprofen 800 MG tablet Commonly known as: ADVIL Take 1 tablet (800 mg total) by mouth 3 (three) times daily.   lidocaine 5 % Commonly known as: Lidoderm Place 1 patch onto the skin daily. Remove & Discard patch within 12 hours or as directed by MD   metFORMIN 500 MG tablet Commonly known as: GLUCOPHAGE Take 1 tablet (500 mg total) by mouth 2 (two) times daily with a meal.   predniSONE 20 MG tablet Commonly known as: DELTASONE Take 2 tablets (40 mg total) by mouth daily.   Trelegy Ellipta 100-62.5-25 MCG/ACT Aepb Generic drug: Fluticasone-Umeclidin-Vilant Take 1 puff by mouth daily.         ALLERGIES: No Known Allergies   REVIEW OF SYSTEMS: A comprehensive ROS was conducted with the patient and is negative except as per HPI     OBJECTIVE:   VITAL SIGNS: There were no vitals taken for this visit.   PHYSICAL EXAM:  General: Pt appears well and is in NAD  Neck: General: Supple without adenopathy or carotid bruits. Thyroid: Thyroid size normal.  No goiter or nodules appreciated.   Lungs: Clear with good BS bilat   Heart: RRR   Abdomen:  soft, nontender  Extremities:  Lower extremities - No pretibial edema.   Neuro: MS is good with appropriate affect, pt is alert and Ox3    DM foot exam:    DATA REVIEWED:  Lab Results  Component Value Date   HGBA1C 6.8 (H) 06/07/2020   HGBA1C 6.2 (H) 09/27/2017   HGBA1C 6.5 06/14/2016    Labs through PCPs office  11/01/2022 A1c 7.0% LDL 93 Triglycerides 119 HDL 43  ASSESSMENT / PLAN / RECOMMENDATIONS:   1) Type 2 Diabetes Mellitus, ***controlled, With macrovascular complications - Most recent A1c of *** %. Goal A1c < 7.0 %.    Plan: GENERAL: ***  MEDICATIONS: ***  EDUCATION / INSTRUCTIONS: BG monitoring instructions: Patient is instructed to check his blood sugars *** times a day, ***. Call [facility] if: BG persistently < 70  I reviewed the Rule of 15 for  the treatment of hypoglycemia in detail with the patient. Literature supplied.   2) Diabetic complications:  Eye: Does *** have known diabetic retinopathy.  Neuro/ Feet: Does *** have known diabetic peripheral neuropathy. Renal: Patient does *** have known baseline CKD. He is *** on an ACEI/ARB at present.  3) Dyslipidemia/CAD:  - Per cardiology     Signed electronically by: Stefano Redgie Butts, MD  [facility] Endocrinology  [facility] [address] [place], [place] [address] Phone: [phone] FAX: [phone]   CC: Maree Leni Edyth DELENA, MD [address] [place] [address] Phone: [phone]  Fax: [phone]    Return to Endocrinology clinic as below: Future Appointments  Date Time Provider Department Center  08/17/2023  8:30 AM Niara Bunker, Donell Cardinal, MD [facility] None

## 2023-09-27 ENCOUNTER — Encounter (HOSPITAL_COMMUNITY): Payer: Self-pay

## 2023-09-27 ENCOUNTER — Inpatient Hospital Stay (HOSPITAL_COMMUNITY)
Admission: EM | Admit: 2023-09-27 | Discharge: 2023-09-30 | DRG: 192 | Disposition: A | Discharge status: Home / Self Care | Attending: Family Medicine | Admitting: Family Medicine

## 2023-09-27 ENCOUNTER — Emergency Department (HOSPITAL_COMMUNITY)

## 2023-09-27 ENCOUNTER — Other Ambulatory Visit: Payer: Self-pay

## 2023-09-27 DIAGNOSIS — K5904 Chronic idiopathic constipation: Secondary | ICD-10-CM | POA: Diagnosis present

## 2023-09-27 DIAGNOSIS — Z79899 Other long term (current) drug therapy: Secondary | ICD-10-CM

## 2023-09-27 DIAGNOSIS — T50996A Underdosing of other drugs, medicaments and biological substances, initial encounter: Secondary | ICD-10-CM | POA: Diagnosis present

## 2023-09-27 DIAGNOSIS — J441 Chronic obstructive pulmonary disease with (acute) exacerbation: Principal | ICD-10-CM | POA: Diagnosis present

## 2023-09-27 DIAGNOSIS — Z23 Encounter for immunization: Secondary | ICD-10-CM

## 2023-09-27 DIAGNOSIS — Z66 Do not resuscitate: Secondary | ICD-10-CM | POA: Diagnosis present

## 2023-09-27 DIAGNOSIS — E1169 Type 2 diabetes mellitus with other specified complication: Secondary | ICD-10-CM | POA: Diagnosis present

## 2023-09-27 DIAGNOSIS — Z955 Presence of coronary angioplasty implant and graft: Secondary | ICD-10-CM

## 2023-09-27 DIAGNOSIS — R000 Tachycardia, unspecified: Secondary | ICD-10-CM | POA: Diagnosis present

## 2023-09-27 DIAGNOSIS — H905 Unspecified sensorineural hearing loss: Secondary | ICD-10-CM | POA: Diagnosis present

## 2023-09-27 DIAGNOSIS — R519 Headache, unspecified: Secondary | ICD-10-CM | POA: Diagnosis present

## 2023-09-27 DIAGNOSIS — F17200 Nicotine dependence, unspecified, uncomplicated: Secondary | ICD-10-CM | POA: Diagnosis present

## 2023-09-27 DIAGNOSIS — F329 Major depressive disorder, single episode, unspecified: Secondary | ICD-10-CM | POA: Diagnosis present

## 2023-09-27 DIAGNOSIS — R109 Unspecified abdominal pain: Secondary | ICD-10-CM | POA: Diagnosis present

## 2023-09-27 DIAGNOSIS — Z7951 Long term (current) use of inhaled steroids: Secondary | ICD-10-CM

## 2023-09-27 DIAGNOSIS — Z789 Other specified health status: Secondary | ICD-10-CM

## 2023-09-27 DIAGNOSIS — I2510 Atherosclerotic heart disease of native coronary artery without angina pectoris: Secondary | ICD-10-CM | POA: Diagnosis present

## 2023-09-27 DIAGNOSIS — K5909 Other constipation: Secondary | ICD-10-CM | POA: Insufficient documentation

## 2023-09-27 DIAGNOSIS — Z7984 Long term (current) use of oral hypoglycemic drugs: Secondary | ICD-10-CM

## 2023-09-27 LAB — URINALYSIS, ROUTINE W REFLEX MICROSCOPIC
Bilirubin Urine: NEGATIVE
Glucose, UA: NEGATIVE mg/dL
Ketones, ur: 5 mg/dL — AB
Leukocytes,Ua: NEGATIVE
Nitrite: NEGATIVE
Protein, ur: NEGATIVE mg/dL
Specific Gravity, Urine: >1.046 — ABNORMAL HIGH (ref 1.005–1.030)
pH: 5 (ref 5.0–8.0)

## 2023-09-27 LAB — COMPREHENSIVE METABOLIC PANEL WITH GFR
ALT: 32 U/L (ref 0–44)
AST: 35 U/L (ref 15–41)
Albumin: 3.9 g/dL (ref 3.5–5.0)
Alkaline Phosphatase: 95 U/L (ref 38–126)
Anion gap: 13 (ref 5–15)
BUN: 14 mg/dL (ref 6–20)
CO2: 21 mmol/L — ABNORMAL LOW (ref 22–32)
Calcium: 9 mg/dL (ref 8.9–10.3)
Chloride: 104 mmol/L (ref 98–111)
Creatinine, Ser: 0.87 mg/dL (ref 0.61–1.24)
GFR, Estimated: >60 mL/min (ref >60)
Glucose, Bld: 120 mg/dL — ABNORMAL HIGH (ref 70–99)
Potassium: 3.5 mmol/L (ref 3.5–5.1)
Sodium: 138 mmol/L (ref 135–145)
Total Bilirubin: 1 mg/dL (ref 0.0–1.2)
Total Protein: 7.7 g/dL (ref 6.5–8.1)

## 2023-09-27 LAB — I-STAT CHEM 8, ED
BUN: 14 mg/dL (ref 6–20)
Calcium, Ion: 1.14 mmol/L — ABNORMAL LOW (ref 1.15–1.40)
Chloride: 103 mmol/L (ref 98–111)
Creatinine, Ser: 0.8 mg/dL (ref 0.61–1.24)
Glucose, Bld: 119 mg/dL — ABNORMAL HIGH (ref 70–99)
HCT: 45 % (ref 39.0–52.0)
Hemoglobin: 15.3 g/dL (ref 13.0–17.0)
Potassium: 3.5 mmol/L (ref 3.5–5.1)
Sodium: 141 mmol/L (ref 135–145)
TCO2: 23 mmol/L (ref 22–32)

## 2023-09-27 LAB — CBC WITH DIFFERENTIAL/PLATELET
Abs Immature Granulocytes: 0.02 K/uL (ref 0.00–0.07)
Basophils Absolute: 0 K/uL (ref 0.0–0.1)
Basophils Relative: 0 %
Eosinophils Absolute: 0.1 K/uL (ref 0.0–0.5)
Eosinophils Relative: 2 %
HCT: 44.7 % (ref 39.0–52.0)
Hemoglobin: 14 g/dL (ref 13.0–17.0)
Immature Granulocytes: 0 %
Lymphocytes Relative: 18 %
Lymphs Abs: 1.1 K/uL (ref 0.7–4.0)
MCH: 27.3 pg (ref 26.0–34.0)
MCHC: 31.3 g/dL (ref 30.0–36.0)
MCV: 87.1 fL (ref 80.0–100.0)
Monocytes Absolute: 0.7 K/uL (ref 0.1–1.0)
Monocytes Relative: 12 %
Neutro Abs: 4.1 K/uL (ref 1.7–7.7)
Neutrophils Relative %: 68 %
Platelets: 248 K/uL (ref 150–400)
RBC: 5.13 MIL/uL (ref 4.22–5.81)
RDW: 12.5 % (ref 11.5–15.5)
WBC: 6 K/uL (ref 4.0–10.5)
nRBC: 0 % (ref 0.0–0.2)

## 2023-09-27 LAB — LIPASE, BLOOD: Lipase: 14 U/L (ref 11–51)

## 2023-09-27 LAB — RESP PANEL BY RT-PCR (RSV, FLU A&B, COVID)  RVPGX2
Influenza A by PCR: NEGATIVE
Influenza B by PCR: NEGATIVE
Resp Syncytial Virus by PCR: NEGATIVE
SARS Coronavirus 2 by RT PCR: NEGATIVE

## 2023-09-27 LAB — HIV ANTIBODY (ROUTINE TESTING W REFLEX): HIV Screen 4th Generation wRfx: NONREACTIVE

## 2023-09-27 LAB — HEMOGLOBIN A1C
Hgb A1c MFr Bld: 6.4 % — ABNORMAL HIGH (ref 4.8–5.6)
Mean Plasma Glucose: 136.98 mg/dL

## 2023-09-27 LAB — GLUCOSE, CAPILLARY
Glucose-Capillary: 144 mg/dL — ABNORMAL HIGH (ref 70–99)
Glucose-Capillary: 161 mg/dL — ABNORMAL HIGH (ref 70–99)

## 2023-09-27 LAB — TROPONIN I (HIGH SENSITIVITY)
Troponin I (High Sensitivity): 7 ng/L (ref <18)
Troponin I (High Sensitivity): 7 ng/L (ref <18)

## 2023-09-27 MED ORDER — INFLUENZA VIRUS VACC SPLIT PF (FLUZONE) 0.5 ML IM SUSY
0.5000 mL | PREFILLED_SYRINGE | INTRAMUSCULAR | Status: AC
  Administered 2023-09-28: 0.5 mL via INTRAMUSCULAR
  Filled 2023-09-27: qty 0.5

## 2023-09-27 MED ORDER — GUAIFENESIN-DM 100-10 MG/5ML PO SYRP
5.0000 mL | ORAL_SOLUTION | ORAL | Status: DC | PRN
  Filled 2023-09-27: qty 5

## 2023-09-27 MED ORDER — PHENOL 1.4 % MT LIQD
1.0000 | OROMUCOSAL | Status: DC | PRN
  Filled 2023-09-27: qty 177

## 2023-09-27 MED ORDER — GUAIFENESIN-DM 100-10 MG/5ML PO SYRP: 5.0000 mL | ORAL_SOLUTION | ORAL | Status: DC | PRN

## 2023-09-27 MED ORDER — FLUTICASONE FUROATE-VILANTEROL 200-25 MCG/ACT IN AEPB
2.0000 | INHALATION_SPRAY | Freq: Two times a day (BID) | RESPIRATORY_TRACT | Status: DC
Start: 2023-09-27 — End: 2023-09-28
  Administered 2023-09-28: 2 via RESPIRATORY_TRACT
  Filled 2023-09-27: qty 28

## 2023-09-27 MED ORDER — SODIUM CHLORIDE 0.9 % IV BOLUS
1000.0000 mL | Freq: Once | INTRAVENOUS | Status: AC
  Administered 2023-09-27: 1000 mL via INTRAVENOUS

## 2023-09-27 MED ORDER — CARMEX CLASSIC LIP BALM EX OINT: 1.0000 | TOPICAL_OINTMENT | CUTANEOUS | Status: DC | PRN

## 2023-09-27 MED ORDER — METHYLPREDNISOLONE SODIUM SUCC 125 MG IJ SOLR
70.0000 mg | Freq: Once | INTRAMUSCULAR | Status: AC
  Administered 2023-09-27: 70 mg via INTRAVENOUS
  Filled 2023-09-27: qty 2

## 2023-09-27 MED ORDER — MORPHINE SULFATE (PF) 4 MG/ML IV SOLN
4.0000 mg | Freq: Once | INTRAVENOUS | Status: AC
  Administered 2023-09-27: 4 mg via INTRAVENOUS
  Filled 2023-09-27: qty 1

## 2023-09-27 MED ORDER — ENOXAPARIN SODIUM 40 MG/0.4ML IJ SOSY
40.0000 mg | PREFILLED_SYRINGE | INTRAMUSCULAR | Status: DC
  Administered 2023-09-27 – 2023-09-29 (×3): 40 mg via SUBCUTANEOUS
  Filled 2023-09-27 (×3): qty 0.4

## 2023-09-27 MED ORDER — IPRATROPIUM-ALBUTEROL 0.5-2.5 (3) MG/3ML IN SOLN
3.0000 mL | Freq: Once | RESPIRATORY_TRACT | Status: AC
  Administered 2023-09-27: 3 mL via RESPIRATORY_TRACT
  Filled 2023-09-27: qty 3

## 2023-09-27 MED ORDER — SENNA 8.6 MG PO TABS
1.0000 | ORAL_TABLET | Freq: Two times a day (BID) | ORAL | Status: DC
Start: 2023-09-27 — End: 2023-09-30
  Administered 2023-09-27 – 2023-09-30 (×6): 8.6 mg via ORAL
  Filled 2023-09-27 (×6): qty 1

## 2023-09-27 MED ORDER — INSULIN ASPART 100 UNIT/ML IJ SOLN
0.0000 [IU] | INTRAMUSCULAR | Status: DC
  Administered 2023-09-27: 1 [IU] via SUBCUTANEOUS
  Administered 2023-09-27: 2 [IU] via SUBCUTANEOUS
  Administered 2023-09-28: 1 [IU] via SUBCUTANEOUS

## 2023-09-27 MED ORDER — PREDNISONE 10 MG PO TABS
40.0000 mg | ORAL_TABLET | Freq: Every day | ORAL | Status: DC
  Administered 2023-09-28: 40 mg via ORAL
  Filled 2023-09-27: qty 4

## 2023-09-27 MED ORDER — SALINE SPRAY 0.65 % NA SOLN: 1.0000 | NASAL | Status: DC | PRN

## 2023-09-27 MED ORDER — ATORVASTATIN CALCIUM 80 MG PO TABS
80.0000 mg | ORAL_TABLET | Freq: Every day | ORAL | Status: DC
Start: 2023-09-27 — End: 2023-09-30
  Administered 2023-09-27 – 2023-09-30 (×4): 80 mg via ORAL
  Filled 2023-09-27 (×4): qty 1

## 2023-09-27 MED ORDER — SODIUM CHLORIDE 0.9 % IV SOLN
1.0000 g | INTRAVENOUS | Status: DC
  Administered 2023-09-27: 1 g via INTRAVENOUS
  Filled 2023-09-27: qty 10

## 2023-09-27 MED ORDER — ENOXAPARIN SODIUM 40 MG/0.4ML IJ SOSY: 40.0000 mg | PREFILLED_SYRINGE | INTRAMUSCULAR | Status: DC

## 2023-09-27 MED ORDER — IPRATROPIUM-ALBUTEROL 0.5-2.5 (3) MG/3ML IN SOLN
3.0000 mL | Freq: Four times a day (QID) | RESPIRATORY_TRACT | Status: DC
Start: 2023-09-27 — End: 2023-09-28
  Administered 2023-09-27 – 2023-09-28 (×3): 3 mL via RESPIRATORY_TRACT
  Filled 2023-09-27 (×3): qty 3

## 2023-09-27 MED ORDER — IPRATROPIUM-ALBUTEROL 0.5-2.5 (3) MG/3ML IN SOLN
3.0000 mL | Freq: Once | RESPIRATORY_TRACT | Status: AC
Start: 2023-09-27 — End: 2023-09-27
  Administered 2023-09-27: 3 mL via RESPIRATORY_TRACT
  Filled 2023-09-27: qty 3

## 2023-09-27 MED ORDER — ALBUTEROL SULFATE (2.5 MG/3ML) 0.083% IN NEBU
2.5000 mg | INHALATION_SOLUTION | RESPIRATORY_TRACT | Status: DC | PRN
  Filled 2023-09-27: qty 3

## 2023-09-27 MED ORDER — POLYETHYLENE GLYCOL 3350 17 G PO PACK
17.0000 g | PACK | Freq: Two times a day (BID) | ORAL | Status: DC
  Administered 2023-09-27 – 2023-09-30 (×6): 17 g via ORAL
  Filled 2023-09-27 (×6): qty 1

## 2023-09-27 MED ORDER — GUAIFENESIN 100 MG/5ML PO LIQD
5.0000 mL | Freq: Once | ORAL | Status: AC
  Administered 2023-09-27: 5 mL via ORAL
  Filled 2023-09-27: qty 10

## 2023-09-27 MED ORDER — ALBUTEROL SULFATE (2.5 MG/3ML) 0.083% IN NEBU
2.5000 mg | INHALATION_SOLUTION | Freq: Once | RESPIRATORY_TRACT | Status: AC
  Administered 2023-09-27: 2.5 mg via RESPIRATORY_TRACT
  Filled 2023-09-27: qty 3

## 2023-09-27 MED ORDER — ACETAMINOPHEN 650 MG RE SUPP: 650.0000 mg | Freq: Four times a day (QID) | RECTAL | Status: DC | PRN

## 2023-09-27 MED ORDER — ACETAMINOPHEN 325 MG PO TABS
650.0000 mg | ORAL_TABLET | Freq: Four times a day (QID) | ORAL | Status: DC | PRN
  Administered 2023-09-28: 650 mg via ORAL
  Filled 2023-09-27: qty 2

## 2023-09-27 MED ORDER — MAGNESIUM HYDROXIDE 400 MG/5ML PO SUSP
15.0000 mL | Freq: Every day | ORAL | Status: DC
  Administered 2023-09-28 – 2023-09-30 (×3): 15 mL via ORAL
  Filled 2023-09-27 (×3): qty 30

## 2023-09-27 MED ORDER — POLYVINYL ALCOHOL 1.4 % OP SOLN: 1.0000 [drp] | OPHTHALMIC | Status: DC | PRN

## 2023-09-27 MED ORDER — BENZONATATE 100 MG PO CAPS
100.0000 mg | ORAL_CAPSULE | Freq: Three times a day (TID) | ORAL | Status: DC | PRN
  Administered 2023-09-28 – 2023-09-29 (×6): 100 mg via ORAL
  Filled 2023-09-27 (×7): qty 1

## 2023-09-27 MED ORDER — ONDANSETRON HCL 4 MG/2ML IJ SOLN
4.0000 mg | Freq: Once | INTRAMUSCULAR | Status: AC | PRN
  Administered 2023-09-27: 4 mg via INTRAVENOUS
  Filled 2023-09-27: qty 2

## 2023-09-27 MED ORDER — GUAIFENESIN-DM 100-10 MG/5ML PO SYRP
5.0000 mL | ORAL_SOLUTION | Freq: Four times a day (QID) | ORAL | Status: DC
  Filled 2023-09-27 (×2): qty 5

## 2023-09-27 MED ORDER — GUAIFENESIN-CODEINE 100-10 MG/5ML PO SOLN
10.0000 mL | Freq: Once | ORAL | Status: AC
  Administered 2023-09-27: 10 mL via ORAL
  Filled 2023-09-27: qty 10

## 2023-09-27 MED ORDER — ALUM & MAG HYDROXIDE-SIMETH 200-200-20 MG/5ML PO SUSP: 30.0000 mL | ORAL | Status: DC | PRN

## 2023-09-27 MED ORDER — IOHEXOL 350 MG/ML SOLN
75.0000 mL | Freq: Once | INTRAVENOUS | Status: AC | PRN
  Administered 2023-09-27: 75 mL via INTRAVENOUS

## 2023-09-27 MED ORDER — PREDNISONE 10 MG PO TABS: 20.0000 mg | ORAL_TABLET | Freq: Every day | ORAL | Status: DC

## 2023-09-27 MED ORDER — MELATONIN 3 MG PO TABS
3.0000 mg | ORAL_TABLET | Freq: Every day | ORAL | Status: DC
  Administered 2023-09-28 – 2023-09-29 (×3): 3 mg via ORAL
  Filled 2023-09-27 (×3): qty 1

## 2023-09-27 NOTE — ED Triage Notes (Signed)
 Complains of sob, cough and pain in his stomach.  Patient has audible wheezing.  Has blood in nostril when blowing his nose.  Reports symptoms started 4 days ago.  Productive sputum that is yellow.  Denies fever Reports sweating a lot.  Denies urinary or bowel symptoms with lower abd pain. Also complains of HA  Reports feels it may be from symbicort .

## 2023-09-27 NOTE — Assessment & Plan Note (Addendum)
 Saturating well on room air, vitals notable for tachycardia and tachypnea.  Labs unremarkable.  in the context of inadequate medication adherence suspect likely secondary to that with possible viral infection.  Low suspicion for pneumonia with clear CXR and afebrile. - Admit to [facility] Dr. Billee, MedSurg floor, vitals per floor - DuoNebs every 6 hours - Start home inhaler 2 puffs 2 times a day, formulary equivalent is Breo Ellipta . - Start ceftriaxone  1 g daily (9/25 - 9/29) - Ordered Flutter valve and incentive spirometry, can consider hypertonic saline given productive cough - Received 1 dose of IV Solu-Medrol  in ED, will transition to prednisone  to complete 5-day course 9/25-9/29 - Continue to monitor respiratory status for need of increased DuoNeb treatment/supplemental oxygen

## 2023-09-27 NOTE — Assessment & Plan Note (Addendum)
 CAD-continue aspirin  and atorvastatin  Tobacco use disorder-interested in receiving resources at discharge Sensorineural hearing loss-use ASL interpreter MDD-previously treated with duloxetine 

## 2023-09-27 NOTE — ED Provider Notes (Signed)
 [facility] Provider Note   CSN: [id number] Arrival date & time: 09/27/23  9148     Patient presents with: Shortness of Breath, Cough, and Abdominal Pain   Alan Mendoza is a 58 y.o. male.   HPI 50 male presents with multiple complaints.  History is taken with the sign language interpreter.  For the past 3 to 4 days patient has been having cough with shortness of breath.  He is also having abdominal pain, primarily left lower abdominal.  He denies any fevers or vomiting.  He has been having some chest pain as well as some shortness of breath.  Has had an on and off headache which is mild to moderate.  No vomiting or diarrhea.  Last BM was 3 days ago.  No urinary symptoms.  Cough is productive of yellow and white sputum.  Prior to Admission medications   Medication Sig Start Date End Date Taking? Authorizing Provider  albuterol  (VENTOLIN  HFA) 108 (90 Base) MCG/ACT inhaler Inhale 2 puffs into the lungs every 4 (four) hours as needed for wheezing or shortness of breath. 09/25/21  Yes Vicky Charleston, PA-C  atorvastatin  (LIPITOR ) 80 MG tablet Take 1 tablet (80 mg total) by mouth daily. 02/21/22  Yes O'Neal, Darryle Ned, MD  metFORMIN  (GLUCOPHAGE ) 500 MG tablet Take 1 tablet (500 mg total) by mouth 2 (two) times daily with a meal. Patient taking differently: Take 500 mg by mouth every evening. 07/08/20  Yes Velma Raisin, MD  SYMBICORT  160-4.5 MCG/ACT inhaler Inhale 2 puffs into the lungs 2 (two) times daily. 08/10/23  Yes [provider]  TRELEGY ELLIPTA 100-62.5-25 MCG/ACT AEPB Take 1 puff by mouth daily. 11/17/20  Yes [provider]    Allergies: Patient has no known allergies.    Review of Systems  Constitutional:  Negative for fever.  Respiratory:  Positive for cough and shortness of breath.   Cardiovascular:  Positive for chest pain.  Gastrointestinal:  Positive for abdominal pain. Negative for vomiting.  Genitourinary:   Negative for dysuria.  Neurological:  Positive for headaches.    Updated Vital Signs BP (!) 116/96   Pulse (!) 108   Temp 98.6 F (37 C) (Oral)   Resp 17   Ht 5' 7 (1.702 m)   Wt 68.5 kg   SpO2 94%   BMI 23.65 kg/m   Physical Exam Vitals and nursing note reviewed.  Constitutional:      General: He is not in acute distress.    Appearance: He is well-developed. He is not ill-appearing or diaphoretic.  HENT:     Head: Normocephalic and atraumatic.  Cardiovascular:     Rate and Rhythm: Regular rhythm. Tachycardia present.     Heart sounds: Normal heart sounds.  Pulmonary:     Effort: Pulmonary effort is normal. Tachypnea present. No accessory muscle usage or respiratory distress.     Breath sounds: Wheezing present.  Abdominal:     Palpations: Abdomen is soft.     Tenderness: There is generalized abdominal tenderness.  Skin:    General: Skin is warm and dry.  Neurological:     Mental Status: He is alert.     (all labs ordered are listed, but only abnormal results are displayed) Labs Reviewed  COMPREHENSIVE METABOLIC PANEL WITH GFR - Abnormal; Notable for the following components:      Result Value   CO2 21 (*)    Glucose, Bld 120 (*)    All other components within  normal limits  URINALYSIS, ROUTINE W REFLEX MICROSCOPIC - Abnormal; Notable for the following components:   Specific Gravity, Urine >1.046 (*)    Hgb urine dipstick SMALL (*)    Ketones, ur 5 (*)    Bacteria, UA RARE (*)    All other components within normal limits  I-STAT CHEM 8, ED - Abnormal; Notable for the following components:   Glucose, Bld 119 (*)    Calcium , Ion 1.14 (*)    All other components within normal limits  RESP PANEL BY RT-PCR (RSV, FLU A&B, COVID)  RVPGX2  LIPASE, BLOOD  CBC WITH DIFFERENTIAL/PLATELET  TROPONIN I (HIGH SENSITIVITY)  TROPONIN I (HIGH SENSITIVITY)    EKG: EKG Interpretation Date/Time:  Thursday September 27 2023 08:52:02 EDT Ventricular Rate:  111 PR  Interval:  152 QRS Duration:  89 QT Interval:  342 QTC Calculation: 465 R Axis:   67  Text Interpretation: Sinus tachycardia Consider left atrial enlargement Borderline ST depression, diffuse leads no significant change since Sept 2023 Confirmed by Freddi Hamilton [id number]) on 09/27/2023 9:10:35 AM  Radiology: CT ABDOMEN PELVIS W CONTRAST Result Date: 09/27/2023 CLINICAL DATA:  LLQ abdominal pain EXAM: CT ABDOMEN AND PELVIS WITH CONTRAST TECHNIQUE: Multidetector CT imaging of the abdomen and pelvis was performed using the standard protocol following bolus administration of intravenous contrast. RADIATION DOSE REDUCTION: This exam was performed according to the departmental dose-optimization program which includes automated exposure control, adjustment of the mA and/or kV according to patient size and/or use of iterative reconstruction technique. CONTRAST:  75mL OMNIPAQUE  IOHEXOL  350 MG/ML SOLN COMPARISON:  None available. FINDINGS: Lower chest: No focal airspace consolidation or pleural effusion. Hepatobiliary: No mass.Decompressed gallbladder without radiopaque stones or wall thickening. No intrahepatic or extrahepatic biliary ductal dilation. The portal veins are patent. Pancreas: No mass or main ductal dilation. No peripancreatic inflammation or fluid collection. Spleen: Normal size. No mass. Adrenals/Urinary Tract: No adrenal masses. No renal mass. No nephrolithiasis or hydronephrosis. Partially distended urinary bladder without visualized abnormality. Stomach/Bowel: The stomach is decompressed without focal abnormality. No small bowel wall thickening or inflammation. No small bowel obstruction.Normal appendix. Vascular/Lymphatic: No aortic aneurysm. Diffuse aortoiliac atherosclerosis. No intraabdominal or pelvic lymphadenopathy. Reproductive: No prostatomegaly.No free pelvic fluid. Other: No pneumoperitoneum, ascites, or mesenteric inflammation. Musculoskeletal: No acute fracture or destructive lesion.  Multilevel degenerative disc disease of the spine. IMPRESSION: No acute intra-abdominal or pelvic abnormality. Aortic Atherosclerosis (ICD10-I70.0). Electronically Signed   By: Rogelia Myers M.D.   On: 09/27/2023 11:27   DG Chest Portable 1 View Result Date: 09/27/2023 CLINICAL DATA:  Cough and shortness of breath. EXAM: PORTABLE CHEST 1 VIEW COMPARISON:  09/25/2021 FINDINGS: Lungs are adequately inflated and otherwise clear. Cardiomediastinal silhouette and remainder of the exam is unchanged. IMPRESSION: No active disease. Electronically Signed   By: Toribio Agreste M.D.   On: 09/27/2023 10:48     Procedures   Medications Ordered in the ED  ipratropium-albuterol  (DUONEB) 0.5-2.5 (3) MG/3ML nebulizer solution 3 mL (has no administration in time range)  sodium chloride  0.9 % bolus 1,000 mL (1,000 mLs Intravenous New Bag/Given 09/27/23 0916)  ipratropium-albuterol  (DUONEB) 0.5-2.5 (3) MG/3ML nebulizer solution 3 mL (3 mLs Nebulization Given 09/27/23 0917)  morphine  (PF) 4 MG/ML injection 4 mg (4 mg Intravenous Given 09/27/23 0917)  ondansetron  (ZOFRAN ) injection 4 mg (4 mg Intravenous Given 09/27/23 0916)  methylPREDNISolone  sodium succinate (SOLU-MEDROL ) 125 mg/2 mL injection 70 mg (70 mg Intravenous Given 09/27/23 1025)  iohexol  (OMNIPAQUE ) 350 MG/ML injection 75 mL (75 mLs Intravenous  Contrast Given 09/27/23 1015)  ipratropium-albuterol  (DUONEB) 0.5-2.5 (3) MG/3ML nebulizer solution 3 mL (3 mLs Nebulization Given 09/27/23 1042)  albuterol  (PROVENTIL ) (2.5 MG/3ML) 0.083% nebulizer solution 2.5 mg (2.5 mg Nebulization Given 09/27/23 1042)  guaiFENesin  (ROBITUSSIN) 100 MG/5ML liquid 5 mL (5 mLs Oral Given 09/27/23 1037)                                    Medical Decision Making Amount and/or Complexity of Data Reviewed Labs: ordered.    Details: Normal troponin Radiology: ordered and independent interpretation performed.    Details: No diverticulitis ECG/medicine tests: ordered and independent  interpretation performed.    Details: Sinus tachycardia  Risk OTC drugs. Prescription drug management. Decision regarding hospitalization.   Ultimately, I think patient is suffering from a COPD exacerbation which is causing his cough and wheezing and shortness of breath.  He is improving with treatments though still remains wheezing and short of breath.  His abdominal pain might just be from the cough as a CT is unremarkable and labs are unremarkable.  Headache seems to be coming and going and there are no neurodeficits.  I do not think CT head needed.  Doubt PE.  No signs of pneumonia.  He was given Solu-Medrol  as well as nebs.  He is improving but will need admission, discussed with the family practice team.     Final diagnoses:  COPD exacerbation [facility])    ED Discharge Orders     None          Freddi Hamilton, MD 09/27/23 [phone]

## 2023-09-27 NOTE — Assessment & Plan Note (Addendum)
 Persistent presentation in ED, most likely secondary to excessive albuterol  treatment, though also presenting with tachypnea and not hypoxic.  Cannot rule out PE though it is respiratory status most likely secondary to COPD exacerbation -Continue to monitor, if persistently tachycardic and tachypnea unresolved or change in oxygen saturation can consider CTA to evaluate for PE

## 2023-09-27 NOTE — Plan of Care (Cosign Needed)
 FMTS Brief Progress Note  S:  Mr. Carithers is a 58 y.o. male presenting with shortness of breath likely acute COPD exacerbation. Patient with history of sensorineural hearing loss writing was used to communication. Patient with worsening cough which has been keeping him up every night. He denies fever, SOB, running nose, or pain. State last BM was 5 days ago.    O: BP (!) 134/94 (BP Location: Left Arm)   Pulse (!) 108   Temp 98.4 F (36.9 C) (Oral)   Resp 18   Ht 5' 7 (1.702 m)   Wt 74.2 kg   SpO2 (!) 89%   BMI 25.62 kg/m   Physical Exam Cardiovascular:     Rate and Rhythm: Normal rate.  Pulmonary:     Breath sounds: Examination of the right-upper field reveals wheezing. Examination of the left-upper field reveals wheezing. Examination of the right-lower field reveals decreased breath sounds. Examination of the left-lower field reveals decreased breath sounds. Decreased breath sounds and wheezing present.  Abdominal:     Palpations: Abdomen is soft.     Comments: Last BM was 09/24/23  Skin:    General: Skin is warm and dry.     Capillary Refill: Capillary refill takes less than 2 seconds.     A/P: Patient sat at bedside during exam. His main respiratory symptoms has been worsening cough that prevent him from sleep and that he has not been able to sleep in the last few night. Moderate wheeze on upper respiratory on auscutation but oxygen sat above 90% on RA  COPD with acute exacerbation  Patient w/ moderate coughing spell preventing him from rest - Will order one time guaiFENesin -codeine  100-10 MG/5ML solution 10 mL  to help his cough and sleep tonight - Continue albuterol  neb 2.5 mg Q4H PRN - Continue Robitussin DM 5 ml Q4HPRN - Continue BREO ELLIPTA  200-25 MCG/ACT 2 puff BID - ipratropium-albuterol  (DUONEB) 0.5-2.5 (3) MG/3ML nebulizer solution 3 mL Q6H - May consider adding Tessalon  perles if coughing not improve w/ current regimen  Constipation State last BM was Monday  9/22. Stool burden on CT - Continue polyethylene glycol (MIRALAX  / GLYCOLAX ) packet 17 g  - Continue senna (SENOKOT) tablet 8.6 mg BID - Starting Milk of Mag for tmrw AM    Suzen Houston NOVAK, DO 09/27/2023, 8:37 PM PGY-1, [facility] Night Resident  Please page [phone] with questions.

## 2023-09-27 NOTE — Assessment & Plan Note (Addendum)
 Well-controlled, diagnosed about 12 years ago, well-controlled on metformin  500 mg twice daily and diet modification.  Last A1c 6.8 3 years ago - Will repeat A1c here - CBGs every 4 hours - Sensitive intensity SSI

## 2023-09-27 NOTE — Assessment & Plan Note (Addendum)
 History of chronic idiopathic constipation for several years, no current bowel regimen at home.  Last bowel movement 9/22.  Some abdominal distention and left lower quadrant tenderness on exam. - Significant stool burden on CTAP - Soapsuds enema - Senna twice daily, MiraLAX  twice daily - Can consider deescalating with regular BMs

## 2023-09-27 NOTE — H&P (Addendum)
 Hospital Admission History and Physical Service Pager: [phone]  Patient name: Alan Mendoza Medical record number: [id number] Date of Birth: 12/24/1965 Age: 58 y.o. Gender: male  Primary Care Provider: Maree Leni Edyth DELENA, MD Consultants: NONE Code Status: DNR-limited Preferred Emergency Contact:   Contact Information     Name Relation Home Work Mobile   Tucker,Jimmy Stepfather [phone]  [phone]      Other Contacts   None on File     Chief Complaint: shortness of breath and cough  Differential and Medical Decision Making:  Alan Mendoza is a 58 y.o. male presenting with shortness of breath .  Differential for this patient's presentation of this includes acute COPD exacerbation (less likely given increased cough, sputum production, nonadherence to controller meds, previous COPD exacerbations), pneumonia (less likely given nonfocal CXR, normal CBC, no fevers, nausea), and URI (less likely with negative quad RVP, no white count, no sick contacts), PE (consistent tachycardia, tachypnea on presentation, cough, less likely with no hemoptysis, saturating well on room air, no history of clots), and ACS (CAD s/p stent, less likely with normal flat trending troponins, no chest pain, normal EKG).  Assessment & Plan COPD with acute exacerbation (HCC) Saturating well on room air, vitals notable for tachycardia and tachypnea.  Labs unremarkable.  in the context of inadequate medication adherence suspect likely secondary to that with possible viral infection.  Low suspicion for pneumonia with clear CXR and afebrile. - Admit to FMTS Dr. Billee, MedSurg floor, vitals per floor - DuoNebs every 6 hours - Start home inhaler 2 puffs 2 times a day, formulary equivalent is Breo Ellipta . - Start ceftriaxone  1 g daily (9/25 - 9/29) - Ordered Flutter valve and incentive spirometry, can consider hypertonic saline given productive cough - Received 1 dose of IV Solu-Medrol  in ED, will transition  to prednisone  to complete 5-day course 9/25-9/29 - Continue to monitor respiratory status for need of increased DuoNeb treatment/supplemental oxygen Chronic constipation History of chronic idiopathic constipation for several years, no current bowel regimen at home.  Last bowel movement 9/22.  Some abdominal distention and left lower quadrant tenderness on exam. - Significant stool burden on CTAP - Soapsuds enema - Senna twice daily, MiraLAX  twice daily - Can consider deescalating with regular BMs Type 2 diabetes mellitus with other specified complication (HCC) Well-controlled, diagnosed about 12 years ago, well-controlled on metformin  500 mg twice daily and diet modification.  Last A1c 6.8 3 years ago - Will repeat A1c here - CBGs every 4 hours - Sensitive intensity SSI Sinus tachycardia Persistent presentation in ED, most likely secondary to excessive albuterol  treatment, though also presenting with tachypnea and not hypoxic.  Cannot rule out PE though it is respiratory status most likely secondary to COPD exacerbation -Continue to monitor, if persistently tachycardic and tachypnea unresolved or change in oxygen saturation can consider CTA to evaluate for PE Chronic health problem CAD-continue aspirin  and atorvastatin  Tobacco use disorder-interested in receiving resources at discharge Sensorineural hearing loss-use ASL interpreter MDD-previously treated with duloxetine   FEN/GI: Carb modified/heart healthy diet VTE Prophylaxis: Lovenox   Disposition: MedSurg  History of Present Illness:  Alan Mendoza is a 58 y.o. male with PMHx of CAD s/p stent 2014, COPD, tobacco use disorder, T2DM, sensorineural hearing loss, and presenting with cough and shortness of breath.  Symptoms of worsening cough and shortness of breath has been on and off for a while, though they increased in the last 4 days. He is having more increased sputum as well. It is  yellow in color. Sometimes, it is thin, and  sometimes, it is thicker. No sick contacts he knows about. He does not use his symbicort  daily, and he still smokes. He was able to use his symbicort  and albuterol  inhalers much more than usual, however, because of his illness. He denies using oxygen at home. He reports being hospitalized before for COPD, most recently about 2-3 years ago.  In the ED, presented with restricted air movements, saturating well on RA, tachypneic and tachycardic.  She only endorsed left lower quadrant tenderness.  CMP, CBC, UA, troponins were unremarkable, chest x-ray found no focal abnormalities, CTAP with no acute abnormalities.  Received IV methylprednisolone  and duonebs and patient continues to have tachycardia, tachypnea improved.  Review Of Systems: Per HPI  Pertinent Past Medical History: MDD Chronic bronchitis COPD CAD s/p stent 2014 Sensorineural hearing loss Tobacco use disorder Type 2 diabetes mellitus since 2013 Remainder reviewed in history tab.   Pertinent Past Surgical History: Cardiac catheterization s/p stent 11/21/2012 Remainder reviewed in history tab.   Pertinent Social History: Tobacco use: Yes, about <5 cigarettes a day now, smoked about 0.5-1 pack per day in the past for over 40 years; he has quit about four times for 2-4 weeks Alcohol  use: none Other Substance use: none Lives alone  Pertinent Family History: Mother: diabetes Negative for colon, esophageal, pancreatic, rectal, stomach cancers Unsure of other family history  Important Outpatient Medications: Albuterol  2 puffs Q4 as needed Aspirin  81 mg daily Atorvastatin  80 mg daily Metformin  500 mg twice daily Symbicort  2 puffs twice daily Trelegy Ellipta 1 puff daily  Objective: BP 139/85   Pulse (!) 110   Temp 98.6 F (37 C) (Oral)   Resp (!) 22   Ht 5' 7 (1.702 m)   Wt 68.5 kg   SpO2 94%   BMI 23.65 kg/m  Exam: General: Resting comfortably, no acute distress Cardiovascular: Tachycardia, with regular rhythm, no  murmurs rubs or gallops Respiratory: Diffuse expiratory wheezes, productive cough Gastrointestinal: Moderate left lower quadrant tenderness, moderate abdominal distention, no rebound or guarding MSK: Extremities nonedematous, nontender Derm: Dry, cap refill less than 2 seconds Neuro: Alert and oriented x4, knows that he will die if he codes during this hospitalization given his DNR-limited status Psych: Pleasant, reactive affect  Labs:  CBC BMET  Recent Labs  Lab 09/27/23 0908 09/27/23 0914  WBC 6.0  --   HGB 14.0 15.3  HCT 44.7 45.0  PLT 248  --    Recent Labs  Lab 09/27/23 0908 09/27/23 0914  NA 138 141  K 3.5 3.5  CL 104 103  CO2 21*  --   BUN 14 14  CREATININE 0.87 0.80  GLUCOSE 120* 119*  CALCIUM  9.0  --     Pertinent additional labs alk phos 95, lipase 14, AST 35, ALT 32, T. bili 1.0, T protein 7.7, troponin 7 repeat 7, respiratory virus quad panel negative negative, urinalysis small hemoglobin, 5 ketones, rare bacteria.  EKG: Sinus rhythm, no acute ST-T changes, borderline QTc prolongation   Imaging Studies Performed:  Chest x-ray Impression from Radiologist: No active disease  My Interpretation: Possible atelectasis of right lower lobe, pulmonary congestion perihilar lymph node  CTAP with contrast IMPRESSION: No acute intra-abdominal or pelvic abnormality.  Lorrane Pac, MD 09/27/2023, 1:40 PM PGY-1, [facility]  FPTS Intern pager: [phone], text pages welcome Secure chat group [facility] Teaching Service   I agree with the assessment and plan as documented above.  Stuart Redo, MD PGY-3, [facility]

## 2023-09-28 ENCOUNTER — Other Ambulatory Visit (HOSPITAL_COMMUNITY): Payer: Self-pay

## 2023-09-28 ENCOUNTER — Telehealth (HOSPITAL_COMMUNITY): Payer: Self-pay | Admitting: Pharmacy Technician

## 2023-09-28 ENCOUNTER — Inpatient Hospital Stay (HOSPITAL_COMMUNITY)

## 2023-09-28 DIAGNOSIS — R109 Unspecified abdominal pain: Secondary | ICD-10-CM | POA: Diagnosis present

## 2023-09-28 DIAGNOSIS — E1169 Type 2 diabetes mellitus with other specified complication: Secondary | ICD-10-CM | POA: Diagnosis present

## 2023-09-28 DIAGNOSIS — K5904 Chronic idiopathic constipation: Secondary | ICD-10-CM | POA: Diagnosis present

## 2023-09-28 DIAGNOSIS — J441 Chronic obstructive pulmonary disease with (acute) exacerbation: Secondary | ICD-10-CM | POA: Diagnosis present

## 2023-09-28 DIAGNOSIS — H905 Unspecified sensorineural hearing loss: Secondary | ICD-10-CM | POA: Diagnosis present

## 2023-09-28 DIAGNOSIS — Z7951 Long term (current) use of inhaled steroids: Secondary | ICD-10-CM | POA: Diagnosis not present

## 2023-09-28 DIAGNOSIS — I2510 Atherosclerotic heart disease of native coronary artery without angina pectoris: Secondary | ICD-10-CM | POA: Diagnosis present

## 2023-09-28 DIAGNOSIS — Z7984 Long term (current) use of oral hypoglycemic drugs: Secondary | ICD-10-CM | POA: Diagnosis not present

## 2023-09-28 DIAGNOSIS — F329 Major depressive disorder, single episode, unspecified: Secondary | ICD-10-CM | POA: Diagnosis present

## 2023-09-28 DIAGNOSIS — Z955 Presence of coronary angioplasty implant and graft: Secondary | ICD-10-CM | POA: Diagnosis not present

## 2023-09-28 DIAGNOSIS — Z79899 Other long term (current) drug therapy: Secondary | ICD-10-CM | POA: Diagnosis not present

## 2023-09-28 DIAGNOSIS — Z23 Encounter for immunization: Secondary | ICD-10-CM | POA: Diagnosis present

## 2023-09-28 DIAGNOSIS — R000 Tachycardia, unspecified: Secondary | ICD-10-CM | POA: Diagnosis present

## 2023-09-28 DIAGNOSIS — Z66 Do not resuscitate: Secondary | ICD-10-CM | POA: Diagnosis present

## 2023-09-28 DIAGNOSIS — T50996A Underdosing of other drugs, medicaments and biological substances, initial encounter: Secondary | ICD-10-CM | POA: Diagnosis present

## 2023-09-28 DIAGNOSIS — F17200 Nicotine dependence, unspecified, uncomplicated: Secondary | ICD-10-CM | POA: Diagnosis present

## 2023-09-28 DIAGNOSIS — R519 Headache, unspecified: Secondary | ICD-10-CM | POA: Diagnosis present

## 2023-09-28 LAB — GLUCOSE, CAPILLARY
Glucose-Capillary: 103 mg/dL — ABNORMAL HIGH (ref 70–99)
Glucose-Capillary: 106 mg/dL — ABNORMAL HIGH (ref 70–99)
Glucose-Capillary: 114 mg/dL — ABNORMAL HIGH (ref 70–99)
Glucose-Capillary: 140 mg/dL — ABNORMAL HIGH (ref 70–99)
Glucose-Capillary: 181 mg/dL — ABNORMAL HIGH (ref 70–99)

## 2023-09-28 LAB — CBC
HCT: 43.3 % (ref 39.0–52.0)
Hemoglobin: 13.5 g/dL (ref 13.0–17.0)
MCH: 27.4 pg (ref 26.0–34.0)
MCHC: 31.2 g/dL (ref 30.0–36.0)
MCV: 87.8 fL (ref 80.0–100.0)
Platelets: 241 K/uL (ref 150–400)
RBC: 4.93 MIL/uL (ref 4.22–5.81)
RDW: 12.5 % (ref 11.5–15.5)
WBC: 6.9 K/uL (ref 4.0–10.5)
nRBC: 0 % (ref 0.0–0.2)

## 2023-09-28 LAB — BASIC METABOLIC PANEL WITH GFR
Anion gap: 14 (ref 5–15)
BUN: 15 mg/dL (ref 6–20)
CO2: 20 mmol/L — ABNORMAL LOW (ref 22–32)
Calcium: 8.9 mg/dL (ref 8.9–10.3)
Chloride: 105 mmol/L (ref 98–111)
Creatinine, Ser: 0.86 mg/dL (ref 0.61–1.24)
GFR, Estimated: >60 mL/min (ref >60)
Glucose, Bld: 108 mg/dL — ABNORMAL HIGH (ref 70–99)
Potassium: 3.7 mmol/L (ref 3.5–5.1)
Sodium: 139 mmol/L (ref 135–145)

## 2023-09-28 MED ORDER — BUDESON-GLYCOPYRROL-FORMOTEROL 160-9-4.8 MCG/ACT IN AERO
2.0000 | INHALATION_SPRAY | Freq: Two times a day (BID) | RESPIRATORY_TRACT | Status: DC
Start: 2023-09-28 — End: 2023-09-30
  Administered 2023-09-28 – 2023-09-30 (×5): 2 via RESPIRATORY_TRACT
  Filled 2023-09-28: qty 5.9

## 2023-09-28 MED ORDER — IOHEXOL 350 MG/ML SOLN
75.0000 mL | Freq: Once | INTRAVENOUS | Status: AC | PRN
Start: 2023-09-28 — End: 2023-09-28
  Administered 2023-09-28: 75 mL via INTRAVENOUS

## 2023-09-28 MED ORDER — ACETAMINOPHEN 325 MG PO TABS
650.0000 mg | ORAL_TABLET | Freq: Four times a day (QID) | ORAL | Status: DC
  Administered 2023-09-28 – 2023-09-30 (×7): 650 mg via ORAL
  Filled 2023-09-28 (×7): qty 2

## 2023-09-28 MED ORDER — BUDESON-GLYCOPYRROL-FORMOTEROL 160-9-4.8 MCG/ACT IN AERO
2.0000 | INHALATION_SPRAY | Freq: Two times a day (BID) | RESPIRATORY_TRACT | 0 refills | Status: AC
  Filled 2023-09-28: qty 10.7, 30d supply, fill #0

## 2023-09-28 MED ORDER — CEFUROXIME AXETIL 250 MG PO TABS
500.0000 mg | ORAL_TABLET | Freq: Two times a day (BID) | ORAL | Status: DC
  Administered 2023-09-28 – 2023-09-30 (×5): 500 mg via ORAL
  Filled 2023-09-28 (×7): qty 2

## 2023-09-28 MED ORDER — POLYETHYLENE GLYCOL 3350 17 GM/SCOOP PO POWD
17.0000 g | Freq: Every day | ORAL | 0 refills | Status: DC
  Filled 2023-09-28: qty 238, 14d supply, fill #0

## 2023-09-28 MED ORDER — ACETAMINOPHEN 650 MG RE SUPP: 650.0000 mg | Freq: Four times a day (QID) | RECTAL | Status: DC

## 2023-09-28 MED ORDER — IPRATROPIUM-ALBUTEROL 0.5-2.5 (3) MG/3ML IN SOLN
3.0000 mL | RESPIRATORY_TRACT | Status: DC
  Administered 2023-09-28 – 2023-09-29 (×8): 3 mL via RESPIRATORY_TRACT
  Filled 2023-09-28 (×8): qty 3

## 2023-09-28 MED ORDER — PREDNISONE 10 MG PO TABS
40.0000 mg | ORAL_TABLET | Freq: Every day | ORAL | Status: DC
  Administered 2023-09-29 – 2023-09-30 (×2): 40 mg via ORAL
  Filled 2023-09-28 (×2): qty 4

## 2023-09-28 MED ORDER — MENTHOL 3 MG MT LOZG
1.0000 | LOZENGE | OROMUCOSAL | Status: DC | PRN
  Administered 2023-09-28 – 2023-09-30 (×3): 3 mg via ORAL
  Filled 2023-09-28: qty 9

## 2023-09-28 MED ORDER — CEFUROXIME AXETIL 500 MG PO TABS
500.0000 mg | ORAL_TABLET | Freq: Two times a day (BID) | ORAL | 0 refills | Status: AC
  Filled 2023-09-28: qty 7, 4d supply, fill #0

## 2023-09-28 MED ORDER — ALBUTEROL SULFATE (2.5 MG/3ML) 0.083% IN NEBU
2.5000 mg | INHALATION_SOLUTION | Freq: Four times a day (QID) | RESPIRATORY_TRACT | Status: DC | PRN
  Administered 2023-09-29: 2.5 mg via RESPIRATORY_TRACT
  Filled 2023-09-28: qty 3

## 2023-09-28 MED ORDER — SENNA 8.6 MG PO TABS
1.0000 | ORAL_TABLET | Freq: Two times a day (BID) | ORAL | 0 refills | Status: DC
  Filled 2023-09-28: qty 30, 15d supply, fill #0

## 2023-09-28 MED ORDER — PHENOL 1.4 % MT LIQD
1.0000 | OROMUCOSAL | Status: DC
  Administered 2023-09-28 – 2023-09-29 (×5): 1 via OROMUCOSAL
  Filled 2023-09-28: qty 177

## 2023-09-28 MED ORDER — ACETAMINOPHEN 325 MG PO TABS: 650.0000 mg | ORAL_TABLET | Freq: Four times a day (QID) | ORAL | Status: AC

## 2023-09-28 MED ORDER — IBUPROFEN 600 MG PO TABS: 600.0000 mg | ORAL_TABLET | Freq: Four times a day (QID) | ORAL | Status: AC

## 2023-09-28 MED ORDER — PREDNISONE 20 MG PO TABS
40.0000 mg | ORAL_TABLET | Freq: Every day | ORAL | 0 refills | Status: AC
  Filled 2023-09-28: qty 6, 3d supply, fill #0

## 2023-09-28 MED ORDER — IBUPROFEN 200 MG PO TABS
600.0000 mg | ORAL_TABLET | Freq: Four times a day (QID) | ORAL | Status: DC
Start: 2023-09-28 — End: 2023-09-30
  Administered 2023-09-28 – 2023-09-30 (×8): 600 mg via ORAL
  Filled 2023-09-28 (×8): qty 3

## 2023-09-28 MED ORDER — MAGIC MOUTHWASH: 2.0000 mL | Freq: Three times a day (TID) | ORAL | Status: DC | PRN

## 2023-09-28 NOTE — Plan of Care (Signed)
  Problem: Education: Goal: Knowledge of General Education information will improve Description: Including pain rating scale, medication(s)/side effects and non-pharmacologic comfort measures Outcome: Progressing   Problem: Health Behavior/Discharge Planning: Goal: Ability to manage health-related needs will improve Outcome: Progressing   Problem: Clinical Measurements: Goal: Ability to maintain clinical measurements within normal limits will improve Outcome: Progressing Goal: Will remain free from infection Outcome: Progressing Goal: Diagnostic test results will improve Outcome: Progressing Goal: Respiratory complications will improve Outcome: Progressing Goal: Cardiovascular complication will be avoided Outcome: Progressing   Problem: Activity: Goal: Risk for activity intolerance will decrease Outcome: Progressing   Problem: Nutrition: Goal: Adequate nutrition will be maintained Outcome: Progressing   Problem: Coping: Goal: Level of anxiety will decrease Outcome: Progressing   Problem: Elimination: Goal: Will not experience complications related to bowel motility Outcome: Progressing Goal: Will not experience complications related to urinary retention Outcome: Progressing   Problem: Pain Managment: Goal: General experience of comfort will improve and/or be controlled Outcome: Progressing   Problem: Safety: Goal: Ability to remain free from injury will improve Outcome: Progressing   Problem: Education: Goal: Ability to describe self-care measures that may prevent or decrease complications (Diabetes Survival Skills Education) will improve Outcome: Progressing Goal: Individualized Educational Video(s) Outcome: Progressing   Problem: Coping: Goal: Ability to adjust to condition or change in health will improve Outcome: Progressing   Problem: Health Behavior/Discharge Planning: Goal: Ability to identify and utilize available resources and services will  improve Outcome: Progressing Goal: Ability to manage health-related needs will improve Outcome: Progressing   Problem: Nutritional: Goal: Maintenance of adequate nutrition will improve Outcome: Progressing Goal: Progress toward achieving an optimal weight will improve Outcome: Progressing   Problem: Skin Integrity: Goal: Risk for impaired skin integrity will decrease Outcome: Progressing   Problem: Tissue Perfusion: Goal: Adequacy of tissue perfusion will improve Outcome: Progressing

## 2023-09-28 NOTE — Care Management Obs Status (Signed)
 MEDICARE OBSERVATION STATUS NOTIFICATION   Patient Details  Name: Alan Mendoza MRN: [id number] Date of Birth: Nov 14, 1965   Medicare Observation Status Notification Given:       Claretta Crawford LILLETTE Claretta Crawford, verbally reviewed observation notice with [facility] [phone] telephonically at [phone].   , 11:12 AM

## 2023-09-28 NOTE — Progress Notes (Addendum)
 FMTS Attending Daily Note: Alan Keeling, MD  Team Pager [phone] Pager [phone] I have personally seen and examined this patient, reviewed their chart and results.  Attestation of Supervision of Student:  I confirm that I have verified the information documented in the medical student's note and that I have also personally performed the history, physical exam and all medical decision making activities.  I have verified that all services and findings are accurately documented in this student's note; and I agree with management and plan as outlined in the documentation. I have also made any necessary editorial changes.  Pt notes not much improvement since hospital today. He remains tachycardic and does note some sharp stabbing chest pain with inspiration and coughing, not reproducible on palpation. Still with dry cough no sputum production. He is requiring O2 with ambulation. Will get CTPE to rule out PE. He also notes not liking taking his Trelegy bc only once daily, will switch to Breztri . Continue PO antibiotics and steroids for COPD exacerbation.         Daily Progress Note Intern Pager: [phone]  Patient name: Alan Mendoza Medical record number: [id number] Date of birth: 10/09/1965 Age: 58 y.o. Gender: male  Primary Care Provider: Maree Leni Edyth DELENA, MD Consultants: none Code Status: DNR-limited  Pt Overview and Major Events to Date:  9/25: admitted to FMTS  Medical Decision Making: Alan Mendoza is a 58 y.o. male with a PMH of COPD, CAD s/p stent 2014, sensorineural hearing loss, Tobacco use disorder, T2DM, and MDD who was admitted to the hospital for a COPD exacerbation.  Assessment & Plan COPD with acute exacerbation (HCC) Saturating well on room air, vitals notable for tachycardia.  Labs unremarkable. Suspect likely secondary to possible viral infection.  Low suspicion for pneumonia with clear CXR and afebrile. Pt also endorses throat and chest pain only slightly improved by  tylenol . - Increase DuoNebs to every 4 hours - Albuterol  nebulizer every 6 hours PRN. - Tylenol  Q6H PRN and ibuprofen  QID scheduled for pain - scheduled chloraseptic Q4h - start incentive spirometry - Stop home inhaler 2 puffs 2 times a day,and start Breztri  2 puffs BID started - change ceftriaxone  1 g daily (9/25 - 9/29) to cefuroxime  500 BID PO.  - Ordered Flutter valve and incentive spirometry, can consider hypertonic saline given productive cough - Continue Prednisone  to complete 5-day course 9/25-9/29 - Continue to monitor respiratory status for need of supplemental oxygen, needed O2 with ambulation, will order this with home health Chronic constipation History of chronic idiopathic constipation for several years, no current bowel regimen at home.  Last bowel movement 9/22.  Some abdominal distention and but nontender on exam. - Significant stool burden on CTAP - Soapsuds enema ordered - Senna twice daily, MiraLAX  twice daily - Can consider deescalating with regular BMs Type 2 diabetes mellitus with other specified complication (HCC) Well-controlled, diagnosed about 12 years ago, well-controlled on metformin  500 mg twice daily and diet modification.  A1c 6.4. - discontinue CBG monitoring while in hospital Sinus tachycardia Persistent presentation in ED, most likely secondary to excessive albuterol  treatment, though also some sharp stabbing chest pain, pleuritic and worse with coughing, no present at rest, not reproducible on palpation. Will get CTPA to rule out PE.  Chronic health problem CAD-continue aspirin  and atorvastatin  Tobacco use disorder-interested in receiving resources at discharge Sensorineural hearing loss-use ASL interpreter MDD-previously treated with duloxetine    FEN/GI: Carb modified/ heart healthy PPx: Lovenox  Dispo:Home pending clinical improvement .  Subjective:  Pt endorses  sharp chest pain that is only present while coughing. The pain does not radiate  anywhere. Pt reports he feels mucus, but is unable to bring it up with the coughing. Pt also complains of throat pain, unsure if this is from cough or infectious cause. He endorses some abdominal pain. Denies having a bowel movement yet. He states he was only able to sleep about 2 hours last night 2/2 cough.   Objective: Temp:  [98.3 F (36.8 C)-98.9 F (37.2 C)] 98.9 F (37.2 C) (09/26 0441) Pulse Rate:  [80-116] 110 (09/26 0441) Resp:  [14-29] 19 (09/26 0441) BP: (105-186)/(68-100) 140/100 (09/26 0441) SpO2:  [87 %-96 %] 87 % (09/26 0441) Weight:  [68.5 kg-74.2 kg] 74.2 kg (09/25 1500) Physical Exam: General: uncomfortable appearing, sitting up on the side of the bed tripod breathing, NAD Cardiovascular: regular rate and rhythm. Pain not reproducible with chest wall palpation.  Respiratory: labored breathing. Expiratory wheezes throughout.  Abdomen: Distended and Full feeling on exam. Mildly tender throughout Extremities: Moving all extremities normally according to baseline. No edema noted.   Laboratory: Most recent CBC Lab Results  Component Value Date   WBC 6.9 09/28/2023   HGB 13.5 09/28/2023   HCT 43.3 09/28/2023   MCV 87.8 09/28/2023   PLT 241 09/28/2023   Most recent BMP    Latest Ref Rng & Units 09/28/2023    5:00 AM  BMP  Glucose 70 - 99 mg/dL 891   BUN 6 - 20 mg/dL 15   Creatinine 9.38 - 1.24 mg/dL 9.13   Sodium 864 - 854 mmol/L 139   Potassium 3.5 - 5.1 mmol/L 3.7   Chloride 98 - 111 mmol/L 105   CO2 22 - 32 mmol/L 20   Calcium  8.9 - 10.3 mg/dL 8.9    Imaging/Diagnostic Tests: No new imaging acquired.   Alan Mendoza, Medical Student 09/28/2023, 7:37 AM  MS4 AI, [facility] FPTS Intern pager: [phone], text pages welcome Secure chat group [facility] Teaching Service

## 2023-09-28 NOTE — Assessment & Plan Note (Addendum)
 Saturating well on room air, vitals notable for tachycardia.  Labs unremarkable. Suspect likely secondary to possible viral infection.  Low suspicion for pneumonia with clear CXR and afebrile. Pt also endorses throat and chest pain only slightly improved by tylenol . - Increase DuoNebs to every 4 hours - Albuterol  nebulizer every 6 hours PRN. - Tylenol  Q6H PRN and ibuprofen  QID scheduled for pain - scheduled chloraseptic Q4h - start incentive spirometry - Stop home inhaler 2 puffs 2 times a day,and start Breztri  2 puffs BID started - change ceftriaxone  1 g daily (9/25 - 9/29) to cefuroxime  500 BID PO.  - Ordered Flutter valve and incentive spirometry, can consider hypertonic saline given productive cough - Continue Prednisone  to complete 5-day course 9/25-9/29 - Continue to monitor respiratory status for need of supplemental oxygen, needed O2 with ambulation, will order this with home health

## 2023-09-28 NOTE — Assessment & Plan Note (Addendum)
 History of chronic idiopathic constipation for several years, no current bowel regimen at home.  Last bowel movement 9/22.  Some abdominal distention and but nontender on exam. - Significant stool burden on CTAP - Soapsuds enema ordered - Senna twice daily, MiraLAX  twice daily - Can consider deescalating with regular BMs

## 2023-09-28 NOTE — Assessment & Plan Note (Addendum)
 Persistent presentation in ED, most likely secondary to excessive albuterol  treatment, though also some sharp stabbing chest pain, pleuritic and worse with coughing, no present at rest, not reproducible on palpation. Will get CTPA to rule out PE.

## 2023-09-28 NOTE — Discharge Summary (Addendum)
 Family Medicine Teaching [facility] Discharge Summary  Patient name: Alan Mendoza Medical record number: [id number] Date of birth: Dec 24, 1965 Age: 58 y.o. Gender: male Date of Admission: 09/27/2023  Date of Discharge: 09/29/2023 Admitting Physician: Fairy Amy, MD  Primary Care Provider: Maree Leni Edyth DELENA, MD Consultants: None  Indication for Hospitalization: Dyspnea  Discharge Diagnoses/Problem List:  Principal Problem for Admission: COPD acute exacerbation Other Problems addressed during stay:    Type 2 diabetes mellitus with other specified complication [facility])   Chronic constipation   Sinus tachycardia  Brief Hospital Course:  Alan Mendoza is a 58 y.o. male with a PMH of COPD, CAD s/p stent 2014, sensorineural hearing loss, tobacco use disorder, T2DM, and MDD who was admitted to the hospital for a COPD exacerbation. His hospital course is as follows:  Acute COPD exacerbation Patient presented from home with shortness of breath, tachypnea, tachycardia, and worsening productive cough in the setting of medication non-adherence to home Trelegy and possible underlying URI. CXR showed no active disease. Patient was started on DuoNebs, Prednisone  (9/25-9/29), Ceftriaxone  followed by Cefuroxime  (9/26-10/1), and home inhaler with overall symptomatic improvement. Switched patient from Trelegy and Symbicort  to Breztri , as patient preferred a medication that is administered twice daily.  Chronic constipation Patient reports a history of chronic constipation. On admission, physical exam notable for intermittent abdominal pain and fullness. Patient found to have significant stool burden on CT. Patient successfully had bowel movement with scheduled bowel regimen (Senna and Miralax  BID, Milk of Magnesia) and soap suds enema.  Sinus Tachycardia Patient found to be persistently tachycardic in the ED and throughout admission. Likely secondary to excessive albuterol  administration. Patient  stayed well oxygenated throughout admission on room air. CTA PE without acute findings. Tachycardia resolved spontaneously and vitals were stable prior to discharge.  PCP Follow-up Recommendations: Consider outpatient bowel regimen. Evaluate for improvement with Breztri   Results/Tests Pending at Time of Discharge:  Unresulted Labs (From admission, onward)     Start     Ordered   09/27/23 1635  Expectorated Sputum Assessment w Gram Stain, Rflx to Resp Cult  ONCE - URGENT,   URGENT        09/27/23 1634           Disposition: Home  Discharge Condition: Stable  Discharge Exam:  Vitals:   09/30/23 0724 09/30/23 0842  BP:  125/89  Pulse: 86 91  Resp: 18 18  Temp:  97.9 F (36.6 C)  SpO2: 97% 94%   Physical Exam: General: resting comfortably in bed, NAD Cardiovascular: RRR; no m/r/g Respiratory: diffuse wheezing; normal respiratory effort on RA;  Abdomen: non-tender, non-distended; normal bowel sounds Extremities: moving all 4 extremities equally  Significant Procedures: None  Significant Labs and Imaging:  CXR (09/27/23) IMPRESSION: No active disease.   CTAP with contrast (09/27/23) IMPRESSION: No acute intra-abdominal or pelvic abnormality.  CT PE (09/28/23) IMPRESSION: 1. No evidence of significant pulmonary embolus. 2. Emphysematous changes and chronic bronchitic changes in the lungs with mucous plugging. No focal consolidation. 3. Mild aortic atherosclerosis.  Discharge Medications:  Allergies as of 09/30/2023   No Known Allergies      Medication List     STOP taking these medications    Symbicort  160-4.5 MCG/ACT inhaler Generic drug: budesonide -formoterol    Trelegy Ellipta 100-62.5-25 MCG/ACT Aepb Generic drug: Fluticasone -Umeclidin-Vilant       TAKE these medications    acetaminophen  325 MG tablet Commonly known as: TYLENOL  Take 2 tablets (650 mg total) by mouth every 6 (six)  hours.   albuterol  108 (90 Base) MCG/ACT inhaler Commonly  known as: VENTOLIN  HFA Inhale 2 puffs into the lungs every 4 (four) hours as needed for wheezing or shortness of breath.   atorvastatin  80 MG tablet Commonly known as: LIPITOR  Take 1 tablet (80 mg total) by mouth daily.   Breztri  Aerosphere 160-9-4.8 MCG/ACT Aero inhaler Generic drug: budesonide -glycopyrrolate -formoterol  Inhale 2 puffs into the lungs 2 (two) times daily for 13 days.   cefUROXime  500 MG tablet Commonly known as: CEFTIN  Take 1 tablet (500 mg total) by mouth 2 (two) times daily with a meal for 4 days.   ibuprofen  600 MG tablet Commonly known as: ADVIL  Take 1 tablet (600 mg total) by mouth every 6 (six) hours.   metFORMIN  500 MG tablet Commonly known as: GLUCOPHAGE  Take 1 tablet (500 mg total) by mouth 2 (two) times daily with a meal. What changed: when to take this   polyethylene glycol powder 17 GM/SCOOP powder Commonly known as: GLYCOLAX /MIRALAX  Dissolve 17 g (1 capful) in 4-8 ounces of water and take by mouth 2 (two) times daily as needed for mild constipation (Can take once a day to achieve soft, daily bowel movements).   predniSONE  20 MG tablet Commonly known as: DELTASONE  Take 2 tablets (40 mg total) by mouth daily with breakfast for 3 days.   senna 8.6 MG Tabs tablet Commonly known as: SENOKOT Take 1 tablet (8.6 mg total) by mouth 2 (two) times daily.        Discharge Instructions: Please refer to Patient Instructions section of EMR for full details.  Patient was counseled important signs and symptoms that should prompt return to medical care, changes in medications, dietary instructions, activity restrictions, and follow up appointments.   Follow-Up Appointments:  Follow-up Information     Maree Leni Edyth DELENA, MD. Schedule an appointment as soon as possible for a visit.   Specialty: Family Medicine Contact information: [address] [place] [address] [phone]                 Mannie Ashley SAILOR, MD 09/30/2023, 10:03 AM PGY-1,  [facility] Family Medicine  I have discussed the above with Dr. Mannie and agree with the documented plan. My edits for correction/addition/clarification are included above. Please see any attending notes.   Kathrine Melena, DO PGY-2,  Family Medicine 09/30/2023 3:17 PM

## 2023-09-28 NOTE — Progress Notes (Signed)
 OT Cancellation Note  Patient Details Name: Jaqualin Serpa MRN: [id number] DOB: 19-Apr-1965   Cancelled Treatment:    Reason Eval/Treat Not Completed: Other (comment) (Pending ASL interpreter). 8:30am - OT has been in contact with [facility]pre[facility] services, contacted [facility] at [phone]. 9:30AM- received call back from Stacy at [phone] who is coordinating in person ASL for therapy eval. The interpreter she hopes to use has an appointment with another in [place] at 11:00 and she is hopeful that we can be next on the schedule (would be after 12) OT will hold full evaluation to utilize in person ASL as requested by Pt.   OT did go in Pt's room and through writing explained that therapy services were scheduling in person interpreter and it eval would not be until the afternoon. Pt verified that he was good with that plan.   Leita PARAS Finley Chevez 09/28/2023, 10:01 AM  Leita DEL OTR/L Acute Rehabilitation Services Office: [phone]

## 2023-09-28 NOTE — Evaluation (Signed)
 Physical Therapy Evaluation Patient Details Name: Alan Mendoza MRN: [id number] DOB: 1965-07-08 Today's Date: 09/28/2023  History of Present Illness  Pt is a 58 y/o M admitted on 09/27/23 after presenting with c/o cough, SOB, stomach pain. Pt is being treated for COPD exacerbation. PMH: CAD s/p stent 2014, COPD, tobacco use disorder, MD2, sensorineural hearing loss, MDD  Clinical Impression  Pt seen for PT evaluation with pt agreeable, in person ASL (Amy) present for session. Pt reports prior to admission he was ambulatory without AD, lives in apartment with flight of stairs to access, using the bus for transportation. On this date, pt is able to ambulate in hallway without AD with supervision, gait pattern as noted below. Pt does endorse increased difficulty with breathing with mobility - educated pt on pursed lip breathing, pt's SpO2 as low as 84-85%, increased to 88% with standing rest break; nurse made aware. Will continue to follow pt acutely to progress gait & for stair negotiation.        If plan is discharge home, recommend the following: Help with stairs or ramp for entrance   Can travel by private vehicle        Equipment Recommendations None recommended by PT  Recommendations for Other Services       Functional Status Assessment Patient has had a recent decline in their functional status and demonstrates the ability to make significant improvements in function in a reasonable and predictable amount of time.     Precautions / Restrictions Precautions Precautions: Fall Restrictions Weight Bearing Restrictions Per Provider Order: No      Mobility  Bed Mobility               General bed mobility comments: pt received & left sitting EOB    Transfers Overall transfer level: Modified independent Equipment used: None                    Ambulation/Gait Ambulation/Gait assistance: Supervision Gait Distance (Feet): 100 Feet Assistive device: None Gait  Pattern/deviations: Antalgic, Step-through pattern Gait velocity: decreased     General Gait Details: intermittently holding to rail in hallway but no LOB  Stairs            Wheelchair Mobility     Tilt Bed    Modified Rankin (Stroke Patients Only)       Balance Overall balance assessment: Needs assistance Sitting-balance support: Feet supported Sitting balance-Leahy Scale: Normal     Standing balance support: During functional activity, No upper extremity supported Standing balance-Leahy Scale: Good                               Pertinent Vitals/Pain Pain Assessment Pain Assessment: No/denies pain    Home Living Family/patient expects to be discharged to:: Private residence Living Arrangements: Alone   Type of Home: Apartment Home Access: Stairs to enter Entrance Stairs-Rails: Doctor, general practice of Steps: flight   Home Layout: One level        Prior Function               Mobility Comments: ambulatory without AD, denies falls, uses the bus for transportation       Extremity/Trunk Assessment   Upper Extremity Assessment Upper Extremity Assessment: Overall WFL for tasks assessed    Lower Extremity Assessment Lower Extremity Assessment: Overall WFL for tasks assessed (reports hx of pinched nerve, chronic RLE weakness)       Communication  Communication Communication: Impaired Factors Affecting Communication: Other (comment) (ASL at baseline)    Cognition Arousal: Alert Behavior During Therapy: WFL for tasks assessed/performed   PT - Cognitive impairments: No apparent impairments                         Following commands: Intact       Cueing Cueing Techniques: Verbal cues     General Comments General comments (skin integrity, edema, etc.): HR 125 bpm, SpO2 as low as 84-85% on room air with gait, pursed lip breathing & static standing x1 minute to increase to 88% -- nurse made aware     Exercises     Assessment/Plan    PT Assessment Patient needs continued PT services  PT Problem List Decreased strength;Cardiopulmonary status limiting activity;Decreased activity tolerance;Decreased balance;Decreased mobility       PT Treatment Interventions DME instruction;Balance training;Neuromuscular re-education;Gait training;Stair training;Functional mobility training;Therapeutic activities;Therapeutic exercise    PT Goals (Current goals can be found in the Care Plan section)  Acute Rehab PT Goals Patient Stated Goal: none stated PT Goal Formulation: With patient Time For Goal Achievement: 10/12/23 Potential to Achieve Goals: Good    Frequency Min 1X/week     Co-evaluation PT/OT/SLP Co-Evaluation/Treatment: Yes Reason for Co-Treatment: Other (comment) (ASL interpreter appointment time) PT goals addressed during session: Mobility/safety with mobility;Balance         AM-PAC PT 6 Clicks Mobility  Outcome Measure Help needed turning from your back to your side while in a flat bed without using bedrails?: None Help needed moving from lying on your back to sitting on the side of a flat bed without using bedrails?: None Help needed moving to and from a bed to a chair (including a wheelchair)?: None Help needed standing up from a chair using your arms (e.g., wheelchair or bedside chair)?: None Help needed to walk in hospital room?: A Little Help needed climbing 3-5 steps with a railing? : A Little 6 Click Score: 22    End of Session   Activity Tolerance: Patient tolerated treatment well Patient left: in bed;with call bell/phone within reach Nurse Communication:  (O2) PT Visit Diagnosis: Unsteadiness on feet (R26.81);Other abnormalities of gait and mobility (R26.89)    Time: 8783-8765 PT Time Calculation (min) (ACUTE ONLY): 18 min   Charges:   PT Evaluation $PT Eval Low Complexity: 1 Low   PT General Charges $$ ACUTE PT VISIT: 1 Visit         Richerd Pinal, PT, DPT 09/28/23, 12:53 PM   Richerd CHRISTELLA Pinal 09/28/2023, 12:52 PM

## 2023-09-28 NOTE — Hospital Course (Addendum)
 Addison Whidbee is a 58 y.o. male with a PMH of COPD, CAD s/p stent 2014, sensorineural hearing loss, Tobacco use disorder, T2DM, and MDD who was admitted to the hospital for a COPD exacerbation. His Hospital course is as follows:  Acute COPD exacerbation: Pt presented from home with shortness of breath in the setting of noting that he does not take his Trellegy daily because it does not work effectively.CXR showed no active disease. Pt received DuoNebs, Ceftriaxone  on admission. Discharged with 3 days of cefuroxime  and prednisone . Switched patient from Trellegy to Breztri , as patient preferred a medication that is administered more often.   Chronic constipation: Pt had not had a bowel movement for 3 days before admission. Hx of chronic constipation. Intermittent abdominal pain, abdominal fullness on exam. and CT with signifcant stool burden. Pt received Senna and Mirilax BID  Sinus Tachycardia: Persistent in the ED and through admission. Likely 2/2 excessive albuterol  administration. Pt stayed well oxygenated throughout admission ORA.  CTA PE was negative, tachycardia resolved spontaneously evening of 9/26.  PCP Follow-up Recommendations: Discharged on Breztri  160-9-4.8 twice daily Consider outpatient bowel regimen

## 2023-09-28 NOTE — Evaluation (Signed)
 Occupational Therapy Evaluation Patient Details Name: Alan Mendoza MRN: [id number] DOB: [birth year] Today's Date: 09/28/2023   History of Present Illness   Pt is a 58 y/o M admitted on 09/27/23 after presenting with c/o cough, SOB, stomach pain. Pt is being treated for COPD exacerbation. PMH: CAD s/p stent 2014, COPD, tobacco use disorder, MD2, sensorineural hearing loss, MDD     Clinical Impressions  [name] was evaluated s/p the above admission list. He lives alone in an apartment at baseline. Upon evaluation the pt was limited by SOB, SpO2 84-85% during functional tasks on RA and chronic RLE weakness/limping gait. Overall he is near his mod I baseline for mobility and Adls but needed cues for pacing, energy conservation and PLB. OT to plan to follow acutely to educate on 5 Ps of energy conservation. Pt will benefit from continued acute OT services to discharge  home without need for follow up.     If plan is discharge home, recommend the following:   Assistance with cooking/housework;Assist for transportation     Functional Status Assessment   Patient has had a recent decline in their functional status and demonstrates the ability to make significant improvements in function in a reasonable and predictable amount of time.     Equipment Recommendations   None recommended by OT     Precautions/Restrictions   Precautions Precautions: Fall Recall of Precautions/Restrictions: Intact Restrictions Weight Bearing Restrictions Per Provider Order: No     Mobility Bed Mobility               General bed mobility comments: pt received & left sitting EOB    Transfers Overall transfer level: Modified independent Equipment used: None          Balance Overall balance assessment: Needs assistance Sitting-balance support: Feet supported Sitting balance-Leahy Scale: Normal     Standing balance support: During functional activity, No upper extremity supported Standing  balance-Leahy Scale: Good                             ADL either performed or assessed with clinical judgement   ADL Overall ADL's : At baseline;Modified independent           General ADL Comments: functionally he is at his mod I baseline however he needed cues for pacing and energy conservation due to SOB and low SpO2     Vision Baseline Vision/History: 1 Wears glasses Vision Assessment?: No apparent visual deficits     Perception Perception: Within Functional Limits       Praxis Praxis: WFL       Pertinent Vitals/Pain Pain Assessment Pain Assessment: No/denies pain     Extremity/Trunk Assessment Upper Extremity Assessment Upper Extremity Assessment: Overall WFL for tasks assessed   Lower Extremity Assessment Lower Extremity Assessment: Defer to PT evaluation   Cervical / Trunk Assessment Cervical / Trunk Assessment: Normal   Communication Communication Communication: Impaired Factors Affecting Communication: Other (comment)   Cognition Arousal: Alert Behavior During Therapy: WFL for tasks assessed/performed Cognition: No apparent impairments             Following commands: Intact       Cueing  General Comments   Cueing Techniques: Verbal cues  HR 125 bpm, SpO2 as low as 84-85% on room air with gait, pursed lip breathing & static standing x1 minute to increase to 88% -- nurse made aware           Home Living  Family/patient expects to be discharged to:: Private residence Living Arrangements: Alone   Type of Home: Apartment Home Access: Stairs to enter [employer] of Steps: flight Entrance Stairs-Rails: Right;Left Home Layout: One level     Bathroom Shower/Tub: Chief Strategy Officer: Standard     Home Equipment: None          Prior Functioning/Environment Prior Level of Function : Independent/Modified Independent             Mobility Comments: ambulatory without AD, denies falls, uses the  bus for transportation      OT Problem List: Decreased strength;Decreased range of motion;Decreased activity tolerance;Impaired balance (sitting and/or standing);Decreased safety awareness;Decreased knowledge of use of DME or AE;Decreased knowledge of precautions   OT Treatment/Interventions: Self-care/ADL training;Therapeutic exercise;Energy conservation;Therapeutic activities;Patient/family education      OT Goals(Current goals can be found in the care plan section)   Acute Rehab OT Goals Patient Stated Goal: home OT Goal Formulation: With patient Time For Goal Achievement: 10/12/23 Potential to Achieve Goals: Good ADL Goals Additional ADL Goal #1: Pt will recall 5 Ps of energy conservation to imrpove safety with ADL/IADLs at discharge   OT Frequency:  Min 1X/week    Co-evaluation   Reason for Co-Treatment: Other (comment) (ASL interpreter appointment time) PT goals addressed during session: Mobility/safety with mobility;Balance        AM-PAC OT 6 Clicks Daily Activity     Outcome Measure Help from another person eating meals?: None Help from another person taking care of personal grooming?: None Help from another person toileting, which includes using toliet, bedpan, or urinal?: None Help from another person bathing (including washing, rinsing, drying)?: None Help from another person to put on and taking off regular upper body clothing?: None Help from another person to put on and taking off regular lower body clothing?: None 6 Click Score: 24   End of Session Nurse Communication: Mobility status (low SpO2)  Activity Tolerance: Patient tolerated treatment well Patient left: in bed  OT Visit Diagnosis: Unsteadiness on feet (R26.81);Other abnormalities of gait and mobility (R26.89);Muscle weakness (generalized) (M62.81)                Time: 8783-8764 OT Time Calculation (min): 19 min Charges:  OT General Charges $OT Visit: 1 Visit OT Evaluation $OT Eval Low  Complexity: 1 Low  Lucie Kendall, OTR/L Acute Rehabilitation Services Office [phone] Secure Chat Communication Preferred   Lucie JONETTA Kendall 09/28/2023, 2:41 PM

## 2023-09-28 NOTE — TOC CM/SW Note (Signed)
 Transition of Care [facility]) - Inpatient Brief Assessment   Patient Details  Name: Alan Mendoza MRN: [id number] Date of Birth: [birth year]  Transition of Care [facility]) CM/SW Contact:    Lauraine FORBES Saa, LCSWA Phone Number: 09/28/2023, 9:18 AM   Clinical Narrative:  9:18 AM Per chart review, patient resides at home alone. Patient has a PCP and insurance. Patient has SNF history with [facility]. Patient has HH history with [facility]. Patient has DME (single point cane) history. Patient's preferred pharmacy's are [facility] and [facility]. No TOC needs identified at this time. TOC will continue to follow and be available to assist.  Transition of Care Asessment: Insurance and Status: Insurance coverage has been reviewed Patient has primary care physician: Yes Home environment has been reviewed: Private Residence Prior level of function:: N/A Prior/Current Home Services: No current home services Social Drivers of Health Review: SDOH reviewed no interventions necessary Readmission risk has been reviewed: Yes (Currently Observation Status) Transition of care needs: no transition of care needs at this time

## 2023-09-28 NOTE — Assessment & Plan Note (Addendum)
 Well-controlled, diagnosed about 12 years ago, well-controlled on metformin  500 mg twice daily and diet modification.  A1c 6.4. - discontinue CBG monitoring while in hospital

## 2023-09-28 NOTE — Progress Notes (Signed)
 PT Cancellation Note  Patient Details Name: Alan Mendoza MRN: [id number] DOB: 22-Mar-1965   Cancelled Treatment:    Reason Eval/Treat Not Completed: Other (comment) PT orders received, chart reviewed. PT evaluation pending ASL interpreter availability (planning for PM appointment).   Alan Mendoza, PT, DPT 09/28/23, 10:28 AM   Alan Mendoza 09/28/2023, 10:27 AM

## 2023-09-28 NOTE — Telephone Encounter (Signed)
 Patient Product/process development scientist completed.    The patient is insured through [facility]. Patient has Medicare and is not eligible for a copay card, but may be able to apply for patient assistance or Medicare RX Payment Plan (Patient Must reach out to their plan, if eligible for payment plan), if available.    Ran test claim for Breztri  [id number] mcg and the current 30 day co-pay is $0.00.   This test claim was processed through [facility]- copay amounts may vary at other pharmacies due to pharmacy/plan contracts, or as the patient moves through the different stages of their insurance plan.     Reyes Sharps, CPHT Pharmacy Technician III Certified Patient Advocate [facility] Patient Advocate Team Direct Number: [phone]  Fax: [phone]

## 2023-09-28 NOTE — Assessment & Plan Note (Addendum)
 CAD-continue aspirin  and atorvastatin  Tobacco use disorder-interested in receiving resources at discharge Sensorineural hearing loss-use ASL interpreter MDD-previously treated with duloxetine 

## 2023-09-29 DIAGNOSIS — J441 Chronic obstructive pulmonary disease with (acute) exacerbation: Secondary | ICD-10-CM | POA: Diagnosis not present

## 2023-09-29 MED ORDER — REVEFENACIN 175 MCG/3ML IN SOLN
175.0000 ug | Freq: Every day | RESPIRATORY_TRACT | Status: DC
  Administered 2023-09-29 – 2023-09-30 (×2): 175 ug via RESPIRATORY_TRACT
  Filled 2023-09-29 (×3): qty 3

## 2023-09-29 MED ORDER — IPRATROPIUM-ALBUTEROL 0.5-2.5 (3) MG/3ML IN SOLN
3.0000 mL | Freq: Four times a day (QID) | RESPIRATORY_TRACT | Status: DC | PRN
  Filled 2023-09-29: qty 3

## 2023-09-29 MED ORDER — IPRATROPIUM-ALBUTEROL 0.5-2.5 (3) MG/3ML IN SOLN: 3.0000 mL | Freq: Four times a day (QID) | RESPIRATORY_TRACT | Status: DC

## 2023-09-29 MED ORDER — IPRATROPIUM-ALBUTEROL 0.5-2.5 (3) MG/3ML IN SOLN
3.0000 mL | RESPIRATORY_TRACT | Status: DC
  Administered 2023-09-29 – 2023-09-30 (×6): 3 mL via RESPIRATORY_TRACT
  Filled 2023-09-29 (×5): qty 3

## 2023-09-29 NOTE — Plan of Care (Signed)

## 2023-09-29 NOTE — Assessment & Plan Note (Addendum)
 Stable this morning, cough is the main symptom at this point.  Still primarily suspect exacerbation 2/2 viral infection and improper medication compliance.  Did have some reported desaturations with PT. - De-escalate DuoNebs to every 6 hours and continue to monitor for improvement - Albuterol  nebulizer every 6 hours PRN, used once this morning. - Continue Breztri  twice daily - Continue cefuroxime  end date 9/29, (ceftriaxone  1 g daily 9/25 - 9/26) - Continue revefenacin, only received 1 dose this morning - Continue Prednisone  to complete 5-day course 9/25-9/29 - Ambulatory pulse ox test, will add on home oxygen if indicated based on results

## 2023-09-29 NOTE — Assessment & Plan Note (Addendum)
 Persistent presentation in ED, most likely secondary to excessive albuterol  treatment but HR much improved yesterday p.m. and today, some stabbing chest pain yesterday, but no symptoms today - CT PE negative

## 2023-09-29 NOTE — Progress Notes (Signed)
 This RN gave tesslon and prn albuterol  tx

## 2023-09-29 NOTE — Assessment & Plan Note (Addendum)
 LBM 9/22, has not had bowel movement in the hospital.  History of chronic idiopathic constipation for several years, no current bowel regimen at home.  States he never received the enema ordered on admission, Significant stool burden on CTAP - Received milk of magnesia yesterday in the a.m. - Senna twice daily, MiraLAX  twice daily - Soap suds enema today

## 2023-09-29 NOTE — Assessment & Plan Note (Signed)
 CAD-continue aspirin  and atorvastatin  Tobacco use disorder-interested in receiving resources at discharge Sensorineural hearing loss-use ASL interpreter T2DM-holding metformin , will resume at discharge MDD-previously treated with duloxetine 

## 2023-09-29 NOTE — Plan of Care (Signed)

## 2023-09-29 NOTE — Progress Notes (Signed)
 Contacted respiratory for eval of pt's cough. Pt called this RN into room and wrote on paper that he is having a tough time with the cough. Prn tesslon given at start of shift. Pt had breathing tx per mar. Paged respiratory

## 2023-09-29 NOTE — Progress Notes (Signed)
 SATURATION QUALIFICATIONS: (This note is used to comply with regulatory documentation for home oxygen)  Patient Saturations on Room Air at Rest = 93%  Patient Saturations on Room Air while Ambulating = 93%  Patient Saturations on room air Liters of oxygen while Ambulating = 93%  Please briefly explain why patient needs home oxygen:

## 2023-09-29 NOTE — Progress Notes (Signed)
 Daily Progress Note Intern Pager: [phone]  Patient name: Alan Mendoza Medical record number: [id number] Date of birth: [birth year] Age: 58 y.o. Gender: male  Primary Care Provider: Maree Leni Edyth DELENA, MD Consultants: None Code Status: DNR-I  Pt Overview and Major Events to Date:  9/25: admitted to FMTS   Medical Decision Making: Alan Mendoza is a 58 y.o. male with a PMH of COPD, CAD s/p stent 2014, sensorineural hearing loss, Tobacco use disorder, T2DM, and MDD who was admitted to the hospital for a COPD exacerbation.  Assessment & Plan COPD with acute exacerbation (HCC) Stable this morning, cough is the main symptom at this point.  Still primarily suspect exacerbation 2/2 viral infection and improper medication compliance.  Did have some reported desaturations with PT. - De-escalate DuoNebs to every 6 hours and continue to monitor for improvement - Albuterol  nebulizer every 6 hours PRN, used once this morning. - Continue Breztri  twice daily - Continue cefuroxime  end date 9/29, (ceftriaxone  1 g daily 9/25 - 9/26) - Continue revefenacin, only received 1 dose this morning - Continue Prednisone  to complete 5-day course 9/25-9/29 - Ambulatory pulse ox test, will add on home oxygen if indicated based on results Chronic constipation LBM 9/22, has not had bowel movement in the hospital.  History of chronic idiopathic constipation for several years, no current bowel regimen at home.  States he never received the enema ordered on admission, Significant stool burden on CTAP - Received milk of magnesia yesterday in the a.m. - Senna twice daily, MiraLAX  twice daily - Soap suds enema today Sinus tachycardia (Resolved: 09/29/2023) Persistent presentation in ED, most likely secondary to excessive albuterol  treatment but HR much improved yesterday p.m. and today, some stabbing chest pain yesterday, but no symptoms today - CT PE negative Chronic health problem CAD-continue aspirin  and  atorvastatin  Tobacco use disorder-interested in receiving resources at discharge Sensorineural hearing loss-use ASL interpreter T2DM-holding metformin , will resume at discharge MDD-previously treated with duloxetine    FEN/GI: Heart healthy/carb modified PPx: Lovenox  Dispo:Home pending clinical improvement . Barriers include respiratory status, DuoNebs.   Subjective:  Saw patient at bedside this morning, was resting comfortably in bed.  Used ASL interpreter for encounter.  Patient stated that his cough has been keeping him up at night and he is not sleeping very well.  Besides that he is not having much shortness of breath or dyspnea.  Did report some dyspnea with ambulation yesterday but is ambulating well around the room and with PT.  Denies nausea or vomiting, no sputum production, still has not had a bowel movement.  Also denied ever receiving the enema ordered on admission  Objective: Temp:  [97.9 F (36.6 C)-98.7 F (37.1 C)] 98.1 F (36.7 C) (09/27 0742) Pulse Rate:  [79-109] 89 (09/27 0856) Resp:  [16-20] 18 (09/27 0856) BP: (113-144)/(76-89) 144/77 (09/27 0742) SpO2:  [91 %-98 %] 96 % (09/27 0856) Physical Exam: General: Well-appearing, resting comfortably, no acute distress Cardiovascular: Regular rate rhythm, no murmurs rubs or gallops Respiratory: Diffuse expiratory wheezes, no crackles or rhonchi Abdomen: Active bowel sounds, left lower quadrant tenderness, moderate abdominal distention, no rebound or guarding Extremities: Nonedematous, nontender  Laboratory: Most recent CBC Lab Results  Component Value Date   WBC 6.9 09/28/2023   HGB 13.5 09/28/2023   HCT 43.3 09/28/2023   MCV 87.8 09/28/2023   PLT 241 09/28/2023   Most recent BMP    Latest Ref Rng & Units 09/28/2023    5:00 AM  BMP  Glucose  70 - 99 mg/dL 891   BUN 6 - 20 mg/dL 15   Creatinine 9.38 - 1.24 mg/dL 9.13   Sodium 864 - 854 mmol/L 139   Potassium 3.5 - 5.1 mmol/L 3.7   Chloride 98 - 111 mmol/L  105   CO2 22 - 32 mmol/L 20   Calcium  8.9 - 10.3 mg/dL 8.9      Imaging/Diagnostic Tests: CTPE 9/26 IMPRESSION: 1. No evidence of significant pulmonary embolus. 2. Emphysematous changes and chronic bronchitic changes in the lungs with mucous plugging. No focal consolidation. 3. Mild aortic atherosclerosis.  Lorrane Pac, MD 09/29/2023, 9:37 AM  PGY-1, [facility] Family Medicine FPTS Intern pager: [phone], text pages welcome Secure chat group [facility] Teaching Service

## 2023-09-30 ENCOUNTER — Other Ambulatory Visit (HOSPITAL_COMMUNITY): Payer: Self-pay

## 2023-09-30 DIAGNOSIS — J441 Chronic obstructive pulmonary disease with (acute) exacerbation: Secondary | ICD-10-CM | POA: Diagnosis not present

## 2023-09-30 MED ORDER — GUAIFENESIN-DM 100-10 MG/5ML PO SYRP
5.0000 mL | ORAL_SOLUTION | Freq: Once | ORAL | Status: AC
Start: 2023-09-30 — End: 2023-09-30
  Administered 2023-09-30: 5 mL via ORAL
  Filled 2023-09-30: qty 5

## 2023-09-30 MED ORDER — SENNA 8.6 MG PO TABS
1.0000 | ORAL_TABLET | Freq: Two times a day (BID) | ORAL | 0 refills | Status: AC
  Filled 2023-09-30: qty 60, 30d supply, fill #0

## 2023-09-30 MED ORDER — POLYETHYLENE GLYCOL 3350 17 GM/SCOOP PO POWD
17.0000 g | Freq: Two times a day (BID) | ORAL | 0 refills | Status: AC | PRN
  Filled 2023-09-30: qty 510, 15d supply, fill #0

## 2023-09-30 NOTE — Plan of Care (Signed)

## 2023-09-30 NOTE — Progress Notes (Signed)
     Daily Progress Note Intern Pager: [phone]  Patient name: Alan Mendoza Medical record number: [id number] Date of birth: 1965/09/17 Age: 58 y.o. Gender: male  Primary Care Provider: Maree Leni Edyth DELENA, MD Consultants: None Code Status: DNR-I  Pt Overview and Major Events to Date:  9/25: admitted to FMTS   Alan Mendoza is a 58 y.o. male with a PMH of COPD, CAD s/p stent 2014, sensorineural hearing loss, Tobacco use disorder, T2DM, and MDD who was admitted to the hospital for a COPD exacerbation.  Assessment & Plan COPD with acute exacerbation (HCC) Stable this morning, cough improved. Still primarily suspect exacerbation 2/2 viral infection and improper medication compliance.   - Scheduled DuoNebs every 4 hours and continue to monitor for improvement - Continue Breztri  twice daily - Continue cefuroxime  end date 9/29, (ceftriaxone  1 g daily 9/25 - 9/26) - Continue revefenacin - Continue Prednisone  to complete 5-day course 9/25-9/29 Chronic constipation Had bowel movement after soap suds enema yesterday. History of chronic idiopathic constipation for several years, no current bowel regimen at home.   - Senna twice daily, MiraLAX  twice daily, Milk of Magnesia Chronic health problem CAD-continue aspirin  and atorvastatin  Tobacco use disorder-interested in receiving resources at discharge Sensorineural hearing loss-use ASL interpreter T2DM-holding metformin , will resume at discharge MDD-previously treated with duloxetine   FEN/GI: Heart healthy/carb modified PPx: Lovenox  Dispo:Home pending clinical improvement. Barriers include respiratory status, DuoNebs.  Subjective:  Patient seen at bedside. Reports cough is better today. Had a BM yesterday after soap suds enema. Asks about discharge. States he does not like Symbicort  inhaler because he has to use it more than twice a day. He does like Trelegy inhaler. Asks if he can have another medication besides Symbicort .  Objective: Temp:   [97.9 F (36.6 C)-98.2 F (36.8 C)] 98.2 F (36.8 C) (09/27 2010) Pulse Rate:  [86-95] 86 (09/28 0724) Resp:  [16-18] 18 (09/28 0724) BP: (119-144)/(77-84) 119/84 (09/27 2010) SpO2:  [95 %-98 %] 97 % (09/28 0724)  Physical Exam: General: resting comfortably in bed, NAD Cardiovascular: RRR; no m/r/g Respiratory: diffuse wheezing; normal respiratory effort on RA;  Abdomen: non-tender, non-distended; normal bowel sounds Extremities: moving all 4 extremities equally  Laboratory: Most recent CBC Lab Results  Component Value Date   WBC 6.9 09/28/2023   HGB 13.5 09/28/2023   HCT 43.3 09/28/2023   MCV 87.8 09/28/2023   PLT 241 09/28/2023   Most recent BMP    Latest Ref Rng & Units 09/28/2023    5:00 AM  BMP  Glucose 70 - 99 mg/dL 891   BUN 6 - 20 mg/dL 15   Creatinine 9.38 - 1.24 mg/dL 9.13   Sodium 864 - 854 mmol/L 139   Potassium 3.5 - 5.1 mmol/L 3.7   Chloride 98 - 111 mmol/L 105   CO2 22 - 32 mmol/L 20   Calcium  8.9 - 10.3 mg/dL 8.9     Imaging/Diagnostic Tests: No new imaging  Mannie Ashley SAILOR, MD 09/30/2023, 7:27 AM  PGY-1, [place] Family Medicine FPTS Intern pager: [phone], text pages welcome Secure chat group [facility] Teaching Service

## 2023-09-30 NOTE — Assessment & Plan Note (Signed)
 CAD-continue aspirin  and atorvastatin  Tobacco use disorder-interested in receiving resources at discharge Sensorineural hearing loss-use ASL interpreter T2DM-holding metformin , will resume at discharge MDD-previously treated with duloxetine 

## 2023-09-30 NOTE — Assessment & Plan Note (Addendum)
 Stable this morning, cough improved. Still primarily suspect exacerbation 2/2 viral infection and improper medication compliance.   - Scheduled DuoNebs every 4 hours and continue to monitor for improvement - Continue Breztri  twice daily - Continue cefuroxime  end date 9/29, (ceftriaxone  1 g daily 9/25 - 9/26) - Continue revefenacin - Continue Prednisone  to complete 5-day course 9/25-9/29

## 2023-09-30 NOTE — Discharge Instructions (Addendum)
 Dear Alan Mendoza,  Thank you for letting us  participate in your care. You were hospitalized for shortness of breath and wheezing and diagnosed with COPD with acute exacerbation (HCC). You were treated with inhalers, antibiotics, and steroids.  All of this helped improve your symptoms.  You are also given a bowel regimen to help with your constipation.   Home Medications: - Trelegy switched to Breztri  which you will take twice daily.  Stop taking your Symbicort  and Trelegy. - Take albuterol  as needed for wheezing - Complete your antibiotics (cefuroxime ) 500 mg twice a day, continue this till 10/1 - Complete your steroid (prednisone ) 40 mg daily for 1 more day, to complete 9/29  POST-HOSPITAL & CARE INSTRUCTIONS Go to your follow up appointments (listed below)   DOCTOR'S APPOINTMENT   No future appointments.  Follow-up Information     Maree Leni Edyth DELENA, MD. Schedule an appointment as soon as possible for a visit.   Specialty: Family Medicine Contact information: [address] [place] [address] [phone]                 Take care and be well!  Family Medicine Teaching Service Inpatient Team [place]  [facility]  [address] [place], [place] [address] [phone]

## 2023-09-30 NOTE — Plan of Care (Signed)
  Problem: Education: Goal: Knowledge of General Education information will improve Description: Including pain rating scale, medication(s)/side effects and non-pharmacologic comfort measures Outcome: Adequate for Discharge   Problem: Health Behavior/Discharge Planning: Goal: Ability to manage health-related needs will improve Outcome: Adequate for Discharge   Problem: Clinical Measurements: Goal: Ability to maintain clinical measurements within normal limits will improve Outcome: Adequate for Discharge Goal: Will remain free from infection Outcome: Adequate for Discharge Goal: Diagnostic test results will improve Outcome: Adequate for Discharge Goal: Respiratory complications will improve Outcome: Adequate for Discharge Goal: Cardiovascular complication will be avoided Outcome: Adequate for Discharge   Problem: Activity: Goal: Risk for activity intolerance will decrease Outcome: Adequate for Discharge   Problem: Nutrition: Goal: Adequate nutrition will be maintained Outcome: Adequate for Discharge   Problem: Coping: Goal: Level of anxiety will decrease Outcome: Adequate for Discharge   Problem: Elimination: Goal: Will not experience complications related to bowel motility Outcome: Adequate for Discharge Goal: Will not experience complications related to urinary retention Outcome: Adequate for Discharge   Problem: Pain Managment: Goal: General experience of comfort will improve and/or be controlled Outcome: Adequate for Discharge   Problem: Safety: Goal: Ability to remain free from injury will improve Outcome: Adequate for Discharge   Problem: Skin Integrity: Goal: Risk for impaired skin integrity will decrease Outcome: Adequate for Discharge   Problem: Education: Goal: Ability to describe self-care measures that may prevent or decrease complications (Diabetes Survival Skills Education) will improve Outcome: Adequate for Discharge Goal: Individualized Educational  Video(s) Outcome: Adequate for Discharge   Problem: Coping: Goal: Ability to adjust to condition or change in health will improve Outcome: Adequate for Discharge   Problem: Fluid Volume: Goal: Ability to maintain a balanced intake and output will improve Outcome: Adequate for Discharge   Problem: Health Behavior/Discharge Planning: Goal: Ability to identify and utilize available resources and services will improve Outcome: Adequate for Discharge Goal: Ability to manage health-related needs will improve Outcome: Adequate for Discharge   Problem: Metabolic: Goal: Ability to maintain appropriate glucose levels will improve Outcome: Adequate for Discharge   Problem: Nutritional: Goal: Maintenance of adequate nutrition will improve Outcome: Adequate for Discharge Goal: Progress toward achieving an optimal weight will improve Outcome: Adequate for Discharge   Problem: Skin Integrity: Goal: Risk for impaired skin integrity will decrease Outcome: Adequate for Discharge   Problem: Tissue Perfusion: Goal: Adequacy of tissue perfusion will improve Outcome: Adequate for Discharge   Problem: Acute Rehab PT Goals(only PT should resolve) Goal: Pt Will Ambulate Outcome: Adequate for Discharge Goal: Pt Will Go Up/Down Stairs Outcome: Adequate for Discharge   Problem: Acute Rehab OT Goals (only OT should resolve) Goal: OT Additional ADL Goal #1 Outcome: Adequate for Discharge

## 2023-09-30 NOTE — Assessment & Plan Note (Addendum)
 Had bowel movement after soap suds enema yesterday. History of chronic idiopathic constipation for several years, no current bowel regimen at home.   - Senna twice daily, MiraLAX  twice daily, Milk of Magnesia
